# Patient Record
Sex: Female | Born: 1985 | Race: Black or African American | Hispanic: No | Marital: Married | Smoking: Never smoker
Health system: Southern US, Community
[De-identification: ages and names within clinical notes are randomized; demographics above are authoritative.]

## PROBLEM LIST (undated history)

## (undated) DIAGNOSIS — O1493 Unspecified pre-eclampsia, third trimester: Secondary | ICD-10-CM

## (undated) DIAGNOSIS — N83209 Unspecified ovarian cyst, unspecified side: Secondary | ICD-10-CM

## (undated) DIAGNOSIS — N809 Endometriosis, unspecified: Secondary | ICD-10-CM

## (undated) DIAGNOSIS — D259 Leiomyoma of uterus, unspecified: Secondary | ICD-10-CM

---
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
## 2011-03-15 ENCOUNTER — Emergency Department (HOSPITAL_COMMUNITY): Payer: Managed Care, Other (non HMO)

## 2011-03-15 ENCOUNTER — Emergency Department (HOSPITAL_COMMUNITY)
Admission: EM | Admit: 2011-03-15 | Discharge: 2011-03-15 | Disposition: A | Discharge status: Other Acute Inpatient Hospital | Payer: Managed Care, Other (non HMO) | Source: Home / Self Care | Attending: Emergency Medicine | Admitting: Emergency Medicine

## 2011-03-15 ENCOUNTER — Ambulatory Visit (HOSPITAL_COMMUNITY)
Admission: AD | Admit: 2011-03-15 | Discharge: 2011-03-16 | Disposition: A | Discharge status: Home / Self Care | Payer: Managed Care, Other (non HMO) | Source: Ambulatory Visit | Attending: General Surgery | Admitting: General Surgery

## 2011-03-15 ENCOUNTER — Other Ambulatory Visit (INDEPENDENT_AMBULATORY_CARE_PROVIDER_SITE_OTHER): Payer: Self-pay | Admitting: General Surgery

## 2011-03-15 DIAGNOSIS — Z01812 Encounter for preprocedural laboratory examination: Secondary | ICD-10-CM | POA: Insufficient documentation

## 2011-03-15 DIAGNOSIS — K3580 Unspecified acute appendicitis: Secondary | ICD-10-CM | POA: Insufficient documentation

## 2011-03-15 DIAGNOSIS — R109 Unspecified abdominal pain: Secondary | ICD-10-CM

## 2011-03-15 DIAGNOSIS — Z01810 Encounter for preprocedural cardiovascular examination: Secondary | ICD-10-CM | POA: Insufficient documentation

## 2011-03-15 HISTORY — PX: APPENDECTOMY: SHX54

## 2011-03-15 LAB — COMPREHENSIVE METABOLIC PANEL
Albumin: 4.3 g/dL (ref 3.5–5.2)
BUN: 8 mg/dL (ref 6–23)
Calcium: 9.5 mg/dL (ref 8.4–10.5)
Chloride: 104 mEq/L (ref 96–112)
Creatinine, Ser: 0.83 mg/dL (ref 0.50–1.10)
Total Bilirubin: 0.6 mg/dL (ref 0.3–1.2)

## 2011-03-15 LAB — CBC
HCT: 35 % — ABNORMAL LOW (ref 36.0–46.0)
MCH: 29.1 pg (ref 26.0–34.0)
MCHC: 32.3 g/dL (ref 30.0–36.0)
MCV: 90.2 fL (ref 78.0–100.0)
RDW: 12.7 % (ref 11.5–15.5)
WBC: 17.7 10*3/uL — ABNORMAL HIGH (ref 4.0–10.5)

## 2011-03-15 LAB — URINE MICROSCOPIC-ADD ON

## 2011-03-15 LAB — URINALYSIS, ROUTINE W REFLEX MICROSCOPIC
Bilirubin Urine: NEGATIVE
Glucose, UA: NEGATIVE mg/dL
Ketones, ur: 40 mg/dL — AB
Protein, ur: 30 mg/dL — AB
pH: 5.5 (ref 5.0–8.0)

## 2011-03-15 LAB — DIFFERENTIAL
Eosinophils Relative: 0 % (ref 0–5)
Lymphocytes Relative: 5 % — ABNORMAL LOW (ref 12–46)
Lymphs Abs: 0.9 10*3/uL (ref 0.7–4.0)
Monocytes Absolute: 1.2 10*3/uL — ABNORMAL HIGH (ref 0.1–1.0)
Monocytes Relative: 7 % (ref 3–12)

## 2011-03-15 LAB — LIPASE, BLOOD: Lipase: 12 U/L (ref 11–59)

## 2011-03-15 LAB — GC/CHLAMYDIA PROBE AMP, GENITAL: GC Probe Amp, Genital: NEGATIVE

## 2011-03-15 LAB — PREGNANCY, URINE: Preg Test, Ur: NEGATIVE

## 2011-03-15 MED ORDER — IOHEXOL 300 MG/ML  SOLN
100.0000 mL | Freq: Once | INTRAMUSCULAR | Status: AC | PRN
  Administered 2011-03-15: 100 mL via INTRAVENOUS

## 2011-03-15 NOTE — Op Note (Signed)
[name], [name]             ACCOUNT NO.:  [id number]  MEDICAL RECORD NO.:  [id number]  LOCATION:  SDS                          FACILITY:  [facility]  PHYSICIAN:  Cherylynn Ridges, M.D.    DATE OF BIRTH:  1985/12/11  DATE OF PROCEDURE:  03/15/2011 DATE OF DISCHARGE:                              OPERATIVE REPORT   PREOPERATIVE DIAGNOSIS:  Acute appendicitis.  POSTOPERATIVE DIAGNOSIS:  Acute appendicitis.  PROCEDURE:  Laparoscopic appendectomy.  SURGEON:  Cherylynn Ridges, MD  ANESTHESIA:  General endotracheal.  ESTIMATED BLOOD LOSS:  Less than 20 mL.  No complications.  CONDITION:  Stable.  INDICATION FOR OPERATION:  The patient is a 25 year old otherwise healthy female with recent abdominal pain localized to the right lower quadrant who now comes in for a laparoscopic appendectomy.  OPERATION:  The patient was taken to the operating room, placed on table in supine position.  After an adequate general endotracheal anesthetic was administered, she was prepped and draped in usual sterile manner exposing the entire abdomen.  After proper time-out was performed identifying the patient and procedure to be performed, a supraumbilical midline incision was made using #15 blade and taken down to the midline fascia.  We incised the fascia with a 15 blade, then grabbed the edges with Kocher clamps.  We bluntly dissected down into the peritoneal cavity using a Kelly clamp. Once this was done, a pursestring suture of 0 Vicryl was passed around the fascial opening which secured in the Hasson cannula.  Through the Hasson cannula, we insufflated carbon dioxide gas up to a maximal intra- abdominal pressure of 15 mmHg.  Once that was done, a right upper quadrant 5 mm cannula and a left lower quadrant 11-12 mm cannula were passed under direct vision into the peritoneal cavity.  The patient was placed in Trendelenburg, left side was tilted down.  The appendix was actually adhesed to the  anterior abdominal wall from the acute inflammation.  There was also some omental covering of which we dissected away.  We detached the appendix from the base of the cecum using dissect and then came across the base of the appendix using a Endo- GIA 3.5 mm closure stapler.  We then had just a dangling mesoappendix which we came across using a 2.5 mm Endo-GIA.  This completely detached the appendix from the underlying structures.  We retrieved it from the left lower quadrant using the EndoCatch bag.  Once this was done, we inspected the right lower quadrant and irrigated for bleeding and there was none noted.  We aspirated all fluid and gas from above the liver, flattening the patient, then removed all cannulas.  The supraumbilical fascia site was closed using the pursestring suture which was in place.  0.25% Marcaine with epi was injected at all sites. During the processes the surgeon was stuck with a needle and appropriate studies were sent.  After injection, we closed the left lower quadrant and the supraumbilical site using running subcuticular stitch of 4-0 Monocryl. Dermabond, Steri-Strips, and Tegaderm were used to complete all of the dressings.     Cherylynn Ridges, M.D.     JOW/MEDQ  D:  03/15/2011  T:  03/15/2011  Job:  [id number]  Electronically Signed by Jimmye Norman M.D. on 03/15/2011 04:53:22 PM

## 2011-03-18 NOTE — H&P (Signed)
[name], [name]             ACCOUNT NO.:  [id number]  MEDICAL RECORD NO.:  [id number]  LOCATION:  WLED                         FACILITY:  [facility]  PHYSICIAN:  [name], MDDATE OF BIRTH:  09-13-1985  DATE OF ADMISSION:  03/15/2011 DATE OF DISCHARGE:                             HISTORY & PHYSICAL   REFERRING PHYSICIAN:  Sunnie Nielsen, MD, ER.  PRIMARY CARE:  [facility] in [place].  CHIEF COMPLAINT:  Abdominal pain.  BRIEF HISTORY:  The patient is a healthy 25 year old African American female who developed abdominal pain yesterday morning.  She thought it was secondary to onset of her menstrual cycle.  She took some naproxen in the morning.  She went to work and did her usual stuff at school. She came home, was still having pain and took another 800 of ibuprofen. She felt worse, but went to church, but when she got worse and her pain was so bad, she had to go out.  She came home and took more naproxen. Then, she developed nausea and vomiting about 10 p.m., which lasted until 2 a.m.  She also developed some diarrhea yesterday evening.  She also came to the ER around 03:30 this morning.  Workup in the ER shows a white count of 17,700, hemoglobin is 11, hematocrit is 35, platelets of 230,000.  BUN is 8, creatinine is 0.83.  LFTs are normal.  Lipase was 12.  UA showed TNTC RBC.  CT scan shows an appendicolith in the right lower quadrant.  The appendix is dilated and fluid-filled, but there is no abscess.  She also has a 56-mm uterine fibroid.  We are asked to see by the ER for acute appendicitis.  PAST MEDICAL HISTORY:  None.  This is the first time she has ever been in the hospital.  PAST SURGICAL HISTORY:  None.  FAMILY HISTORY:  Both parents and 4 sisters are all living in good health.  SOCIAL HISTORY:  Tobacco, none.  Alcohol, none.  Drugs, none.  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 She is  single.  REVIEW OF SYSTEMS:  FEVER: None.  CV:  No headaches, syncope, dizziness, stroke, or seizure.  SKIN:  No changes.  WEIGHT:  No changes.  She was eating normally until last night.  CARDIAC:  No chest pain or palpitations.  PULMONARY:  No orthopnea, PND, or dyspnea on exertion. GI:  Negative for GERD.  She had some diarrhea last night, but that is now normal.  Positive for nausea and vomiting last night.  She has had some stool with some blood noted in the past, which she attributes to hemorrhoids.  GYN:  She started her cycle yesterday morning with her abdominal pain.  GU:  No trouble voiding.  LOWER EXTREMITIES:  No edema or claudication.  MUSCULOSKELETAL:  Normal.  PSYCH: No changes.  CURRENT MEDICATIONS:  Ibuprofen and Naprosyn p.r.n.  ALLERGIES:  None.  PHYSICAL EXAM:  GENERAL:  This is a well-nourished, well-developed 83- year-old female, in no acute distress.  She is not having any pain currently.  It does hurt when she moves around.  VITAL SIGNS: Temperature is 98.7, heart  rate is 98, blood pressure 121/67, respiratory rate is 18, sats are 98% on room air. HEAD:  Normocephalic. EARS, NOSE, THROAT, AND MOUTH:  Within normal limits.  Trachea is in the midline.  Thyroid is not palpable.  There is no JVD, no bruits. Respiratory effort is normal.  CHEST:  Clear to auscultation and percussion.  CARDIAC:  Normal S1 and S2.  No S3, S4, murmurs, or rubs. EXTREMITIES:  Pulses are +2 in the upper and lower extremities. ABDOMEN:  Bowel sounds are present.  She is not distended.  She is tender in the right lower quadrant, quickly points to that as her only source of discomfort.  Hernias, none.  Masses, none.  Abscesses, none. GU/RECTAL:  Deferred. MUSCULOSKELETAL:  Normal 25 year old. SKIN:  No changes. NEUROLOGIC:  No focal deficits.  Cranial nerves are intact. PSYCH:  Normal affect.  UA was inconclusive because she had TNTC RBC.  WBC is 17.7, hemoglobin 11, hematocrit 35,  platelets 230,000, lipase 12.  Sodium is 139, potassium is 3.7, chloride is 104, CO2 is 25, BUN is 8, creatinine is 0.83, glucose 100.  LFTs are normal.  CT as noted above.  IMPRESSION:  Acute appendicitis.  PLAN:  She has been started on Unasyn.  We will continue that.  We will continue to keep her n.p.o.  We will plan transfer straight to the OR at [facility] for surgery by Dr. Lindie Spruce.  The risks and benefits were discussed in detail and she is agreeable.     Eber Hong, P.A.   ______________________________ Emily Gosling, MD    WDJ/MEDQ  D:  03/15/2011  T:  03/15/2011  Job:  [id number]  cc:   [facility] in [place]  Electronically Signed by Sherrie George P.A. on 03/17/2011 03:07:09 PM Electronically Signed by Emelia Loron MD on 03/18/2011 08:06:53 PM

## 2011-03-20 ENCOUNTER — Encounter (INDEPENDENT_AMBULATORY_CARE_PROVIDER_SITE_OTHER): Payer: Self-pay

## 2011-03-22 ENCOUNTER — Encounter (INDEPENDENT_AMBULATORY_CARE_PROVIDER_SITE_OTHER): Payer: Self-pay

## 2011-04-10 ENCOUNTER — Ambulatory Visit (INDEPENDENT_AMBULATORY_CARE_PROVIDER_SITE_OTHER): Payer: Managed Care, Other (non HMO) | Admitting: General Surgery

## 2011-04-10 ENCOUNTER — Encounter (INDEPENDENT_AMBULATORY_CARE_PROVIDER_SITE_OTHER): Payer: Self-pay | Admitting: General Surgery

## 2011-04-10 VITALS — BP 118/84 | HR 60 | Temp 97.1°F | Resp 12 | Ht 63.0 in | Wt 163.0 lb

## 2011-04-10 DIAGNOSIS — Z09 Encounter for follow-up examination after completed treatment for conditions other than malignant neoplasm: Secondary | ICD-10-CM | POA: Insufficient documentation

## 2011-04-10 NOTE — Progress Notes (Signed)
HPI The patient is doing well status post laparoscopic appendectomy.  PE Her wounds have healed well with no evidence of infection.  Studiy review None  Assessment Doing well status post laparoscopic appendectomy.  Plan Return to me on a p.r.n. basis.

## 2011-05-02 ENCOUNTER — Telehealth (INDEPENDENT_AMBULATORY_CARE_PROVIDER_SITE_OTHER): Payer: Self-pay | Admitting: General Surgery

## 2011-05-02 NOTE — Telephone Encounter (Signed)
Patient status post appendectomy at the end of October, she has been fine but this morning had some sharp abdominal pain in her mid lower abdomen right under her belly button. She also had some nausea and vomited once. Her nausea is gone. She took a vicodin and it helped with pain but still having intermittent sharp pains. She does not have a primary MD. Please advise.

## 2012-08-26 ENCOUNTER — Other Ambulatory Visit: Payer: Self-pay | Admitting: Occupational Medicine

## 2012-08-26 ENCOUNTER — Ambulatory Visit: Payer: Self-pay

## 2012-08-26 DIAGNOSIS — R7612 Nonspecific reaction to cell mediated immunity measurement of gamma interferon antigen response without active tuberculosis: Secondary | ICD-10-CM

## 2012-12-01 ENCOUNTER — Other Ambulatory Visit: Payer: Self-pay | Admitting: Obstetrics and Gynecology

## 2012-12-02 ENCOUNTER — Encounter (HOSPITAL_COMMUNITY): Payer: Self-pay | Admitting: *Deleted

## 2012-12-04 ENCOUNTER — Encounter (HOSPITAL_COMMUNITY): Payer: Self-pay | Admitting: Pharmacy Technician

## 2012-12-08 ENCOUNTER — Other Ambulatory Visit: Payer: Self-pay | Admitting: Obstetrics and Gynecology

## 2012-12-08 DIAGNOSIS — D259 Leiomyoma of uterus, unspecified: Secondary | ICD-10-CM

## 2012-12-08 DIAGNOSIS — N858 Other specified noninflammatory disorders of uterus: Secondary | ICD-10-CM

## 2012-12-08 DIAGNOSIS — N852 Hypertrophy of uterus: Secondary | ICD-10-CM

## 2012-12-10 ENCOUNTER — Ambulatory Visit
Admission: RE | Admit: 2012-12-10 | Discharge: 2012-12-10 | Disposition: A | Discharge status: Home / Self Care | Payer: 59 | Source: Ambulatory Visit | Attending: Obstetrics and Gynecology | Admitting: Obstetrics and Gynecology

## 2012-12-10 DIAGNOSIS — D259 Leiomyoma of uterus, unspecified: Secondary | ICD-10-CM

## 2012-12-10 DIAGNOSIS — N852 Hypertrophy of uterus: Secondary | ICD-10-CM

## 2012-12-10 DIAGNOSIS — N858 Other specified noninflammatory disorders of uterus: Secondary | ICD-10-CM

## 2012-12-10 MED ORDER — GADOBENATE DIMEGLUMINE 529 MG/ML IV SOLN
15.0000 mL | Freq: Once | INTRAVENOUS | Status: AC | PRN
  Administered 2012-12-10: 15 mL via INTRAVENOUS

## 2012-12-11 ENCOUNTER — Ambulatory Visit (HOSPITAL_COMMUNITY)
Admission: RE | Admit: 2012-12-11 | Discharge: 2012-12-12 | Disposition: A | Discharge status: Home / Self Care | Payer: 59 | Source: Ambulatory Visit | Attending: Obstetrics and Gynecology | Admitting: Obstetrics and Gynecology

## 2012-12-11 ENCOUNTER — Encounter (HOSPITAL_COMMUNITY): Payer: Self-pay | Admitting: Anesthesiology

## 2012-12-11 ENCOUNTER — Ambulatory Visit (HOSPITAL_COMMUNITY): Payer: 59 | Admitting: Anesthesiology

## 2012-12-11 ENCOUNTER — Encounter (HOSPITAL_COMMUNITY)
Admission: RE | Disposition: A | Discharge status: Home / Self Care | Payer: Self-pay | Source: Ambulatory Visit | Attending: Obstetrics and Gynecology

## 2012-12-11 DIAGNOSIS — Z9889 Other specified postprocedural states: Secondary | ICD-10-CM

## 2012-12-11 DIAGNOSIS — N803 Endometriosis of pelvic peritoneum, unspecified: Secondary | ICD-10-CM | POA: Insufficient documentation

## 2012-12-11 DIAGNOSIS — N920 Excessive and frequent menstruation with regular cycle: Secondary | ICD-10-CM | POA: Insufficient documentation

## 2012-12-11 DIAGNOSIS — D250 Submucous leiomyoma of uterus: Secondary | ICD-10-CM | POA: Insufficient documentation

## 2012-12-11 DIAGNOSIS — N946 Dysmenorrhea, unspecified: Secondary | ICD-10-CM | POA: Insufficient documentation

## 2012-12-11 DIAGNOSIS — D251 Intramural leiomyoma of uterus: Secondary | ICD-10-CM | POA: Insufficient documentation

## 2012-12-11 HISTORY — DX: Leiomyoma of uterus, unspecified: D25.9

## 2012-12-11 HISTORY — PX: ROBOT ASSISTED MYOMECTOMY: SHX5142

## 2012-12-11 LAB — CBC
MCH: 29.3 pg (ref 26.0–34.0)
Platelets: 248 10*3/uL (ref 150–400)
RBC: 4.34 MIL/uL (ref 3.87–5.11)
RDW: 12.6 % (ref 11.5–15.5)
WBC: 8.7 10*3/uL (ref 4.0–10.5)

## 2012-12-11 LAB — TYPE AND SCREEN: Antibody Screen: NEGATIVE

## 2012-12-11 LAB — PREGNANCY, URINE: Preg Test, Ur: NEGATIVE

## 2012-12-11 LAB — ABO/RH: ABO/RH(D): O POS

## 2012-12-11 SURGERY — ROBOTIC ASSISTED MYOMECTOMY
Anesthesia: General | Site: Abdomen | Wound class: Clean Contaminated

## 2012-12-11 MED ORDER — HYDROMORPHONE HCL PF 1 MG/ML IJ SOLN
0.2000 mg | INTRAMUSCULAR | Status: DC | PRN
  Administered 2012-12-11: 0.6 mg via INTRAVENOUS
  Filled 2012-12-11: qty 1

## 2012-12-11 MED ORDER — DEXTROSE IN LACTATED RINGERS 5 % IV SOLN
INTRAVENOUS | Status: DC
  Administered 2012-12-11: 22:00:00 via INTRAVENOUS

## 2012-12-11 MED ORDER — CEFAZOLIN SODIUM 1-5 GM-% IV SOLN
1.0000 g | Freq: Three times a day (TID) | INTRAVENOUS | Status: AC
  Administered 2012-12-11 – 2012-12-12 (×2): 1 g via INTRAVENOUS
  Filled 2012-12-11 (×2): qty 50

## 2012-12-11 MED ORDER — NEOSTIGMINE METHYLSULFATE 1 MG/ML IJ SOLN
INTRAMUSCULAR | Status: DC | PRN
  Administered 2012-12-11: 4 mg via INTRAVENOUS

## 2012-12-11 MED ORDER — ACETAMINOPHEN 10 MG/ML IV SOLN
1000.0000 mg | Freq: Four times a day (QID) | INTRAVENOUS | Status: DC
  Filled 2012-12-11 (×4): qty 100

## 2012-12-11 MED ORDER — ACETAMINOPHEN 10 MG/ML IV SOLN: 1000.0000 mg | Freq: Four times a day (QID) | INTRAVENOUS | Status: DC

## 2012-12-11 MED ORDER — NEOSTIGMINE METHYLSULFATE 1 MG/ML IJ SOLN
INTRAMUSCULAR | Status: AC
  Filled 2012-12-11: qty 1

## 2012-12-11 MED ORDER — HYDROMORPHONE HCL PF 1 MG/ML IJ SOLN
INTRAMUSCULAR | Status: AC
  Administered 2012-12-11: 0.5 mg via INTRAVENOUS
  Filled 2012-12-11: qty 1

## 2012-12-11 MED ORDER — ACETAMINOPHEN 10 MG/ML IV SOLN
INTRAVENOUS | Status: DC | PRN
  Administered 2012-12-11: 1000 mg via INTRAVENOUS

## 2012-12-11 MED ORDER — KETOROLAC TROMETHAMINE 30 MG/ML IJ SOLN: 15.0000 mg | Freq: Once | INTRAMUSCULAR | Status: DC | PRN

## 2012-12-11 MED ORDER — GLYCOPYRROLATE 0.2 MG/ML IJ SOLN
INTRAMUSCULAR | Status: DC | PRN
  Administered 2012-12-11: 0.6 mg via INTRAVENOUS
  Administered 2012-12-11: 0.1 mg via INTRAVENOUS

## 2012-12-11 MED ORDER — ONDANSETRON HCL 4 MG/2ML IJ SOLN
INTRAMUSCULAR | Status: DC | PRN
  Administered 2012-12-11: 4 mg via INTRAVENOUS

## 2012-12-11 MED ORDER — VASOPRESSIN 20 UNIT/ML IJ SOLN
INTRAMUSCULAR | Status: AC
  Filled 2012-12-11: qty 2

## 2012-12-11 MED ORDER — MEPERIDINE HCL 25 MG/ML IJ SOLN: 6.2500 mg | INTRAMUSCULAR | Status: DC | PRN

## 2012-12-11 MED ORDER — LIDOCAINE HCL (CARDIAC) 20 MG/ML IV SOLN
INTRAVENOUS | Status: AC
  Filled 2012-12-11: qty 5

## 2012-12-11 MED ORDER — MIDAZOLAM HCL 2 MG/2ML IJ SOLN
INTRAMUSCULAR | Status: AC
Start: 2012-12-11 — End: 2012-12-11
  Filled 2012-12-11: qty 2

## 2012-12-11 MED ORDER — ONDANSETRON HCL 4 MG/2ML IJ SOLN
4.0000 mg | Freq: Four times a day (QID) | INTRAMUSCULAR | Status: DC | PRN
  Administered 2012-12-11: 4 mg via INTRAVENOUS
  Filled 2012-12-11: qty 2

## 2012-12-11 MED ORDER — ROCURONIUM BROMIDE 50 MG/5ML IV SOLN
INTRAVENOUS | Status: AC
  Filled 2012-12-11: qty 2

## 2012-12-11 MED ORDER — CEFAZOLIN SODIUM-DEXTROSE 2-3 GM-% IV SOLR
INTRAVENOUS | Status: AC
  Filled 2012-12-11: qty 50

## 2012-12-11 MED ORDER — ZOLPIDEM TARTRATE 5 MG PO TABS: 5.0000 mg | ORAL_TABLET | Freq: Every evening | ORAL | Status: DC | PRN

## 2012-12-11 MED ORDER — PROPOFOL 10 MG/ML IV EMUL
INTRAVENOUS | Status: AC
  Filled 2012-12-11: qty 20

## 2012-12-11 MED ORDER — PHENYLEPHRINE HCL 10 MG/ML IJ SOLN
INTRAMUSCULAR | Status: DC | PRN
  Administered 2012-12-11: 80 ug via INTRAVENOUS
  Administered 2012-12-11: 40 ug via INTRAVENOUS
  Administered 2012-12-11 (×3): 80 ug via INTRAVENOUS
  Administered 2012-12-11: 40 ug via INTRAVENOUS

## 2012-12-11 MED ORDER — ARTIFICIAL TEARS OP OINT
TOPICAL_OINTMENT | OPHTHALMIC | Status: DC | PRN
  Administered 2012-12-11: 1 via OPHTHALMIC

## 2012-12-11 MED ORDER — VASOPRESSIN 20 UNIT/ML IJ SOLN
INTRAMUSCULAR | Status: AC
Start: 2012-12-11 — End: 2012-12-11
  Filled 2012-12-11: qty 2

## 2012-12-11 MED ORDER — PHENYLEPHRINE 40 MCG/ML (10ML) SYRINGE FOR IV PUSH (FOR BLOOD PRESSURE SUPPORT)
PREFILLED_SYRINGE | INTRAVENOUS | Status: AC
  Filled 2012-12-11: qty 5

## 2012-12-11 MED ORDER — DEXAMETHASONE SODIUM PHOSPHATE 10 MG/ML IJ SOLN
INTRAMUSCULAR | Status: AC
  Filled 2012-12-11: qty 1

## 2012-12-11 MED ORDER — BUPIVACAINE HCL (PF) 0.25 % IJ SOLN
INTRAMUSCULAR | Status: DC | PRN
  Administered 2012-12-11: 17 mL

## 2012-12-11 MED ORDER — ONDANSETRON HCL 4 MG/2ML IJ SOLN
INTRAMUSCULAR | Status: AC
  Filled 2012-12-11: qty 2

## 2012-12-11 MED ORDER — FENTANYL CITRATE 0.05 MG/ML IJ SOLN
INTRAMUSCULAR | Status: AC
  Filled 2012-12-11: qty 5

## 2012-12-11 MED ORDER — PROMETHAZINE HCL 25 MG/ML IJ SOLN: 6.2500 mg | INTRAMUSCULAR | Status: DC | PRN

## 2012-12-11 MED ORDER — MENTHOL 3 MG MT LOZG: 1.0000 | LOZENGE | OROMUCOSAL | Status: DC | PRN

## 2012-12-11 MED ORDER — LACTATED RINGERS IV SOLN
INTRAVENOUS | Status: DC
  Administered 2012-12-11 (×4): via INTRAVENOUS

## 2012-12-11 MED ORDER — ROCURONIUM BROMIDE 100 MG/10ML IV SOLN
INTRAVENOUS | Status: DC | PRN
  Administered 2012-12-11: 5 mg via INTRAVENOUS
  Administered 2012-12-11 (×2): 10 mg via INTRAVENOUS
  Administered 2012-12-11: 45 mg via INTRAVENOUS
  Administered 2012-12-11 (×2): 10 mg via INTRAVENOUS

## 2012-12-11 MED ORDER — LACTATED RINGERS IR SOLN
Status: DC | PRN
  Administered 2012-12-11: 3000 mL

## 2012-12-11 MED ORDER — KETOROLAC TROMETHAMINE 30 MG/ML IJ SOLN: 30.0000 mg | Freq: Four times a day (QID) | INTRAMUSCULAR | Status: DC

## 2012-12-11 MED ORDER — GLYCOPYRROLATE 0.2 MG/ML IJ SOLN
INTRAMUSCULAR | Status: AC
  Filled 2012-12-11: qty 3

## 2012-12-11 MED ORDER — PANTOPRAZOLE SODIUM 40 MG PO TBEC
40.0000 mg | DELAYED_RELEASE_TABLET | Freq: Every day | ORAL | Status: DC
  Administered 2012-12-12: 40 mg via ORAL
  Filled 2012-12-11: qty 1

## 2012-12-11 MED ORDER — LIDOCAINE HCL (CARDIAC) 20 MG/ML IV SOLN
INTRAVENOUS | Status: DC | PRN
  Administered 2012-12-11: 80 mg via INTRAVENOUS

## 2012-12-11 MED ORDER — FENTANYL CITRATE 0.05 MG/ML IJ SOLN
INTRAMUSCULAR | Status: DC | PRN
  Administered 2012-12-11: 100 ug via INTRAVENOUS
  Administered 2012-12-11 (×3): 50 ug via INTRAVENOUS

## 2012-12-11 MED ORDER — CEFAZOLIN SODIUM-DEXTROSE 2-3 GM-% IV SOLR
2.0000 g | INTRAVENOUS | Status: AC
  Administered 2012-12-11: 2 g via INTRAVENOUS

## 2012-12-11 MED ORDER — BUPIVACAINE HCL (PF) 0.25 % IJ SOLN
INTRAMUSCULAR | Status: AC
Start: 2012-12-11 — End: 2012-12-11
  Filled 2012-12-11: qty 30

## 2012-12-11 MED ORDER — MIDAZOLAM HCL 5 MG/5ML IJ SOLN
INTRAMUSCULAR | Status: DC | PRN
  Administered 2012-12-11: 2 mg via INTRAVENOUS

## 2012-12-11 MED ORDER — ONDANSETRON HCL 4 MG PO TABS: 4.0000 mg | ORAL_TABLET | Freq: Four times a day (QID) | ORAL | Status: DC | PRN

## 2012-12-11 MED ORDER — VASOPRESSIN 20 UNIT/ML IJ SOLN
INTRAMUSCULAR | Status: DC | PRN
  Administered 2012-12-11: 40 [IU]

## 2012-12-11 MED ORDER — KETOROLAC TROMETHAMINE 30 MG/ML IJ SOLN
30.0000 mg | Freq: Four times a day (QID) | INTRAMUSCULAR | Status: DC
  Administered 2012-12-11 – 2012-12-12 (×2): 30 mg via INTRAVENOUS
  Filled 2012-12-11 (×2): qty 1

## 2012-12-11 MED ORDER — DEXAMETHASONE SODIUM PHOSPHATE 10 MG/ML IJ SOLN
INTRAMUSCULAR | Status: DC | PRN
  Administered 2012-12-11: 10 mg via INTRAVENOUS

## 2012-12-11 MED ORDER — OXYCODONE-ACETAMINOPHEN 5-325 MG PO TABS
1.0000 | ORAL_TABLET | ORAL | Status: DC | PRN
  Administered 2012-12-12 (×2): 1 via ORAL
  Administered 2012-12-12: 2 via ORAL
  Filled 2012-12-11: qty 1
  Filled 2012-12-11: qty 2
  Filled 2012-12-11: qty 1

## 2012-12-11 MED ORDER — HYDROMORPHONE HCL PF 1 MG/ML IJ SOLN
0.2500 mg | INTRAMUSCULAR | Status: DC | PRN
  Administered 2012-12-11 (×2): 0.5 mg via INTRAVENOUS

## 2012-12-11 MED ORDER — PROPOFOL 10 MG/ML IV BOLUS
INTRAVENOUS | Status: DC | PRN
  Administered 2012-12-11: 200 mg via INTRAVENOUS

## 2012-12-11 MED ORDER — IBUPROFEN 800 MG PO TABS
800.0000 mg | ORAL_TABLET | Freq: Three times a day (TID) | ORAL | Status: DC | PRN
  Administered 2012-12-12: 800 mg via ORAL
  Filled 2012-12-11: qty 1

## 2012-12-11 SURGICAL SUPPLY — 60 items
BAG URINE DRAINAGE (UROLOGICAL SUPPLIES) ×2 IMPLANT
BARRIER ADHS 3X4 INTERCEED (GAUZE/BANDAGES/DRESSINGS) ×4 IMPLANT
BLADE LAP MORCELLATOR 15X9.5 (ELECTROSURGICAL) ×2 IMPLANT
CANNULA SEAL DVNC (CANNULA) ×3 IMPLANT
CANNULA SEALS DA VINCI (CANNULA) ×3
CATH FOLEY 3WAY  5CC 16FR (CATHETERS) ×1
CATH FOLEY 3WAY 5CC 16FR (CATHETERS) ×1 IMPLANT
CHLORAPREP W/TINT 26ML (MISCELLANEOUS) ×2 IMPLANT
CONT PATH 16OZ SNAP LID 3702 (MISCELLANEOUS) ×2 IMPLANT
COVER MAYO STAND STRL (DRAPES) ×2 IMPLANT
COVER TABLE BACK 60X90 (DRAPES) ×4 IMPLANT
COVER TIP SHEARS 8 DVNC (MISCELLANEOUS) ×1 IMPLANT
COVER TIP SHEARS 8MM DA VINCI (MISCELLANEOUS) ×1
DECANTER SPIKE VIAL GLASS SM (MISCELLANEOUS) ×2 IMPLANT
DERMABOND ADVANCED (GAUZE/BANDAGES/DRESSINGS) ×1
DERMABOND ADVANCED .7 DNX12 (GAUZE/BANDAGES/DRESSINGS) ×1 IMPLANT
DRAPE HUG U DISPOSABLE (DRAPE) ×2 IMPLANT
DRAPE LG THREE QUARTER DISP (DRAPES) ×4 IMPLANT
DRAPE WARM FLUID 44X44 (DRAPE) ×2 IMPLANT
ELECT REM PT RETURN 9FT ADLT (ELECTROSURGICAL) ×2
ELECTRODE REM PT RTRN 9FT ADLT (ELECTROSURGICAL) ×1 IMPLANT
EVACUATOR SMOKE 8.L (FILTER) ×2 IMPLANT
GAUZE VASELINE 3X9 (GAUZE/BANDAGES/DRESSINGS) IMPLANT
GLOVE BIO SURGEON STRL SZ 6.5 (GLOVE) ×2 IMPLANT
GLOVE BIOGEL PI IND STRL 7.0 (GLOVE) ×2 IMPLANT
GLOVE BIOGEL PI INDICATOR 7.0 (GLOVE) ×2
GOWN STRL REIN XL XLG (GOWN DISPOSABLE) ×12 IMPLANT
KIT ACCESSORY DA VINCI DISP (KITS) ×1
KIT ACCESSORY DVNC DISP (KITS) ×1 IMPLANT
LEGGING LITHOTOMY PAIR STRL (DRAPES) ×2 IMPLANT
MANIPULATOR UTERINE 4.5 ZUMI (MISCELLANEOUS) ×2 IMPLANT
NEEDLE INSUFFLATION 120MM (ENDOMECHANICALS) ×2 IMPLANT
NS IRRIG 1000ML POUR BTL (IV SOLUTION) ×6 IMPLANT
OCCLUDER COLPOPNEUMO (BALLOONS) IMPLANT
PACK LAVH (CUSTOM PROCEDURE TRAY) ×2 IMPLANT
SET CYSTO W/LG BORE CLAMP LF (SET/KITS/TRAYS/PACK) IMPLANT
SET IRRIG TUBING LAPAROSCOPIC (IRRIGATION / IRRIGATOR) ×2 IMPLANT
SOLUTION ELECTROLUBE (MISCELLANEOUS) ×2 IMPLANT
SUT VIC AB 0 CT1 27 (SUTURE) ×5
SUT VIC AB 0 CT1 27XBRD ANBCTR (SUTURE) ×5 IMPLANT
SUT VICRYL 0 UR6 27IN ABS (SUTURE) ×2 IMPLANT
SUT VICRYL RAPIDE 4/0 PS 2 (SUTURE) ×4 IMPLANT
SUT VLOC 180 0 9IN  GS21 (SUTURE) ×1
SUT VLOC 180 0 9IN GS21 (SUTURE) ×1 IMPLANT
SUT VLOC 180 2-0 6IN GS21 (SUTURE) ×4 IMPLANT
SYR 50ML LL SCALE MARK (SYRINGE) ×2 IMPLANT
SYSTEM CONVERTIBLE TROCAR (TROCAR) IMPLANT
TIP UTERINE 5.1X6CM LAV DISP (MISCELLANEOUS) ×2 IMPLANT
TIP UTERINE 6.7X10CM GRN DISP (MISCELLANEOUS) IMPLANT
TIP UTERINE 6.7X6CM WHT DISP (MISCELLANEOUS) IMPLANT
TIP UTERINE 6.7X8CM BLUE DISP (MISCELLANEOUS) IMPLANT
TOWEL OR 17X24 6PK STRL BLUE (TOWEL DISPOSABLE) ×6 IMPLANT
TRAY FOLEY BAG SILVER LF 14FR (CATHETERS) IMPLANT
TROCAR 12M 150ML BLUNT (TROCAR) IMPLANT
TROCAR DISP BLADELESS 8 DVNC (TROCAR) ×1 IMPLANT
TROCAR DISP BLADELESS 8MM (TROCAR) ×1
TROCAR XCEL 12X100 BLDLESS (ENDOMECHANICALS) IMPLANT
TROCAR Z-THREAD 12X150 (TROCAR) ×2 IMPLANT
TUBING FILTER THERMOFLATOR (ELECTROSURGICAL) ×2 IMPLANT
WARMER LAPAROSCOPE (MISCELLANEOUS) ×2 IMPLANT

## 2012-12-11 NOTE — Transfer of Care (Signed)
Immediate Anesthesia Transfer of Care Note  Patient: [name]  Procedure(s) Performed: Procedure(s): ROBOTIC ASSISTED MYOMECTOMY (N/A)  Patient Location: PACU  Anesthesia Type:General  Level of Consciousness: awake, alert  and oriented  Airway & Oxygen Therapy: Patient Spontanous Breathing and Patient connected to nasal cannula oxygen  Post-op Assessment: Report given to PACU RN and Post -op Vital signs reviewed and stable  Post vital signs: Reviewed and stable  Complications: No apparent anesthesia complications

## 2012-12-11 NOTE — Anesthesia Preprocedure Evaluation (Signed)
Anesthesia Evaluation    Reviewed: Allergy & Precautions, H&P , Patient's Chart, lab work & pertinent test results, reviewed documented beta blocker date and time   Airway Mallampati: I TM Distance: >3 FB Neck ROM: full    Dental no notable dental hx. (+) Teeth Intact   Pulmonary neg pulmonary ROS,    Pulmonary exam normal       Cardiovascular negative cardio ROS      Neuro/Psych negative neurological ROS  negative psych ROS   GI/Hepatic negative GI ROS, Neg liver ROS,   Endo/Other  negative endocrine ROS  Renal/GU negative Renal ROS  negative genitourinary   Musculoskeletal negative musculoskeletal ROS (+)   Abdominal   Peds negative pediatric ROS (+)  Hematology negative hematology ROS (+)   Anesthesia Other Findings   Reproductive/Obstetrics negative OB ROS                           Anesthesia Physical Anesthesia Plan  ASA: I  Anesthesia Plan: General   Post-op Pain Management:    Induction: Intravenous  Airway Management Planned: Oral ETT  Additional Equipment:   Intra-op Plan:   Post-operative Plan: Extubation in OR  Informed Consent: I have reviewed the patients History and Physical, chart, labs and discussed the procedure including the risks, benefits and alternatives for the proposed anesthesia with the patient or authorized representative who has indicated his/her understanding and acceptance.   Dental Advisory Given  Plan Discussed with: CRNA and Surgeon  Anesthesia Plan Comments:         Anesthesia Quick Evaluation

## 2012-12-11 NOTE — Anesthesia Procedure Notes (Signed)
Procedure Name: Intubation Date/Time: 12/11/2012 12:53 PM Performed by: Graciela Husbands Pre-anesthesia Checklist: Patient identified, Patient being monitored, Timeout performed, Emergency Drugs available and Suction available Patient Re-evaluated:Patient Re-evaluated prior to inductionOxygen Delivery Method: Circle system utilized Preoxygenation: Pre-oxygenation with 100% oxygen Intubation Type: IV induction Ventilation: Mask ventilation without difficulty Laryngoscope Size: Mac and 3 Grade View: Grade I Tube size: 7.0 mm Number of attempts: 1 Airway Equipment and Method: Stylet Placement Confirmation: ETT inserted through vocal cords under direct vision,  breath sounds checked- equal and bilateral and positive ETCO2 Secured at: 21 cm Tube secured with: Tape Dental Injury: Teeth and Oropharynx as per pre-operative assessment

## 2012-12-11 NOTE — Anesthesia Postprocedure Evaluation (Signed)
  Anesthesia Post-op Note  Patient: [name]  Procedure(s) Performed: Procedure(s): ROBOTIC ASSISTED MYOMECTOMY (N/A)  Patient Location: PACU  Anesthesia Type:General  Level of Consciousness: awake, alert  and oriented  Airway and Oxygen Therapy: Patient Spontanous Breathing  Post-op Pain: mild  Post-op Assessment: Post-op Vital signs reviewed, Patient's Cardiovascular Status Stable, Respiratory Function Stable, Patent Airway, No signs of Nausea or vomiting and Pain level controlled  Post-op Vital Signs: Reviewed and stable  Complications: No apparent anesthesia complications

## 2012-12-11 NOTE — Brief Op Note (Signed)
12/11/2012  6:15 PM  PATIENT:  [name]  27 y.o. female  PRE-OPERATIVE DIAGNOSIS:  Fibroid Uterus, Menorrhagia , dysmenorrhea  POST-OPERATIVE DIAGNOSIS:  Intramural/submucosal fibroids ,menorrhagia, dysmenorrhea, Stage 1 pelvic endometriosis  PROCEDURE:  DaVinci robotic myomectomy  SURGEON:  Surgeon(s) and Role:    * Isaiah Torok Cathie Beams, MD - Primary     PHYSICIAN ASSISTANT:   ASSISTANTS: Noland Fordyce, MD   ANESTHESIA:   general  FINDINGS: LARGE 8-9 CM LUS ANTERIOR FIBROID, 6 cm IM/Submucosal fibroid, nl tubes and ovaries, nl liver edge surg absent appendix. Endometriotic implant noted superior to uterosacral ligaments  EBL:  Total I/O In: 2000 [I.V.:2000] Out: 350 [Urine:150; Blood:200]  BLOOD ADMINISTERED:none  DRAINS: none   LOCAL MEDICATIONS USED:  MARCAINE     SPECIMEN:  Source of Specimen:  myoma( morcellated) 387 grams  DISPOSITION OF SPECIMEN:  PATHOLOGY  COUNTS:  YES  TOURNIQUET:  * No tourniquets in log *  DICTATION: .Other Dictation: Dictation Number [id number]  PLAN OF CARE: Admit for overnight observation  PATIENT DISPOSITION:  PACU - hemodynamically stable.   Delay start of Pharmacological VTE agent (>24hrs) due to surgical blood loss or risk of bleeding: no

## 2012-12-12 ENCOUNTER — Other Ambulatory Visit: Payer: Self-pay

## 2012-12-12 ENCOUNTER — Encounter (HOSPITAL_COMMUNITY): Payer: Self-pay | Admitting: Obstetrics and Gynecology

## 2012-12-12 DIAGNOSIS — Z9889 Other specified postprocedural states: Secondary | ICD-10-CM

## 2012-12-12 LAB — CBC
HCT: 32.7 % — ABNORMAL LOW (ref 36.0–46.0)
RBC: 3.7 MIL/uL — ABNORMAL LOW (ref 3.87–5.11)
RDW: 12.6 % (ref 11.5–15.5)
WBC: 19.2 10*3/uL — ABNORMAL HIGH (ref 4.0–10.5)

## 2012-12-12 MED ORDER — IBUPROFEN 800 MG PO TABS: 800.0000 mg | ORAL_TABLET | Freq: Three times a day (TID) | ORAL | Status: DC | PRN

## 2012-12-12 MED ORDER — ONDANSETRON HCL 4 MG/2ML IJ SOLN: 4.0000 mg | Freq: Four times a day (QID) | INTRAMUSCULAR | Status: DC | PRN

## 2012-12-12 MED ORDER — OXYCODONE-ACETAMINOPHEN 5-325 MG PO TABS: 1.0000 | ORAL_TABLET | ORAL | Status: DC | PRN

## 2012-12-12 NOTE — Anesthesia Postprocedure Evaluation (Signed)
Anesthesia Post Note  Patient: [name]  Procedure(s) Performed: Procedure(s) (LRB): ROBOTIC ASSISTED MYOMECTOMY (N/A)  Anesthesia type: General  Patient location: Mother/Baby  Post pain: Pain level controlled  Post assessment: Post-op Vital signs reviewed  Last Vitals:  Filed Vitals:   12/12/12 0515  BP: 95/55  Pulse: 68  Temp: 36.9 C  Resp: 18    Post vital signs: Reviewed  Level of consciousness: awake and alert   Complications: No apparent anesthesia complications

## 2012-12-12 NOTE — Progress Notes (Signed)
Discharge instructions provided to patient at bedside.  Activity, follow up appointments, medications, when to call the doctor and community resources discussed.  No questions at this time.  Patient left unit in stable condition with all personal belongings and prescriptions.  Osvaldo Angst, RN----------

## 2012-12-12 NOTE — Discharge Summary (Signed)
Physician Discharge Summary  Patient ID: [name] MRN: [id number] DOB/AGE: 1986-05-18 26 y.o.  Admit date: 12/11/2012 Discharge date: 12/12/2012  Admission Diagnoses: menorrhagia, uterine fibroids, dysmenorrhea  Discharge Diagnoses: menorrhagia, IM/submucosal fibroids, stage 1 pelvic endometriosis, dysmenorrhea   Active Problems:   S/P myomectomy   Discharged Condition: stable  Hospital Course: pt was admitted to [facility] and underwent DaVinci robotic myomectomy. She had an UNCOMPLICATED postop course  Consults: None  Significant Diagnostic Studies: labs:  CBC    Component Value Date/Time   WBC 19.2* 12/12/2012 0520   RBC 3.70* 12/12/2012 0520   HGB 10.9* 12/12/2012 0520   HCT 32.7* 12/12/2012 0520   PLT 212 12/12/2012 0520   MCV 88.4 12/12/2012 0520   MCH 29.5 12/12/2012 0520   MCHC 33.3 12/12/2012 0520   RDW 12.6 12/12/2012 0520   LYMPHSABS 0.9 03/15/2011 0445   MONOABS 1.2* 03/15/2011 0445   EOSABS 0.0 03/15/2011 0445   BASOSABS 0.0 03/15/2011 0445      Treatments: surgery: Davinci robotic myomectomy  Discharge Exam: Blood pressure 95/55, pulse 68, temperature 98.4 F (36.9 C), temperature source Oral, resp. rate 18, height 5\' 2"  (1.575 m), weight 72.576 kg (160 lb), last menstrual period 11/28/2012, SpO2 98.00%. General appearance: alert, cooperative and no distress Resp: clear to auscultation bilaterally Cardio: regular rate and rhythm, S1, S2 normal, no murmur, click, rub or gallop GI: soft, non-tender; bowel sounds normal; no masses,  no organomegaly Pelvic: deferred Extremities: no edema, redness or tenderness in the calves or thighs Incisions well approximated, clean/dry/intact  Disposition: 01-Home or Self Care  Discharge Orders   Future Orders Complete By Expires     Diet general  As directed     Discharge instructions  As directed     Comments:      CALL  IF TEMP>100.4, NOTHING PER VAGINA X 2 WK, CALL IF SOAKING A MAXI  PAD EVERY HOUR OR MORE  FREQUENTLY    Discharge patient  As directed     May shower / Bathe  As directed     May walk up steps  As directed         Medication List         ibuprofen 800 MG tablet  Commonly known as:  ADVIL,MOTRIN  Take 1 tablet (800 mg total) by mouth every 8 (eight) hours as needed (mild pain).     nystatin ointment  Commonly known as:  MYCOSTATIN  Apply 1 application topically 2 (two) times daily. itchimg     ondansetron 4 MG/2ML Soln injection  Commonly known as:  ZOFRAN  Inject 2 mLs (4 mg total) into the vein every 6 (six) hours as needed for nausea.     Vitamin D (Ergocalciferol) 50000 UNITS Caps  Commonly known as:  DRISDOL  Take 50,000 Units by mouth every 7 (seven) days.           Follow-up Information   Follow up with Nanna Ertle A, MD In 2 weeks.   Contact information:   [address] [place] [address] [phone]       Signed: Florencio Hollibaugh A 12/12/2012, 8:05 AM

## 2012-12-12 NOTE — Progress Notes (Signed)
Subjective: Patient reports tolerating PO and no problems voiding.    Objective: I have reviewed patient's vital signs.  vital signs, intake and output and medications. Filed Vitals:   12/12/12 0515  BP: 95/55  Pulse: 68  Temp: 98.4 F (36.9 C)  Resp: 18   I/O last 3 completed shifts: In: 2880 [P.O.:630; I.V.:2250] Out: 2800 [Urine:2600; Blood:200] Total I/O In: -  Out: 250 [Urine:250]  Lab Results  Component Value Date   WBC 19.2* 12/12/2012   HGB 10.9* 12/12/2012   HCT 32.7* 12/12/2012   MCV 88.4 12/12/2012   PLT 212 12/12/2012   Lab Results  Component Value Date   CREATININE 0.83 03/15/2011    EXAM General: alert, cooperative and no distress Resp: clear to auscultation bilaterally Cardio: regular rate and rhythm, S1, S2 normal, no murmur, click, rub or gallop GI: soft, non-tender; bowel sounds normal; no masses,  no organomegaly and incision: clean, dry and WELL APPROXIMATED Extremities: no edema, redness or tenderness in the calves or thighs Vaginal Bleeding: none  Assessment: s/p Procedure(s):DAVINCI ROBOTIC ASSISTED MYOMECTOMY: stable, progressing well and tolerating diet  Plan: Advance diet Encourage ambulation Discontinue IV fluids Discharge home  LOS: 1 day  F/U 2 WEEKS  Prathik Aman A, MD 12/12/2012 8:02 AM    12/12/2012, 8:02 AM

## 2012-12-12 NOTE — Op Note (Signed)
NAMESIMAYA, [name]             ACCOUNT NO.:  [id number]  MEDICAL RECORD NO.:  [id number]  LOCATION:  9312                          FACILITY:  [facility]  PHYSICIAN:  Maxie Better, M.D.DATE OF BIRTH:  05/14/1986  DATE OF PROCEDURE:  12/11/2012 DATE OF DISCHARGE:                              OPERATIVE REPORT   PREOPERATIVE DIAGNOSIS:  Menorrhagia, uterine fibroids, dysmenorrhea.  PROCEDURES:  da-Vinci robotic assisted myomectomy.  POSTOPERATIVE DIAGNOSIS:  Intramural/submucosal fibroids, menorrhagia, Dysmenorrhea, Stage 1 pelvic endometriosis  ANESTHESIA:  General.  SURGEON:  Maxie Better, MD  ASSISTANT:  Alphonsus Sias. Ernestina Penna, MD  DESCRIPTION OF PROCEDURE:  Under adequate general anesthesia, the patient was placed in the dorsal lithotomy position.  Examination under anesthesia had revealed a 13-14 week size uterus.  Adnexa was not palpable.  The patient was sterilely prepped and draped in the fashion. A three-way Foley catheter was sterilely placed.  Retractors were placed in the vagina.  The anterior lip of the cervix was grasped with a ring clamp.  Uterus sounded to 7 cm.  A #6 uterine manipulator was introduced into the uterine cavity x2, but the balloon on insufflation would rupture.  At that point, Zumi uterine manipulator was inserted.  The retractors were removed.  Attention was then turned to the abdomen.  A 0.25% Marcaine was injected infraumbilically.  Infraumbilical vertical incision was made and Veress needle was introduced, tested, and 2 L of CO2 was insufflated.  Veress needle was then removed.  A 12 mm disposable trocar was introduced into the abdomen without incident.  The robotic camera was then placed through that port.  The patient was then placed in deep Trendelenburg position.  On entering the abdomen, panoramic inspection was noted.  Normal liver edge was noted. Gallbladder was seen.  Uterus which was retroverted, had a large fibroid about 9 cm  which was visualized.  The rest of the anatomy was limited because of the retroflexed uterus.  The left ovary and tube were noted. The right tube and ovary was not initially seen.  The ureter on the right was easily seen peristalsing.  Two 8 mm robotic port sites were then placed on the left and 1 robotic port site of 8 mm in the right and a right lower quadrant 12 mm assistant port was then placed.  Once these were done, the central docking was performed.  A dilute solution of Pitressin was injected through the abdomen with a spinal needle and injected overlying serosa of the fibroid.  The arm #1, had the permanent cautery hook placed; arm #2, with a PK; arm #3, with a ProGrasp.  I then went to the surgical Console. At the surgical console, additional Pitressin was then directly injected overlying this large fibroid.  The manipulation of the uterus was not able to be performed due to the large fibroid. Nonetheless, it was noted that the fundus was retroverted, both ovaries were normal.  Both tubes were normal.  The posterior cul-de-sac was not easily seen.  The procedure was started with a transverse incision overlying serosa of the fibroid and dissection with both the PK and the permanent hook, the large fibroid was subsequently enucleated from its base.  At that  point, it was then noted that another fibroid was superior and below the removed fibroid.  The large fibroid which was removed was the lower uterine segment fibroid intramural location.  Additional dilute Pitressin was then injected.  The fibroid surface was then opened with cautery, and again that fibroid was enucleated with traction and counter traction, using the permanent hook in the cautery.  Throughout the case, intermittently the robotic tenaculum was also placed in the third arm.  Once both fibroids were then removed, the uterus was able to be more manipulated particularly after the first fibroid. There was a focal area  of endometriosis noted in between the uterosacral ligaments.  No other lesions were noted.  The permanent hook was then used to cauterize the small area of endometriosis that was seen.  The permanent hook and the PK was replaced by the long-tipped forceps and the large suture needle driver and using 2-0 V-Loc suture, the part of the defect was then approximated followed by 0 V-Loc suture until the surface was reached. There was redundant tissue overlying that, but it was not large amount enough to require removal and it was left alone. 2-0 V-Loc suture was then used to close this serosal surface. Bleeding was noted on the right side of that defect and 0 Vicryl figure-of-eight suture x2 was placed with good hemostasis noted.  The pelvis was irrigated and suctioned.  The procedure at that point felt to be completed.  The small bleeding along the area of the endometriosis site was cauterized.  The robotic instruments were removed.  The robot was undocked. I went back to the patient's bedside. The assistant port was replaced by the Storz morcellator and the specimens were brought down into the field and morcellated x2.  The morcellated pieces were removed.  The abdomen was irrigated and suctioned.  Very minimal small pieces were noted and they were all removed.  At that point, the procedure was completed as well.  Interceed was placed posteriorly and anteriorly overlying these area of removal of the fibroid.  Once all was cleared, the robotic ports were removed under direct visualization.  The abdomen was circumferentially inspected and irrigated fluid was removed from the gutters bilaterally.  The uterine manipulator had come out during the case and therefore was not reinserted.  The incisions were closed with 0 Vicryl figure-of-eight suture in the fascial stitch for both the umbilical and right lower quadrant sites and all sites were then closed with 4-0 Vicryl subcuticular stitches.   Specimen was the uterine fibroids morcellated, weighing 387 g.  ESTIMATED BLOOD LOSS:  200 mL.  INTRAOPERATIVE FLUID:  2 L.  URINE OUTPUT:  150 mL clear urine.  Sponge instrument counts x2 was correct.  COMPLICATIONS:  None.  The patient tolerated the procedure well, was transferred to recovery in stable condition.     Maxie Better, M.D.     [place]/MEDQ  D:  12/11/2012  T:  12/12/2012  Job:  [id number]

## 2013-03-26 ENCOUNTER — Other Ambulatory Visit: Payer: Self-pay

## 2015-12-29 DIAGNOSIS — Z113 Encounter for screening for infections with a predominantly sexual mode of transmission: Secondary | ICD-10-CM | POA: Diagnosis not present

## 2015-12-29 DIAGNOSIS — Z01419 Encounter for gynecological examination (general) (routine) without abnormal findings: Secondary | ICD-10-CM | POA: Diagnosis not present

## 2015-12-29 DIAGNOSIS — B373 Candidiasis of vulva and vagina: Secondary | ICD-10-CM | POA: Diagnosis not present

## 2016-03-28 ENCOUNTER — Emergency Department
Admission: EM | Admit: 2016-03-28 | Discharge: 2016-03-28 | Disposition: A | Discharge status: Home / Self Care | Payer: 59 | Source: Home / Self Care | Attending: Family Medicine | Admitting: Family Medicine

## 2016-03-28 ENCOUNTER — Encounter: Payer: Self-pay | Admitting: *Deleted

## 2016-03-28 DIAGNOSIS — R1030 Lower abdominal pain, unspecified: Secondary | ICD-10-CM | POA: Diagnosis not present

## 2016-03-28 DIAGNOSIS — K921 Melena: Secondary | ICD-10-CM

## 2016-03-28 DIAGNOSIS — R109 Unspecified abdominal pain: Secondary | ICD-10-CM | POA: Diagnosis not present

## 2016-03-28 LAB — POCT URINALYSIS DIP (MANUAL ENTRY)
Bilirubin, UA: NEGATIVE
Blood, UA: NEGATIVE
Glucose, UA: NEGATIVE
Ketones, POC UA: NEGATIVE
Leukocytes, UA: NEGATIVE
Nitrite, UA: NEGATIVE
Protein Ur, POC: NEGATIVE
Spec Grav, UA: 1.01 (ref 1.005–1.03)
Urobilinogen, UA: 0.2 (ref 0–1)
pH, UA: 6 (ref 5–8)

## 2016-03-28 LAB — POCT CBC W AUTO DIFF (K'VILLE URGENT CARE)

## 2016-03-28 LAB — POCT URINE PREGNANCY: Preg Test, Ur: NEGATIVE

## 2016-03-28 MED ORDER — KETOROLAC TROMETHAMINE 60 MG/2ML IM SOLN
60.0000 mg | Freq: Once | INTRAMUSCULAR | Status: AC
  Administered 2016-03-28: 60 mg via INTRAMUSCULAR

## 2016-03-28 NOTE — ED Triage Notes (Signed)
Pt c/o middle lower abd pain x 1 day. Today she reports bright red blood in stool and fatigue.

## 2016-03-28 NOTE — ED Provider Notes (Signed)
CSN: [id number]     Arrival date & time 03/28/16  1933 History   First MD Initiated Contact with Patient 03/28/16 1946     Chief Complaint  Patient presents with  . Abdominal Pain   (Consider location/radiation/quality/duration/timing/severity/associated sxs/prior Treatment) HPI [name] is a 30 y.o. female presenting to UC with mother c/o lower abdominal pain that started yesterday, then went away but has been constant today with associated blood in toilet after having a bowel movement as well as generalized weakness/fatigue.  Pain is 5/10, worst over her bladder.  She has not tried anything for pain today but notes she tries to only take ibuprofen as needed for menstrual cramps. LMP 03/08/16. Normal per pt.  Denies fever, chills, n/v/d. Hx of appendectomy several years ago. Hx of fibroid but notes she had a vaginal U/S only done due to heavy bleeding, not due to pain.  Denies dysuria, hematuria, or vaginal discharge or bleeding.   Past Medical History:  Diagnosis Date  . Uterine fibroid    Past Surgical History:  Procedure Laterality Date  . APPENDECTOMY  03/15/11  . ROBOT ASSISTED MYOMECTOMY N/A 12/11/2012   Procedure: ROBOTIC ASSISTED MYOMECTOMY;  Surgeon: Marvene Staff, MD;  Location: [facility];  Service: Gynecology;  Laterality: N/A;   Family History  Problem Relation Age of Onset  . Cancer Maternal Aunt     pt unaware of what kind  . Cancer Maternal Grandmother     pt unawaure of what kinf   Social History  Substance Use Topics  . Smoking status: Never Smoker  . Smokeless tobacco: Never Used  . Alcohol use No   OB History    No data available     Review of Systems  Constitutional: Positive for fatigue. Negative for appetite change, chills and fever.  Respiratory: Negative for cough, chest tightness and shortness of breath.   Cardiovascular: Negative for chest pain and palpitations.  Gastrointestinal: Positive for abdominal pain and blood in stool.  Negative for constipation, diarrhea, nausea, rectal pain and vomiting.  Genitourinary: Negative for decreased urine volume, dysuria, flank pain, frequency, hematuria, pelvic pain, urgency, vaginal bleeding, vaginal discharge and vaginal pain.  Musculoskeletal: Negative for back pain and myalgias.  Neurological: Positive for weakness ( generalized). Negative for syncope and numbness.    Allergies  Patient has no known allergies.  Home Medications   Prior to Admission medications   Medication Sig Start Date End Date Taking? Authorizing Provider  ibuprofen (ADVIL,MOTRIN) 800 MG tablet Take 1 tablet (800 mg total) by mouth every 8 (eight) hours as needed (mild pain). 12/12/12   Servando Salina, MD  Vitamin D, Ergocalciferol, (DRISDOL) 50000 UNITS CAPS Take 50,000 Units by mouth every 7 (seven) days.    Historical Provider, MD   Meds Ordered and Administered this Visit   Medications  ketorolac (TORADOL) injection 60 mg (60 mg Intramuscular Given 03/28/16 2015)    BP 135/87 (BP Location: Left Arm)   Pulse 97   Temp 98.3 F (36.8 C) (Oral)   Resp 16   Ht 5\' 3"  (1.6 m)   Wt 186 lb (84.4 kg)   LMP 03/08/2016   SpO2 100%   BMI 32.95 kg/m  No data found.   Physical Exam  Constitutional: She is oriented to person, place, and time. She appears well-developed and well-nourished. No distress.  Pt sitting comfortably on exam bed, NAD  HENT:  Head: Normocephalic and atraumatic.  Eyes: EOM are normal.  Neck: Normal range of motion.  Cardiovascular: Normal rate and regular rhythm.   Pulmonary/Chest: Effort normal and breath sounds normal. No respiratory distress. She has no wheezes. She has no rales.  Abdominal: Soft. She exhibits no distension and no mass. There is tenderness. There is no rebound and no guarding.    Soft, non-distended. Tenderness to lower abdomen and left mid abdomen. No rebound or guarding. No CVA tenderness.  Musculoskeletal: Normal range of motion.  Neurological:  She is alert and oriented to person, place, and time.  Skin: Skin is warm and dry. She is not diaphoretic.  Psychiatric: She has a normal mood and affect. Her behavior is normal.  Nursing note and vitals reviewed.   Urgent Care Course   Clinical Course     Procedures (including critical care time)  Labs Review Labs Reviewed  COMPLETE METABOLIC PANEL WITH GFR   Narrative:    Performed at:  [facility]                [id number]                [place], [place] [address]  POCT CBC W AUTO DIFF ([facility])  POCT URINALYSIS DIP (MANUAL ENTRY)  POCT URINE PREGNANCY    Imaging Review No results found.   MDM   1. Lower abdominal pain   2. Blood in stool    Pt c/o moderate to severe sharp lower to mid-left abdominal pain, blood in stool and generalized weakness since yesterday. CBC: WBC- 13.6 Abd- soft, non-distended. Tenderness to lower and mid-left abdomen w/o rebound or guarding. Pt has had an appendectomy. Possible hx of uterine fibroids. Denies vaginal symptoms. Pt hesitant to go to emergency department due to co-pay cost.  CT abdomen/pelvis ordered for tomorrow morning at [facility].  Advised pt to call 911 or go to emergency department this evening if symptoms worsening. Mother and pt agreeable with plan.   Noland Fordyce, PA-C 03/29/16 [phone]

## 2016-03-29 ENCOUNTER — Telehealth: Payer: Self-pay | Admitting: Emergency Medicine

## 2016-03-29 ENCOUNTER — Ambulatory Visit (INDEPENDENT_AMBULATORY_CARE_PROVIDER_SITE_OTHER): Payer: 59

## 2016-03-29 DIAGNOSIS — R1030 Lower abdominal pain, unspecified: Secondary | ICD-10-CM

## 2016-03-29 DIAGNOSIS — Z9049 Acquired absence of other specified parts of digestive tract: Secondary | ICD-10-CM

## 2016-03-29 DIAGNOSIS — K921 Melena: Secondary | ICD-10-CM | POA: Diagnosis not present

## 2016-03-29 LAB — COMPLETE METABOLIC PANEL WITH GFR
ALT: 17 U/L (ref 6–29)
AST: 25 U/L (ref 10–30)
Albumin: 4.7 g/dL (ref 3.6–5.1)
Alkaline Phosphatase: 53 U/L (ref 33–115)
BUN: 9 mg/dL (ref 7–25)
CO2: 26 mmol/L (ref 20–31)
Calcium: 9.9 mg/dL (ref 8.6–10.2)
Chloride: 101 mmol/L (ref 98–110)
Creat: 0.74 mg/dL (ref 0.50–1.10)
GFR, Est African American: >89 mL/min (ref >=60)
GFR, Est Non African American: >89 mL/min (ref >=60)
Glucose, Bld: 96 mg/dL (ref 65–99)
Potassium: 4.1 mmol/L (ref 3.5–5.3)
Sodium: 136 mmol/L (ref 135–146)
Total Bilirubin: 0.3 mg/dL (ref 0.2–1.2)
Total Protein: 7.5 g/dL (ref 6.1–8.1)

## 2016-03-29 MED ORDER — TRAMADOL HCL 50 MG PO TABS: 50.0000 mg | ORAL_TABLET | Freq: Four times a day (QID) | ORAL | 0 refills | Status: DC | PRN

## 2016-03-29 MED ORDER — IOPAMIDOL (ISOVUE-300) INJECTION 61%
100.0000 mL | Freq: Once | INTRAVENOUS | Status: AC | PRN
  Administered 2016-03-29: 100 mL via INTRAVENOUS

## 2016-03-29 NOTE — Telephone Encounter (Signed)
Pt came back to [facility] for CT abdomen/pelvis. Results given to pt.   CT abd/pelvis c/w Left ovarian cyst as well as free fluid in pelvis. This is likely the cause of pt's pain/discomfort. Encouraged to follow up with PCP and GYN for continued monitoring and management of symptoms. Rx: Tramadol. Prescription hand written as it would not print.

## 2016-04-02 ENCOUNTER — Other Ambulatory Visit (HOSPITAL_BASED_OUTPATIENT_CLINIC_OR_DEPARTMENT_OTHER): Payer: 59

## 2016-04-02 ENCOUNTER — Encounter: Payer: Self-pay | Admitting: Family Medicine

## 2016-04-02 ENCOUNTER — Ambulatory Visit (HOSPITAL_BASED_OUTPATIENT_CLINIC_OR_DEPARTMENT_OTHER)
Admission: RE | Admit: 2016-04-02 | Discharge: 2016-04-02 | Disposition: A | Discharge status: Home / Self Care | Payer: 59 | Source: Ambulatory Visit | Attending: Family Medicine | Admitting: Family Medicine

## 2016-04-02 ENCOUNTER — Ambulatory Visit (INDEPENDENT_AMBULATORY_CARE_PROVIDER_SITE_OTHER): Payer: 59 | Admitting: Family Medicine

## 2016-04-02 VITALS — BP 114/78 | HR 90 | Ht 63.0 in | Wt 183.0 lb

## 2016-04-02 DIAGNOSIS — N809 Endometriosis, unspecified: Secondary | ICD-10-CM | POA: Diagnosis not present

## 2016-04-02 DIAGNOSIS — N83202 Unspecified ovarian cyst, left side: Secondary | ICD-10-CM

## 2016-04-02 DIAGNOSIS — N83209 Unspecified ovarian cyst, unspecified side: Secondary | ICD-10-CM | POA: Insufficient documentation

## 2016-04-02 DIAGNOSIS — D219 Benign neoplasm of connective and other soft tissue, unspecified: Secondary | ICD-10-CM | POA: Insufficient documentation

## 2016-04-02 DIAGNOSIS — N854 Malposition of uterus: Secondary | ICD-10-CM | POA: Diagnosis not present

## 2016-04-02 DIAGNOSIS — R1032 Left lower quadrant pain: Secondary | ICD-10-CM | POA: Insufficient documentation

## 2016-04-02 DIAGNOSIS — D251 Intramural leiomyoma of uterus: Secondary | ICD-10-CM | POA: Insufficient documentation

## 2016-04-02 HISTORY — DX: Benign neoplasm of connective and other soft tissue, unspecified: D21.9

## 2016-04-02 NOTE — Assessment & Plan Note (Signed)
Seen on CT--will get u/s to further classify. Possible rupture, may be cause of pain.

## 2016-04-02 NOTE — Patient Instructions (Signed)
Ovarian Cyst An ovarian cyst is a fluid-filled sac that forms on an ovary. The ovaries are small organs that produce eggs in women. Various types of cysts can form on the ovaries. Most are not cancerous. Many do not cause problems, and they often go away on their own. Some may cause symptoms and require treatment. Common types of ovarian cysts include:  Functional cysts--These cysts may occur every month during the menstrual cycle. This is normal. The cysts usually go away with the next menstrual cycle if the woman does not get pregnant. Usually, there are no symptoms with a functional cyst.  Endometrioma cysts--These cysts form from the tissue that lines the uterus. They are also called "chocolate cysts" because they become filled with blood that turns brown. This type of cyst can cause pain in the lower abdomen during intercourse and with your menstrual period.  Cystadenoma cysts--This type develops from the cells on the outside of the ovary. These cysts can get very big and cause lower abdomen pain and pain with intercourse. This type of cyst can twist on itself, cut off its blood supply, and cause severe pain. It can also easily rupture and cause a lot of pain.  Dermoid cysts--This type of cyst is sometimes found in both ovaries. These cysts may contain different kinds of body tissue, such as skin, teeth, hair, or cartilage. They usually do not cause symptoms unless they get very big.  Theca lutein cysts--These cysts occur when too much of a certain hormone (human chorionic gonadotropin) is produced and overstimulates the ovaries to produce an egg. This is most common after procedures used to assist with the conception of a baby (in vitro fertilization). CAUSES   Fertility drugs can cause a condition in which multiple large cysts are formed on the ovaries. This is called ovarian hyperstimulation syndrome.  A condition called polycystic ovary syndrome can cause hormonal imbalances that can lead to  nonfunctional ovarian cysts. SIGNS AND SYMPTOMS  Many ovarian cysts do not cause symptoms. If symptoms are present, they may include:  Pelvic pain or pressure.  Pain in the lower abdomen.  Pain during sexual intercourse.  Increasing girth (swelling) of the abdomen.  Abnormal menstrual periods.  Increasing pain with menstrual periods.  Stopping having menstrual periods without being pregnant. DIAGNOSIS  These cysts are commonly found during a routine or annual pelvic exam. Tests may be ordered to find out more about the cyst. These tests may include:  Ultrasound.  X-ray of the pelvis.  CT scan.  MRI.  Blood tests. TREATMENT  Many ovarian cysts go away on their own without treatment. Your health care provider may want to check your cyst regularly for 2-3 months to see if it changes. For women in menopause, it is particularly important to monitor a cyst closely because of the higher rate of ovarian cancer in menopausal women. When treatment is needed, it may include any of the following:  A procedure to drain the cyst (aspiration). This may be done using a long needle and ultrasound. It can also be done through a laparoscopic procedure. This involves using a thin, lighted tube with a tiny camera on the end (laparoscope) inserted through a small incision.  Surgery to remove the whole cyst. This may be done using laparoscopic surgery or an open surgery involving a larger incision in the lower abdomen.  Hormone treatment or birth control pills. These methods are sometimes used to help dissolve a cyst. HOME CARE INSTRUCTIONS   Only take over-the-counter   or prescription medicines as directed by your health care provider.  Follow up with your health care provider as directed.  Get regular pelvic exams and Pap tests. SEEK MEDICAL CARE IF:   Your periods are late, irregular, or painful, or they stop.  Your pelvic pain or abdominal pain does not go away.  Your abdomen becomes  larger or swollen.  You have pressure on your bladder or trouble emptying your bladder completely.  You have pain during sexual intercourse.  You have feelings of fullness, pressure, or discomfort in your stomach.  You lose weight for no apparent reason.  You feel generally ill.  You become constipated.  You lose your appetite.  You develop acne.  You have an increase in body and facial hair.  You are gaining weight, without changing your exercise and eating habits.  You think you are pregnant. SEEK IMMEDIATE MEDICAL CARE IF:   You have increasing abdominal pain.  You feel sick to your stomach (nauseous), and you throw up (vomit).  You develop a fever that comes on suddenly.  You have abdominal pain during a bowel movement.  Your menstrual periods become heavier than usual. MAKE SURE YOU:  Understand these instructions.  Will watch your condition.  Will get help right away if you are not doing well or get worse.   This information is not intended to replace advice given to you by your health care provider. Make sure you discuss any questions you have with your health care provider.   Document Released: 05/07/2005 Document Revised: 05/12/2013 Document Reviewed: 01/12/2013 Elsevier Interactive Patient Education 2016 [employer]  

## 2016-04-02 NOTE — Assessment & Plan Note (Signed)
Likely related to cyst--check U/S

## 2016-04-02 NOTE — Progress Notes (Signed)
   Subjective:    Patient ID: [name] is a 30 y.o. female presenting with No chief complaint on file.  on 04/02/2016  HPI: Reports 6 day h/o pain. Began 5-10 mins of pain. Worsened the next day and she developed some rectal bleeding. Pain became constant. Seen in UC and received an CT which showed left sided pain.  Has h/o fibroids and h/o myomectomy and endometriosis that was burned off by Dr. Garwin Brothers. Still having some pain but it is now better and notes pain is off and on but less.  Review of Systems  Constitutional: Negative for chills and fever.  Respiratory: Negative for shortness of breath.   Cardiovascular: Negative for chest pain.  Gastrointestinal: Negative for abdominal pain, nausea and vomiting.  Genitourinary: Negative for dysuria.  Skin: Negative for rash.      Objective:    BP 114/78 (BP Location: Left Arm, Patient Position: Sitting)   Pulse 90   Ht 5\' 3"  (1.6 m)   Wt 183 lb (83 kg)   LMP 03/08/2016   BMI 32.42 kg/m  Physical Exam  Constitutional: She is oriented to person, place, and time. She appears well-developed and well-nourished. No distress.  HENT:  Head: Normocephalic and atraumatic.  Eyes: No scleral icterus.  Neck: Neck supple.  Cardiovascular: Normal rate.   Pulmonary/Chest: Effort normal.  Abdominal: Soft.  Neurological: She is alert and oriented to person, place, and time.  Skin: Skin is warm and dry.  Psychiatric: She has a normal mood and affect.        Assessment & Plan:   Problem List Items Addressed This Visit      Unprioritized   LLQ pain    Likely related to cyst--check U/S      Ovarian cyst - Primary    Seen on CT--will get u/s to further classify. Possible rupture, may be cause of pain.      Relevant Orders   US Pelvis Complete   US Transvaginal Non-OB      Total face-to-face time with patient: 20 minutes. Over 50% of encounter was spent on counseling and coordination of care. Return in about 3 months  (around 07/03/2016) for a follow-up.  Donnamae Jude 04/02/2016 10:47 AM

## 2016-07-06 DIAGNOSIS — Z3202 Encounter for pregnancy test, result negative: Secondary | ICD-10-CM | POA: Diagnosis not present

## 2016-07-14 ENCOUNTER — Emergency Department
Admission: EM | Admit: 2016-07-14 | Discharge: 2016-07-14 | Disposition: A | Discharge status: Home / Self Care | Payer: 59 | Source: Home / Self Care | Attending: Family Medicine | Admitting: Family Medicine

## 2016-07-14 ENCOUNTER — Encounter: Payer: Self-pay | Admitting: Emergency Medicine

## 2016-07-14 DIAGNOSIS — D649 Anemia, unspecified: Secondary | ICD-10-CM

## 2016-07-14 DIAGNOSIS — R5383 Other fatigue: Secondary | ICD-10-CM

## 2016-07-14 DIAGNOSIS — N921 Excessive and frequent menstruation with irregular cycle: Secondary | ICD-10-CM

## 2016-07-14 DIAGNOSIS — R531 Weakness: Secondary | ICD-10-CM

## 2016-07-14 LAB — POCT CBC W AUTO DIFF (K'VILLE URGENT CARE)

## 2016-07-14 MED ORDER — IBUPROFEN 800 MG PO TABS: 800.0000 mg | ORAL_TABLET | Freq: Three times a day (TID) | ORAL | 0 refills | Status: DC

## 2016-07-14 NOTE — ED Provider Notes (Signed)
CSN: [id number]     Arrival date & time 07/14/16  0915 History   First MD Initiated Contact with Patient 07/14/16 [phone]     Chief Complaint  Patient presents with  . Fatigue   (Consider location/radiation/quality/duration/timing/severity/associated sxs/prior Treatment) HPI [name] is a 31 y.o. female presenting to UC with c/o lower abdominal cramping that is 7/10, waxes and wanes with associated heavy menstrual bleeding.  Pt notes current cycle started on 07/11/16 after being 14 days late. Pt took several home pregnancy tests as well as f/u with her OB/GYN 1 week ago and had a negative pregnancy test as well.  She has not f/u with GYN since cycle starting.  Today she reports generalized weakness and fatigue and became concerned. No hx of of anemia.  She reports being told she has 3 uterine fibroids and has had ovarian cysts before.   Past Medical History:  Diagnosis Date  . Uterine fibroid    Past Surgical History:  Procedure Laterality Date  . APPENDECTOMY  03/15/11  . ROBOT ASSISTED MYOMECTOMY N/A 12/11/2012   Procedure: ROBOTIC ASSISTED MYOMECTOMY;  Surgeon: Marvene Staff, MD;  Location: [facility];  Service: Gynecology;  Laterality: N/A;   Family History  Problem Relation Age of Onset  . Cancer Maternal Aunt     pt unaware of what kind  . Cancer Maternal Grandmother     pt unawaure of what kinf   Social History  Substance Use Topics  . Smoking status: Never Smoker  . Smokeless tobacco: Never Used  . Alcohol use No   OB History    Gravida Para Term Preterm AB Living   0 0 0 0 0 0   SAB TAB Ectopic Multiple Live Births   0 0 0 0 0     Review of Systems  Constitutional: Positive for fatigue. Negative for chills and fever.  Gastrointestinal: Positive for abdominal pain ( cramping). Negative for diarrhea, nausea and vomiting.  Genitourinary: Positive for menstrual problem, pelvic pain and vaginal bleeding. Negative for dysuria, flank pain and urgency.   Musculoskeletal: Negative for back pain and myalgias.  Neurological: Positive for weakness. Negative for numbness.    Allergies  Patient has no known allergies.  Home Medications   Prior to Admission medications   Medication Sig Start Date End Date Taking? Authorizing Provider  ibuprofen (ADVIL,MOTRIN) 800 MG tablet Take 1 tablet (800 mg total) by mouth every 8 (eight) hours as needed (mild pain). Patient not taking: Reported on 04/02/2016 12/12/12   Servando Salina, MD  ibuprofen (ADVIL,MOTRIN) 800 MG tablet Take 1 tablet (800 mg total) by mouth 3 (three) times daily. 07/14/16   Noland Fordyce, PA-C  traMADol (ULTRAM) 50 MG tablet Take 1 tablet (50 mg total) by mouth every 6 (six) hours as needed. 03/29/16   Noland Fordyce, PA-C  Vitamin D, Ergocalciferol, (DRISDOL) 50000 UNITS CAPS Take 50,000 Units by mouth every 7 (seven) days.    Historical Provider, MD   Meds Ordered and Administered this Visit  Medications - No data to display  BP 145/98 (BP Location: Left Arm)   Pulse 89   Temp 97.8 F (36.6 C) (Oral)   Resp 16   Ht 5\' 3"  (1.6 m)   Wt 190 lb (86.2 kg)   LMP 07/11/2016   SpO2 100%   BMI 33.66 kg/m  No data found.   Physical Exam  Constitutional: She is oriented to person, place, and time. She appears well-developed and well-nourished. No distress.  Sitting  on exam bed, alert, NAD.  HENT:  Head: Normocephalic and atraumatic.  Mouth/Throat: Oropharynx is clear and moist.  Eyes: EOM are normal.  Neck: Normal range of motion. Neck supple.  Cardiovascular: Normal rate and regular rhythm.   Pulmonary/Chest: Effort normal and breath sounds normal. No respiratory distress. She has no wheezes. She has no rales.  Abdominal: Soft. She exhibits no distension and no mass. There is no tenderness. There is no rebound and no guarding.  Musculoskeletal: Normal range of motion.  Neurological: She is alert and oriented to person, place, and time.  Skin: Skin is warm and dry. She is  not diaphoretic.  Psychiatric: She has a normal mood and affect. Her behavior is normal.  Nursing note and vitals reviewed.   Urgent Care Course     Procedures (including critical care time)  Labs Review Labs Reviewed  POCT CBC W AUTO DIFF ([place]) - Abnormal; Notable for the following:     Imaging Review No results found.   MDM   1. Other fatigue   2. Weakness   3. Menorrhagia with irregular cycle   4. Mild anemia    CBC: c/w mild anemia- Hgb 11.0 (normal 11.8-15.5), Hct- 34.0 (normal 34.8-46.0)  Vitals: WNL, pt appears hemodynamically stable with normal HR- 89 and slight elevated BP of 145/98  Consulted with Dr. Assunta Found, recommends 800mg  ibuprofen every 6-8 hours for cramping.  Encouraged to call OB/GYN on Monday for f/u. Encouraged to stay well hydrated. May try OTC iron pills, recommend taking with small meal. May try orange juice to help with generalized weakness.  Discussed symptoms that warrant emergent care in the ED. [facility] if needed.     Noland Fordyce, PA-C 07/14/16 1103

## 2016-07-14 NOTE — ED Triage Notes (Signed)
Patient presents to [facility] with C/O lower abdominal 7/10 cramping type pain. Started menstrual cycle on 07/11/2016, after being late times 14 days patient states she took multiple home pregnancy test which was negative, she states she also followed up with her OB-GYN and had a negative pregnancy test. C/o increased blood flow, generalized weakness and fatigue.

## 2016-07-14 NOTE — ED Notes (Signed)
Sees Dr Jeannetta Nap

## 2016-07-14 NOTE — ED Notes (Signed)
Obtained blood sample from right [facility] with first attempt patient tolerated well.

## 2016-07-20 ENCOUNTER — Ambulatory Visit (INDEPENDENT_AMBULATORY_CARE_PROVIDER_SITE_OTHER): Payer: 59 | Admitting: Family Medicine

## 2016-07-20 VITALS — BP 139/80 | HR 88 | Ht 63.0 in | Wt 193.0 lb

## 2016-07-20 DIAGNOSIS — D251 Intramural leiomyoma of uterus: Secondary | ICD-10-CM

## 2016-07-20 DIAGNOSIS — N921 Excessive and frequent menstruation with irregular cycle: Secondary | ICD-10-CM | POA: Diagnosis not present

## 2016-07-20 NOTE — Progress Notes (Signed)
   Subjective:    Patient ID: [name], female    DOB: Oct 15, 1985, 31 y.o.   MRN: [id number]  HPI 31 year old G43 presents as a new patient due to a heavy menstrual bleeding cycle. Her normal cycles are every 27-28 days with approximately 5-6 days of moderate bleeding. This past month, the patient's cycle was approximately 2 weeks late and was extremely heavy. She changed her pad and tampon every hour and often leaked through. She went walking care received ibuprofen and instructed to follow-up with her primary care physician. Reportedly, CBC was done, which was 11.0. She did take several pregnancy tests at home, which were all negative.   Previously, the patient has been seen at [facility]. She reports normal Pap smears, with her last Pap smear 12/2015.  In reviewing her records, the patient does have an ultrasound from 03/2016 which shows a slightly enlarged uterus (7.5 x 4.2 x 5.3 cm) with 3 small intramural fibroids ranging from 1 cm to 2.3 cm. The endometrium is 10 mm. On this ultrasound, that she did have a complex ovarian cyst that was 4 cm in diameter.  She does have history of intramural/submucosal fibroid resection robotically with stage I pelvic endometriosis.  I have reviewed the patients past medical, family, and social history.  I have reviewed the patient's medication list and allergies.  Review of Systems     Objective:   Physical Exam  Constitutional: She is oriented to person, place, and time. She appears well-developed and well-nourished.  HENT:  Head: Normocephalic and atraumatic.  Right Ear: External ear normal.  Left Ear: External ear normal.  Eyes: Pupils are equal, round, and reactive to light.  Neck: Normal range of motion. Neck supple. No tracheal deviation present. No thyromegaly present.  Cardiovascular: Normal rate, regular rhythm and normal heart sounds.   Pulmonary/Chest: Effort normal and breath sounds normal. No respiratory distress. She has no  wheezes. She has no rales. She exhibits no tenderness.  Abdominal: Soft. She exhibits no distension and no mass. There is tenderness (mild RLQ). There is no rebound and no guarding.  Neurological: She is alert and oriented to person, place, and time.  Skin: Skin is warm and dry.  Psychiatric: She has a normal mood and affect. Her behavior is normal. Judgment and thought content normal.      Assessment & Plan:  1. Menorrhagia with irregular cycle Just one episode. Discussed with patient that not able to determine causality from just one episode. This may simply be a one time episode, although certainly menorrhagia can occur with fibroids. And will track her cycles with next few months and return in approximately 3 months to evaluate need for intervention.  2. Intramural leiomyoma of uterus Discussed possible treatments including doing nothing, medication with OCPs her IUD, surgical resection, artery embolization with interventional radiology, etc.

## 2016-07-20 NOTE — Patient Instructions (Signed)

## 2016-07-20 NOTE — Progress Notes (Signed)
Patient states last period was 13 days late and it started Feb 22-27 2018. Patient states she was soaking through heavy pads. Patient reports some bleeding again the last two days. Kathrene Alu RNBSN

## 2017-02-20 IMAGING — US US PELVIS COMPLETE
1 series · 13 of 25 positions shown · non-contrast
Comparison: 03/29/2016 CT abdomen/ pelvis. 05/18/2014 pelvic
sonogram.

CLINICAL DATA: 30-year-old female with a history of uterine fibroid
and myomectomy presents for follow-up of left ovarian cyst seen on
recent CT study. LMP 03/09/2016.

EXAM:
TRANSABDOMINAL AND TRANSVAGINAL ULTRASOUND OF PELVIS
TECHNIQUE: Both transabdominal and transvaginal ultrasound examinations of the
pelvis were performed. Transabdominal technique was performed for
global imaging of the pelvis including uterus, ovaries, adnexal
regions, and pelvic cul-de-sac. It was necessary to proceed with
endovaginal exam following the transabdominal exam to visualize the
endometrium and adnexa.

[Series 1: us pelvis complete · 0.27mm/px · 13 of 49 slices shown]
[im 1/49]
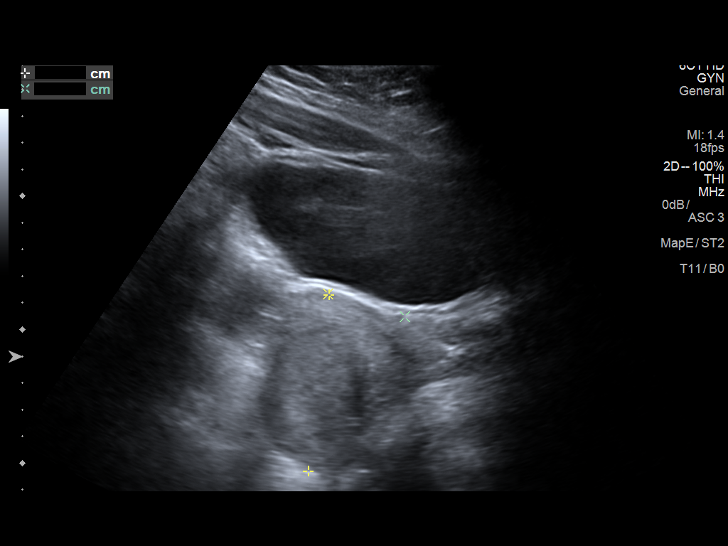
[im 5/49]
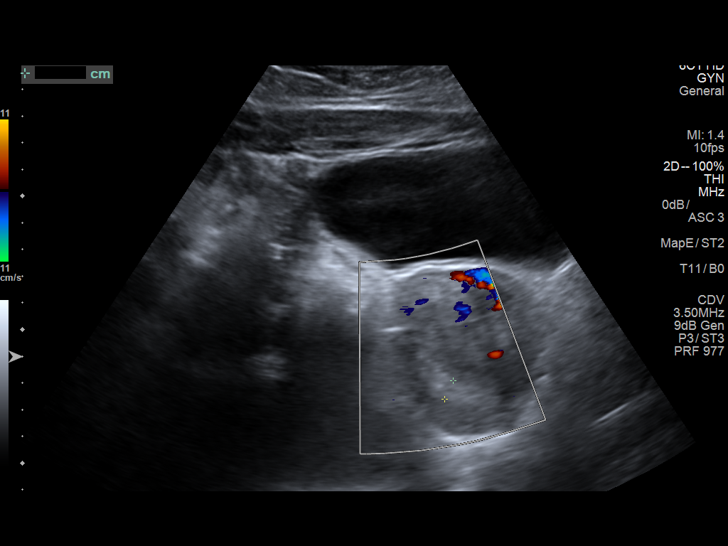
[im 9/49]
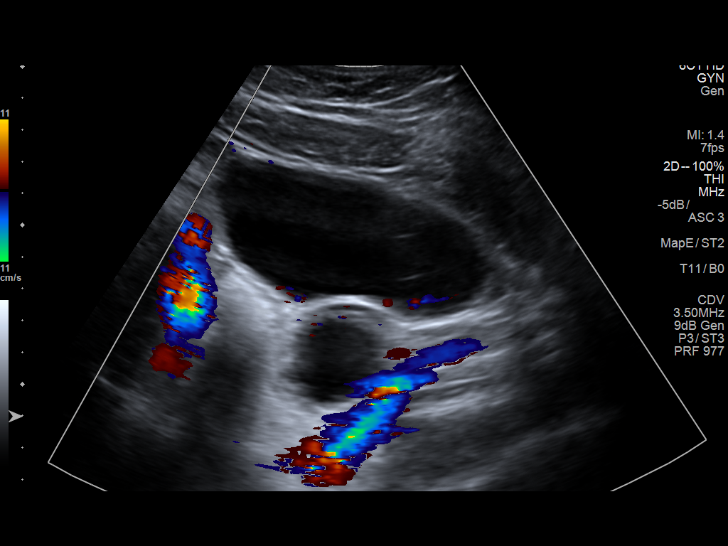
[im 13/49]
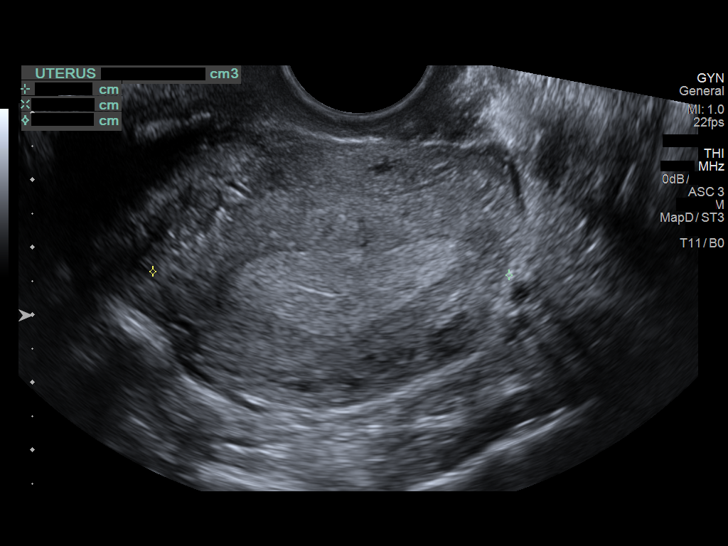
[im 17/49]
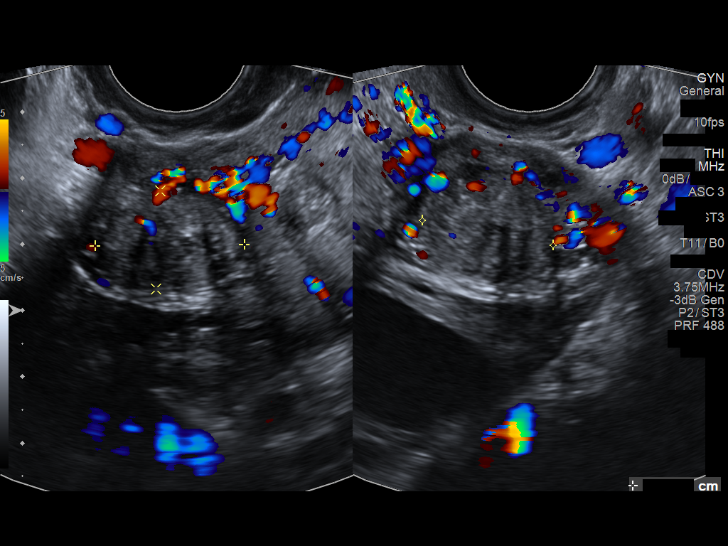
[im 21/49]
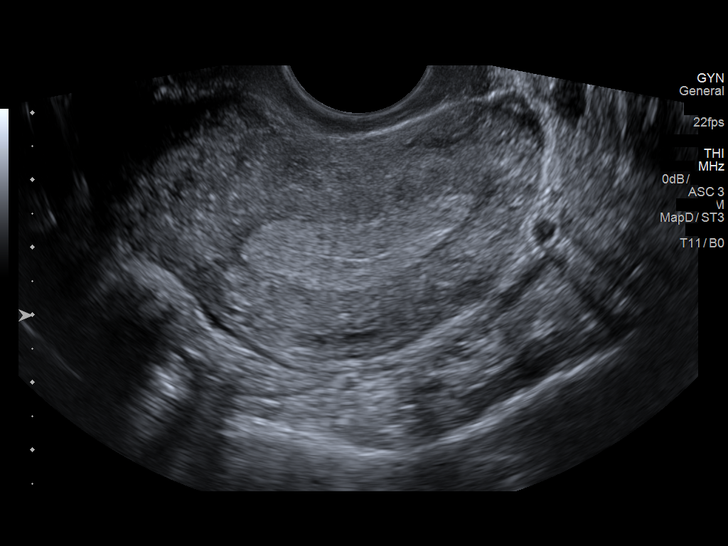
[im 25/49]
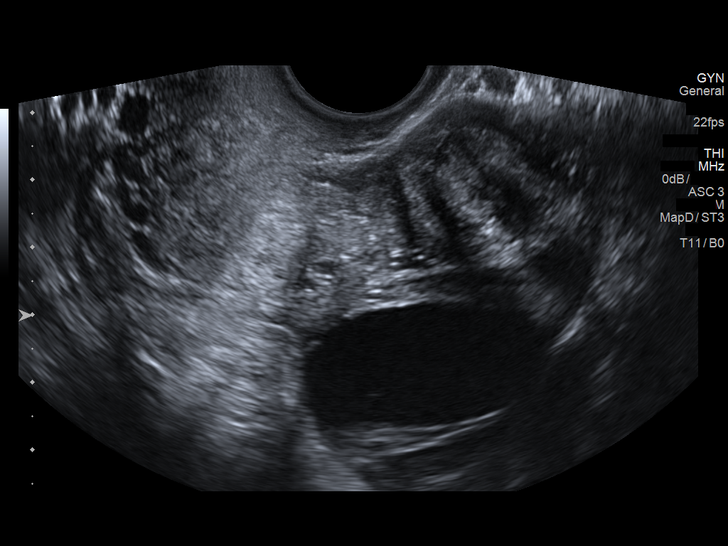
[im 29/49]
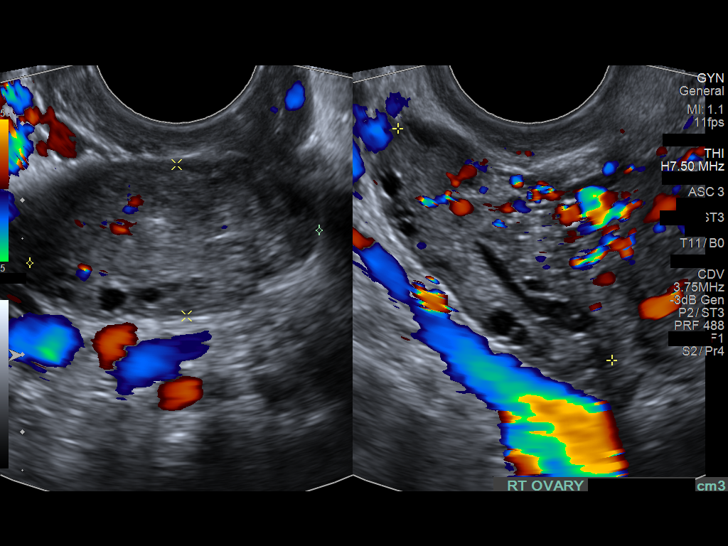
[im 33/49]
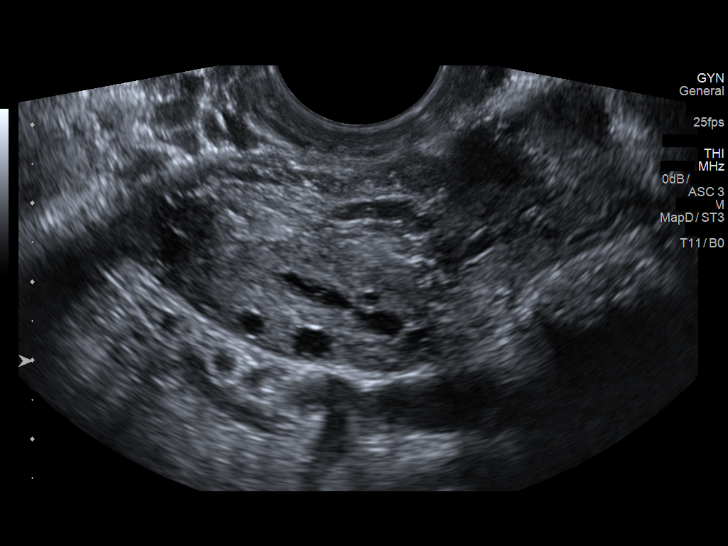
[im 37/49]
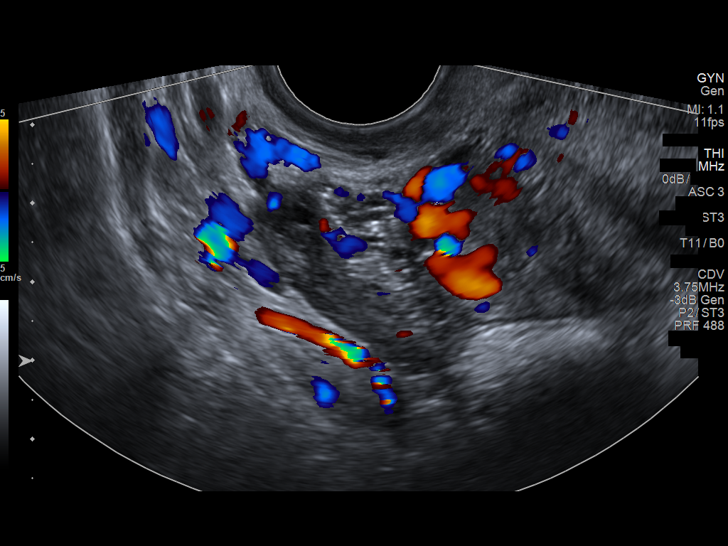
[im 41/49]
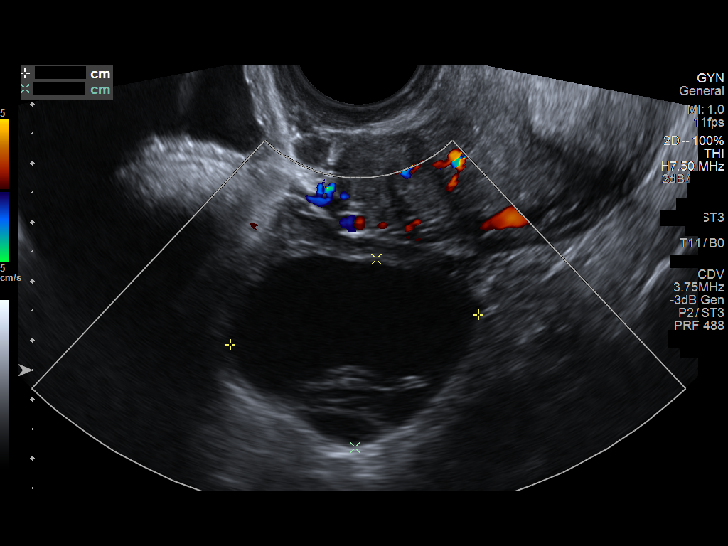
[im 45/49]
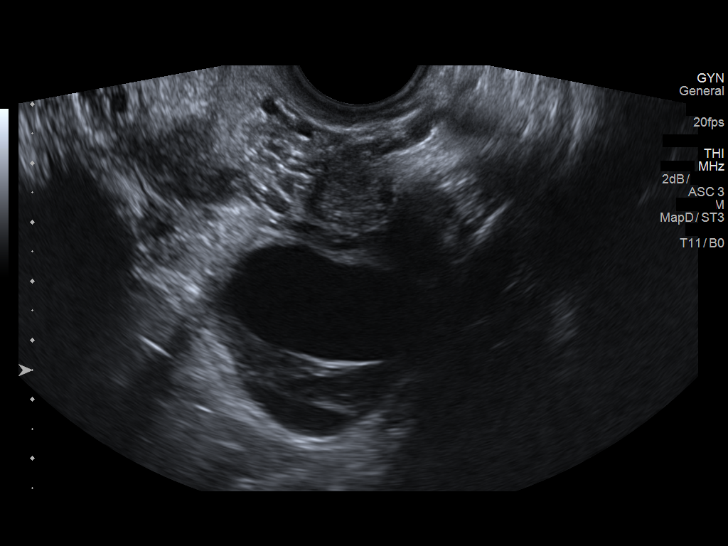
[im 49/49]
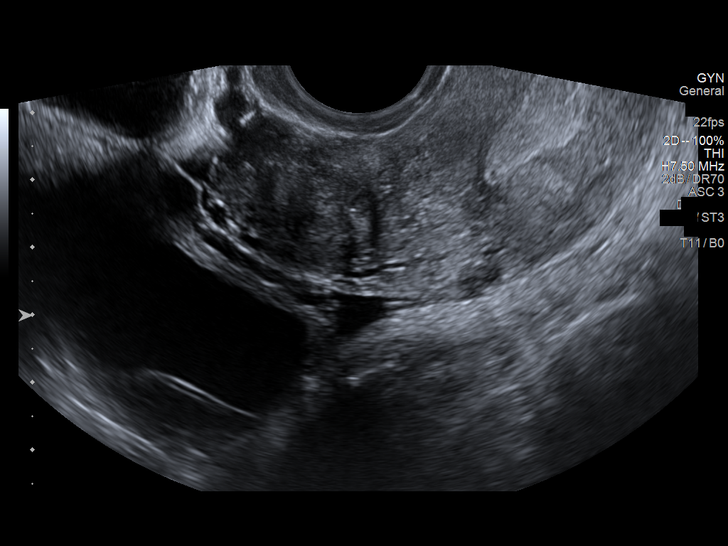

[13 of 25 positions shown; findings below may reference images not displayed]

FINDINGS: Uterus

Measurements: 7.5 x 4.2 x 5.3 cm. Retroverted retroflexed uterus is
minimally enlarged by uterine fibroids as follows:

- right uterine body intramural 1.5 x 1.3 x 1.7 cm fibroid

- left lower uterine body intramural 2.3 x 1.5 x 2.0 cm fibroid

- left uterine body intramural 1.0 x 0.9 x 1.0 cm fibroid

Endometrium

Thickness: 10 mm. No endometrial cavity fluid or focal endometrial
mass demonstrated.

Right ovary

Measurements: 4.1 x 2.0 x 3.8 cm. Normal appearance/no adnexal mass.

Left ovary

Measurements: 5.3 x 3.6 x 4.4 cm. There is a complex 4.2 x 3.2 x
cm left ovarian cyst with retractile internal clot and no internal
vascularity, consistent with a hemorrhagic cyst. No suspicious left
ovarian or left adnexal mass.

Other findings

No abnormal free fluid.
IMPRESSION: 1. Left ovarian 4.2 cm hemorrhagic cyst. No suspicious ovarian or
adnexal masses. No further follow-up is required. This
recommendation follows the consensus statement: Management of
Asymptomatic Ovarian and Other Adnexal Cysts Imaged at US: Society
of Radiologists in Ultrasound Consensus Conference Statement.
2. Mildly enlarged myomatous retroverted uterus.

## 2017-03-17 ENCOUNTER — Ambulatory Visit (HOSPITAL_COMMUNITY)
Admission: EM | Admit: 2017-03-17 | Discharge: 2017-03-17 | Disposition: A | Discharge status: Home / Self Care | Payer: BLUE CROSS/BLUE SHIELD | Attending: Internal Medicine | Admitting: Internal Medicine

## 2017-03-17 ENCOUNTER — Encounter (HOSPITAL_COMMUNITY): Payer: Self-pay | Admitting: Emergency Medicine

## 2017-03-17 DIAGNOSIS — N83202 Unspecified ovarian cyst, left side: Secondary | ICD-10-CM | POA: Insufficient documentation

## 2017-03-17 DIAGNOSIS — R1032 Left lower quadrant pain: Secondary | ICD-10-CM | POA: Diagnosis not present

## 2017-03-17 DIAGNOSIS — Z79899 Other long term (current) drug therapy: Secondary | ICD-10-CM | POA: Diagnosis not present

## 2017-03-17 DIAGNOSIS — Z791 Long term (current) use of non-steroidal anti-inflammatories (NSAID): Secondary | ICD-10-CM | POA: Diagnosis not present

## 2017-03-17 DIAGNOSIS — N3090 Cystitis, unspecified without hematuria: Secondary | ICD-10-CM | POA: Diagnosis not present

## 2017-03-17 DIAGNOSIS — D259 Leiomyoma of uterus, unspecified: Secondary | ICD-10-CM | POA: Diagnosis not present

## 2017-03-17 DIAGNOSIS — N809 Endometriosis, unspecified: Secondary | ICD-10-CM | POA: Diagnosis not present

## 2017-03-17 DIAGNOSIS — Z79891 Long term (current) use of opiate analgesic: Secondary | ICD-10-CM | POA: Diagnosis not present

## 2017-03-17 DIAGNOSIS — Z9889 Other specified postprocedural states: Secondary | ICD-10-CM | POA: Diagnosis not present

## 2017-03-17 LAB — POCT URINALYSIS DIP (DEVICE)
BILIRUBIN URINE: NEGATIVE
GLUCOSE, UA: NEGATIVE mg/dL
HGB URINE DIPSTICK: NEGATIVE
Ketones, ur: NEGATIVE mg/dL
NITRITE: NEGATIVE
Protein, ur: NEGATIVE mg/dL
Specific Gravity, Urine: 1.025 (ref 1.005–1.030)
UROBILINOGEN UA: 0.2 mg/dL (ref 0.0–1.0)
pH: 6.5 (ref 5.0–8.0)

## 2017-03-17 MED ORDER — CEPHALEXIN 500 MG PO CAPS: 500.0000 mg | ORAL_CAPSULE | Freq: Two times a day (BID) | ORAL | 0 refills | Status: AC

## 2017-03-17 NOTE — ED Triage Notes (Signed)
Pt here for left sided flank and abd pain with some dysuria x 3 days

## 2017-03-17 NOTE — Discharge Instructions (Signed)
Your urine was positive for an urinary tract infection. Start Keflex as directed. Keep hydrated, your urine should be clear to pale yellow in color. Monitor for any worsening of symptoms, fever, worsening abdominal pain, nausea/vomiting, flank pain, follow up for reevaluation.

## 2017-03-17 NOTE — ED Provider Notes (Signed)
[name]    CSN: [id number] Arrival date & time: 03/17/17  1916     History   Chief Complaint Chief Complaint  Patient presents with  . Flank Pain  . Urinary Tract Infection    HPI [name] is a 31 y.o. female.   31 year old female with history of ovarian cyst, uterine fibroid, endometriosis comes in for 3 day history of left sided flank pain, left lower abdominal pain, dysuria. She is also experiencing urinary frequency/urgency. Patient states LMP 03/09/2017, and ended 3 days ago, and usually has abdominal pain due to endometriosis, but has continued after cycle ended. States left lower abdominal pain that radiates to the back. Has taken some ibuprofen with some improvement. Sexually active with 1 partner without condom use. Denies vaginal discharge, itching/pain, spotting. Denies fever, chills, night sweats.       Past Medical History:  Diagnosis Date  . Uterine fibroid     Patient Active Problem List   Diagnosis Date Noted  . LLQ pain 04/02/2016  . Ovarian cyst 04/02/2016  . Fibroid 04/02/2016  . Endometriosis 04/02/2016    Past Surgical History:  Procedure Laterality Date  . APPENDECTOMY  03/15/11  . ROBOT ASSISTED MYOMECTOMY N/A 12/11/2012   Procedure: ROBOTIC ASSISTED MYOMECTOMY;  Surgeon: Marvene Staff, MD;  Location: [facility];  Service: Gynecology;  Laterality: N/A;    OB History    Gravida Para Term Preterm AB Living   0 0 0 0 0 0   SAB TAB Ectopic Multiple Live Births   0 0 0 0 0       Home Medications    Prior to Admission medications   Medication Sig Start Date End Date Taking? Authorizing Provider  cephALEXin (KEFLEX) 500 MG capsule Take 1 capsule (500 mg total) by mouth 2 (two) times daily. 03/17/17 03/24/17  Ok Edwards, PA-C  ibuprofen (ADVIL,MOTRIN) 800 MG tablet Take 1 tablet (800 mg total) by mouth every 8 (eight) hours as needed (mild pain). Patient not taking: Reported on 04/02/2016 12/12/12   Servando Salina, MD  ibuprofen (ADVIL,MOTRIN) 800 MG tablet Take 1 tablet (800 mg total) by mouth 3 (three) times daily. 07/14/16   Noe Gens, PA-C  traMADol (ULTRAM) 50 MG tablet Take 1 tablet (50 mg total) by mouth every 6 (six) hours as needed. 03/29/16   Noe Gens, PA-C  Vitamin D, Ergocalciferol, (DRISDOL) 50000 UNITS CAPS Take 50,000 Units by mouth every 7 (seven) days.    [provider]    Family History Family History  Problem Relation Age of Onset  . Cancer Maternal Aunt        pt unaware of what kind  . Cancer Maternal Grandmother        pt unawaure of what kinf    Social History Social History  Substance Use Topics  . Smoking status: Never Smoker  . Smokeless tobacco: Never Used  . Alcohol use No     Allergies   Patient has no known allergies.   Review of Systems Review of Systems  Reason unable to perform ROS: See HPI as above.     Physical Exam Triage Vital Signs ED Triage Vitals [03/17/17 1931]  Enc Vitals Group     BP (!) 144/91     Pulse Rate 92     Resp 18     Temp 98.4 F (36.9 C)     Temp Source Oral     SpO2 99 %  Weight      Height      Head Circumference      Peak Flow      Pain Score 8     Pain Loc      Pain Edu?      Excl. in [place]?    No data found.   Updated Vital Signs BP (!) 144/91 (BP Location: Right Arm)   Pulse 92   Temp 98.4 F (36.9 C) (Oral)   Resp 18   SpO2 99%   Physical Exam  Constitutional: She is oriented to person, place, and time. She appears well-developed and well-nourished. No distress.  Eyes: Pupils are equal, round, and reactive to light. Conjunctivae are normal.  Cardiovascular: Normal rate, regular rhythm and normal heart sounds.  Exam reveals no gallop and no friction rub.   No murmur heard. Pulmonary/Chest: Effort normal and breath sounds normal. She has no wheezes. She has no rales.  Abdominal: Soft. Bowel sounds are normal. She exhibits no distension. There is tenderness  (generlized, max at LLQ). There is no rebound, no guarding and no CVA tenderness.  Neurological: She is alert and oriented to person, place, and time.  Skin: Skin is warm and dry.     UC Treatments / Results  Labs (all labs ordered are listed, but only abnormal results are displayed) Labs Reviewed  POCT URINALYSIS DIP (DEVICE) - Abnormal; Notable for the following:       Result Value   Leukocytes, UA SMALL (*)    All other components within normal limits  URINE CULTURE    EKG  EKG Interpretation None       Radiology No results found.  Procedures Procedures (including critical care time)  Medications Ordered in UC Medications - No data to display   Initial Impression / Assessment and Plan / UC Course  I have reviewed the triage vital signs and the nursing notes.  Pertinent labs & imaging results that were available during my care of the patient were reviewed by me and considered in my medical decision making (see chart for details).    Urine dipstick positive for UTI. Start antibiotics as directed. Push fluids. Patient with known left ovarian cyst followed by GYN without alarming abdominal exam today. Will have patient follow up with GYN if symptoms do not improve. Return precautions given.   Final Clinical Impressions(s) / UC Diagnoses   Final diagnoses:  Cystitis    New Prescriptions Discharge Medication List as of 03/17/2017  7:49 PM    START taking these medications   Details  cephALEXin (KEFLEX) 500 MG capsule Take 1 capsule (500 mg total) by mouth 2 (two) times daily., Starting Sun 03/17/2017, Until Sun 03/24/2017, Normal          Yu, Amy V, PA-C 03/17/17 2015

## 2017-03-18 LAB — URINE CULTURE: Culture: >=100000 — AB

## 2017-04-14 ENCOUNTER — Other Ambulatory Visit: Payer: Self-pay

## 2017-04-14 ENCOUNTER — Encounter: Payer: Self-pay | Admitting: Emergency Medicine

## 2017-04-14 ENCOUNTER — Emergency Department
Admission: EM | Admit: 2017-04-14 | Discharge: 2017-04-14 | Disposition: A | Discharge status: Home / Self Care | Payer: BLUE CROSS/BLUE SHIELD | Source: Home / Self Care

## 2017-04-14 DIAGNOSIS — R1032 Left lower quadrant pain: Secondary | ICD-10-CM | POA: Diagnosis not present

## 2017-04-14 LAB — POCT URINALYSIS DIP (MANUAL ENTRY)
BILIRUBIN UA: NEGATIVE
GLUCOSE UA: NEGATIVE mg/dL
NITRITE UA: NEGATIVE
Protein Ur, POC: NEGATIVE mg/dL
RBC UA: NEGATIVE
Spec Grav, UA: >=1.03 — AB (ref 1.010–1.025)
Urobilinogen, UA: 0.2 E.U./dL
pH, UA: 6 (ref 5.0–8.0)

## 2017-04-14 LAB — POCT URINE PREGNANCY: Preg Test, Ur: NEGATIVE

## 2017-04-14 MED ORDER — TRAMADOL HCL 50 MG PO TABS: 50.0000 mg | ORAL_TABLET | Freq: Four times a day (QID) | ORAL | 0 refills | Status: DC | PRN

## 2017-04-14 MED ORDER — ONDANSETRON 4 MG PO TBDP: ORAL_TABLET | ORAL | 0 refills | Status: DC

## 2017-04-14 NOTE — Discharge Instructions (Signed)
May continue Ibuprofen 200mg , 4 tabs every 8 hours with food, as needed. If symptoms become significantly worse during the night or over the weekend, proceed to the local emergency room.

## 2017-04-14 NOTE — ED Provider Notes (Signed)
[name]    CSN: [id number] Arrival date & time: 04/14/17  1637     History   Chief Complaint Chief Complaint  Patient presents with  . Abdominal Pain    HPI [name] is a 31 y.o. female.   Patient complains of recurrent constant left lower quadrant non-radiating pain for 1 week.  The pain tends to become worse mid-day, and is worse with movement.  Patient's last menstrual period was 04/03/2017 (exact date).  No vaginal discharge or urinary symptoms.  She has occasional nausea when the pain increases but no vomiting.  She has had no change in bowel movements. She has a several year history of similar left side pain that recurs intermittently.  She works in a medical office and checked her CBC 2 days ago:  WBC 9.0 and Hgb 13.1.  Review of previous pelvic transvaginal ultrasound done 04/02/16 showed a complex 4.2cm by 3.2cm by 3.7cm left ovarian cyst with retractile blood clot consistent with hemorragic cyst.  She was having similar left lower quadrant pain at that time.  She reports that she has a follow-up appointment with her OBGYN in 3 days.   The history is provided by the patient.  Abdominal Pain  Pain location:  LLQ Pain quality: aching, dull and fullness   Pain radiates to:  Does not radiate Pain severity:  Moderate Onset quality:  Gradual Duration:  1 week Timing:  Constant Progression:  Unchanged Chronicity:  Recurrent Context: awakening from sleep and previous surgery   Context: not alcohol use, not diet changes, not eating, not laxative use, not medication withdrawal, not recent illness, not recent travel, not sick contacts, not suspicious food intake and not trauma   Relieved by:  Nothing Worsened by:  Movement and position changes Ineffective treatments:  NSAIDs Associated symptoms: nausea   Associated symptoms: no anorexia, no belching, no chest pain, no chills, no constipation, no cough, no diarrhea, no dysuria, no fatigue, no fever, no  flatus, no hematemesis, no hematochezia, no hematuria, no melena, no shortness of breath, no vaginal bleeding, no vaginal discharge and no vomiting     Past Medical History:  Diagnosis Date  . Uterine fibroid     Patient Active Problem List   Diagnosis Date Noted  . LLQ pain 04/02/2016  . Ovarian cyst 04/02/2016  . Fibroid 04/02/2016  . Endometriosis 04/02/2016    Past Surgical History:  Procedure Laterality Date  . APPENDECTOMY  03/15/11  . ROBOT ASSISTED MYOMECTOMY N/A 12/11/2012   Procedure: ROBOTIC ASSISTED MYOMECTOMY;  Surgeon: Marvene Staff, MD;  Location: [facility];  Service: Gynecology;  Laterality: N/A;    OB History    Gravida Para Term Preterm AB Living   0 0 0 0 0 0   SAB TAB Ectopic Multiple Live Births   0 0 0 0 0       Home Medications    Prior to Admission medications   Medication Sig Start Date End Date Taking? Authorizing Provider  ibuprofen (ADVIL,MOTRIN) 800 MG tablet Take 1 tablet (800 mg total) by mouth every 8 (eight) hours as needed (mild pain). Patient not taking: Reported on 04/02/2016 12/12/12   Servando Salina, MD  ibuprofen (ADVIL,MOTRIN) 800 MG tablet Take 1 tablet (800 mg total) by mouth 3 (three) times daily. 07/14/16   Noe Gens, PA-C  ondansetron (ZOFRAN ODT) 4 MG disintegrating tablet Take one tab by mouth Q6hr prn nausea.  Dissolve under tongue. 04/14/17   Kandra Nicolas, MD  traMADol (ULTRAM) 50 MG tablet Take 1 tablet (50 mg total) by mouth every 6 (six) hours as needed for moderate pain. 04/14/17   Kandra Nicolas, MD  Vitamin D, Ergocalciferol, (DRISDOL) 50000 UNITS CAPS Take 50,000 Units by mouth every 7 (seven) days.    [provider]    Family History Family History  Problem Relation Age of Onset  . Cancer Maternal Aunt        pt unaware of what kind  . Cancer Maternal Grandmother        pt unawaure of what kinf    Social History Social History   Tobacco Use  . Smoking status: Never Smoker  .  Smokeless tobacco: Never Used  Substance Use Topics  . Alcohol use: No  . Drug use: No     Allergies   Patient has no known allergies.   Review of Systems Review of Systems  Constitutional: Negative for chills, fatigue and fever.  Respiratory: Negative for cough and shortness of breath.   Cardiovascular: Negative for chest pain.  Gastrointestinal: Positive for abdominal pain and nausea. Negative for anorexia, constipation, diarrhea, flatus, hematemesis, hematochezia, melena and vomiting.  Genitourinary: Negative for dysuria, hematuria, vaginal bleeding and vaginal discharge.     Physical Exam Triage Vital Signs ED Triage Vitals  Enc Vitals Group     BP 04/14/17 1708 (!) 133/92     Pulse Rate 04/14/17 1708 100     Resp 04/14/17 1708 16     Temp 04/14/17 1708 97.9 F (36.6 C)     Temp Source 04/14/17 1708 Oral     SpO2 04/14/17 1708 100 %     Weight 04/14/17 1709 187 lb (84.8 kg)     Height 04/14/17 1709 5\' 3"  (1.6 m)     Head Circumference --      Peak Flow --      Pain Score 04/14/17 1709 5     Pain Loc --      Pain Edu? --      Excl. in [place]? --    No data found.  Updated Vital Signs BP (!) 133/92 (BP Location: Left Arm)   Pulse 100   Temp 97.9 F (36.6 C) (Oral)   Resp 16   Ht 5\' 3"  (1.6 m)   Wt 187 lb (84.8 kg)   SpO2 100%   BMI 33.13 kg/m   Visual Acuity Right Eye Distance:   Left Eye Distance:   Bilateral Distance:    Right Eye Near:   Left Eye Near:    Bilateral Near:     Physical Exam Nursing notes and Vital Signs reviewed. Appearance:  Patient appears stated age, and in no acute distress.    Eyes:  Pupils are equal, round, and reactive to light and accomodation.  Extraocular movement is intact.  Conjunctivae are not inflamed   Pharynx:  Normal; moist mucous membranes  Neck:  Supple.  No adenopathy Lungs:  Clear to auscultation.  Breath sounds are equal.  Moving air well. Heart:  Regular rate and rhythm without murmurs, rubs, or gallops.    Abdomen:  Vague tenderness left lower quadrant without masses or hepatosplenomegaly.  Bowel sounds are present.  No CVA or flank tenderness.  No rebound tenderness. Extremities:  No edema.  Skin:  No rash present.    Pelvic exam deferred   UC Treatments / Results  Labs (all labs ordered are listed, but only abnormal results are displayed) Labs Reviewed  POCT URINALYSIS DIP (MANUAL  ENTRY) - Abnormal; Notable for the following components:      Result Value   Ketones, POC UA small (15) (*)    Spec Grav, UA >=1.030 (*)    Leukocytes, UA Small (1+) (*)    All other components within normal limits  POCT URINE PREGNANCY negative    EKG  EKG Interpretation None       Radiology No results found.  Procedures Procedures (including critical care time)  Medications Ordered in UC Medications - No data to display   Initial Impression / Assessment and Plan / UC Course  I have reviewed the triage vital signs and the nursing notes.  Pertinent labs & imaging results that were available during my care of the patient were reviewed by me and considered in my medical decision making (see chart for details).    Suspect recurrent left ovarian cyst and/or ?[facility] Rx for Zofran ODT 4mg  and Tramadol 50mg  May continue Ibuprofen 200mg , 4 tabs every 8 hours with food, as needed. If symptoms become significantly worse during the night or over the weekend, proceed to the local emergency room.  Followup with OBGYN as scheduled in three days.    Final Clinical Impressions(s) / UC Diagnoses   Final diagnoses:  Abdominal pain, left lower quadrant    ED Discharge Orders        Ordered    traMADol (ULTRAM) 50 MG tablet  Every 6 hours PRN     04/14/17 1748    ondansetron (ZOFRAN ODT) 4 MG disintegrating tablet     04/14/17 1748           Kandra Nicolas, MD 04/15/17 1251

## 2017-04-14 NOTE — ED Triage Notes (Signed)
LLQ Pain for about a month but more persistant over the last 2-3 days

## 2017-04-15 ENCOUNTER — Ambulatory Visit (INDEPENDENT_AMBULATORY_CARE_PROVIDER_SITE_OTHER): Payer: BLUE CROSS/BLUE SHIELD | Admitting: Obstetrics & Gynecology

## 2017-04-15 ENCOUNTER — Ambulatory Visit (HOSPITAL_BASED_OUTPATIENT_CLINIC_OR_DEPARTMENT_OTHER)
Admission: RE | Admit: 2017-04-15 | Discharge: 2017-04-15 | Disposition: A | Discharge status: Home / Self Care | Payer: BLUE CROSS/BLUE SHIELD | Source: Ambulatory Visit | Attending: Obstetrics & Gynecology | Admitting: Obstetrics & Gynecology

## 2017-04-15 ENCOUNTER — Encounter: Payer: Self-pay | Admitting: Obstetrics & Gynecology

## 2017-04-15 VITALS — BP 144/85 | HR 90 | Ht 63.0 in | Wt 183.0 lb

## 2017-04-15 DIAGNOSIS — R1032 Left lower quadrant pain: Secondary | ICD-10-CM

## 2017-04-15 DIAGNOSIS — N854 Malposition of uterus: Secondary | ICD-10-CM | POA: Insufficient documentation

## 2017-04-15 DIAGNOSIS — Z113 Encounter for screening for infections with a predominantly sexual mode of transmission: Secondary | ICD-10-CM

## 2017-04-15 DIAGNOSIS — R102 Pelvic and perineal pain: Secondary | ICD-10-CM

## 2017-04-15 DIAGNOSIS — D259 Leiomyoma of uterus, unspecified: Secondary | ICD-10-CM | POA: Diagnosis not present

## 2017-04-15 MED ORDER — NORETHINDRONE ACET-ETHINYL EST 1-20 MG-MCG PO TABS: 1.0000 | ORAL_TABLET | Freq: Every day | ORAL | 3 refills | Status: DC

## 2017-04-15 MED ORDER — DICLOFENAC POTASSIUM 50 MG PO TABS: 50.0000 mg | ORAL_TABLET | Freq: Three times a day (TID) | ORAL | 2 refills | Status: DC

## 2017-04-15 NOTE — Patient Instructions (Addendum)
Oral Contraception Information  Oral contraceptive pills (OCPs) are medicines taken to prevent pregnancy. OCPs work by preventing the ovaries from releasing eggs. The hormones in OCPs also cause the cervical mucus to thicken, preventing the sperm from entering the uterus. The hormones also cause the uterine lining to become thin, not allowing a fertilized egg to attach to the inside of the uterus. OCPs are highly effective when taken exactly as prescribed. However, OCPs do not prevent sexually transmitted diseases (STDs). Safe sex practices, such as using condoms along with the pill, can help prevent STDs.  Before taking the pill, you may have a physical exam and Pap test. Your health care provider may order blood tests. The health care provider will make sure you are a good candidate for oral contraception. Discuss with your health care provider the possible side effects of the OCP you may be prescribed. When starting an OCP, it can take 2 to 3 months for the body to adjust to the changes in hormone levels in your body.  Types of oral contraception   The combination pill--This pill contains estrogen and progestin (synthetic progesterone) hormones. The combination pill comes in 21-day, 28-day, or 91-day packs. Some types of combination pills are meant to be taken continuously (365-day pills). With 21-day packs, you do not take pills for 7 days after the last pill. With 28-day packs, the pill is taken every day. The last 7 pills are without hormones. Certain types of pills have more than 21 hormone-containing pills. With 91-day packs, the first 84 pills contain both hormones, and the last 7 pills contain no hormones or contain estrogen only.   The minipill--This pill contains the progesterone hormone only. The pill is taken every day continuously. It is very important to take the pill at the same time each day. The minipill comes in packs of 28 pills. All 28 pills contain the hormone.  Advantages of oral  contraceptive pills   Decreases premenstrual symptoms.   Treats menstrual period cramps.   Regulates the menstrual cycle.   Decreases a heavy menstrual flow.   May treatacne, depending on the type of pill.   Treats abnormal uterine bleeding.   Treats polycystic ovarian syndrome.   Treats endometriosis.   Can be used as emergency contraception.  Things that can make oral contraceptive pills less effective  OCPs can be less effective if:   You forget to take the pill at the same time every day.   You have a stomach or intestinal disease that lessens the absorption of the pill.   You take OCPs with other medicines that make OCPs less effective, such as antibiotics, certain HIV medicines, and some seizure medicines.   You take expired OCPs.   You forget to restart the pill on day 7, when using the packs of 21 pills.    Risks associated with oral contraceptive pills  Oral contraceptive pills can sometimes cause side effects, such as:   Headache.   Nausea.   Breast tenderness.   Irregular bleeding or spotting.    Combination pills are also associated with a small increased risk of:   Blood clots.   Heart attack.   Stroke.    This information is not intended to replace advice given to you by your health care provider. Make sure you discuss any questions you have with your health care provider.  Document Released: 07/28/2002 Document Revised: 10/13/2015 Document Reviewed: 10/26/2012  Elsevier Interactive Patient Education  2018 Elsevier Inc.  Levonorgestrel intrauterine device (  IUD)  What is this medicine?  LEVONORGESTREL IUD (LEE voe nor jes trel) is a contraceptive (birth control) device. The device is placed inside the uterus by a healthcare professional. It is used to prevent pregnancy. This device can also be used to treat heavy bleeding that occurs during your period.  This medicine may be used for other purposes; ask your health care provider or pharmacist if you have questions.  COMMON BRAND  NAME(S): Kyleena, LILETTA, Mirena, Skyla  What should I tell my health care provider before I take this medicine?  They need to know if you have any of these conditions:  -abnormal Pap smear  -cancer of the breast, uterus, or cervix  -diabetes  -endometritis  -genital or pelvic infection now or in the past  -have more than one sexual partner or your partner has more than one partner  -heart disease  -history of an ectopic or tubal pregnancy  -immune system problems  -IUD in place  -liver disease or tumor  -problems with blood clots or take blood-thinners  -seizures  -use intravenous drugs  -uterus of unusual shape  -vaginal bleeding that has not been explained  -an unusual or allergic reaction to levonorgestrel, other hormones, silicone, or polyethylene, medicines, foods, dyes, or preservatives  -pregnant or trying to get pregnant  -breast-feeding  How should I use this medicine?  This device is placed inside the uterus by a health care professional.  Talk to your pediatrician regarding the use of this medicine in children. Special care may be needed.  Overdosage: If you think you have taken too much of this medicine contact a poison control center or emergency room at once.  NOTE: This medicine is only for you. Do not share this medicine with others.  What if I miss a dose?  This does not apply. Depending on the brand of device you have inserted, the device will need to be replaced every 3 to 5 years if you wish to continue using this type of birth control.  What may interact with this medicine?  Do not take this medicine with any of the following medications:  -amprenavir  -bosentan  -fosamprenavir  This medicine may also interact with the following medications:  -aprepitant  -armodafinil  -barbiturate medicines for inducing sleep or treating seizures  -bexarotene  -boceprevir  -griseofulvin  -medicines to treat seizures like carbamazepine, ethotoin, felbamate, oxcarbazepine, phenytoin,  topiramate  -modafinil  -pioglitazone  -rifabutin  -rifampin  -rifapentine  -some medicines to treat HIV infection like atazanavir, efavirenz, indinavir, lopinavir, nelfinavir, tipranavir, ritonavir  -St. John's wort  -warfarin  This list may not describe all possible interactions. Give your health care provider a list of all the medicines, herbs, non-prescription drugs, or dietary supplements you use. Also tell them if you smoke, drink alcohol, or use illegal drugs. Some items may interact with your medicine.  What should I watch for while using this medicine?  Visit your doctor or health care professional for regular check ups. See your doctor if you or your partner has sexual contact with others, becomes HIV positive, or gets a sexual transmitted disease.  This product does not protect you against HIV infection (AIDS) or other sexually transmitted diseases.  You can check the placement of the IUD yourself by reaching up to the top of your vagina with clean fingers to feel the threads. Do not pull on the threads. It is a good habit to check placement after each menstrual period. Call your doctor right away   if you feel more of the IUD than just the threads or if you cannot feel the threads at all.  The IUD may come out by itself. You may become pregnant if the device comes out. If you notice that the IUD has come out use a backup birth control method like condoms and call your health care provider.  Using tampons will not change the position of the IUD and are okay to use during your period.  This IUD can be safely scanned with magnetic resonance imaging (MRI) only under specific conditions. Before you have an MRI, tell your healthcare provider that you have an IUD in place, and which type of IUD you have in place.  What side effects may I notice from receiving this medicine?  Side effects that you should report to your doctor or health care professional as soon as possible:  -allergic reactions like skin rash,  itching or hives, swelling of the face, lips, or tongue  -fever, flu-like symptoms  -genital sores  -high blood pressure  -no menstrual period for 6 weeks during use  -pain, swelling, warmth in the leg  -pelvic pain or tenderness  -severe or sudden headache  -signs of pregnancy  -stomach cramping  -sudden shortness of breath  -trouble with balance, talking, or walking  -unusual vaginal bleeding, discharge  -yellowing of the eyes or skin  Side effects that usually do not require medical attention (report to your doctor or health care professional if they continue or are bothersome):  -acne  -breast pain  -change in sex drive or performance  -changes in weight  -cramping, dizziness, or faintness while the device is being inserted  -headache  -irregular menstrual bleeding within first 3 to 6 months of use  -nausea  This list may not describe all possible side effects. Call your doctor for medical advice about side effects. You may report side effects to FDA at 1-800-FDA-1088.  Where should I keep my medicine?  This does not apply.  NOTE: This sheet is a summary. It may not cover all possible information. If you have questions about this medicine, talk to your doctor, pharmacist, or health care provider.   2018 Elsevier/Gold Standard (2016-02-17 14:14:56)

## 2017-04-15 NOTE — Progress Notes (Signed)
History:  31 y.o. G0P0000 here today for LLQ pain.  LMP 04/03/2017.  She was seen in Urgent care over the weekend. She reports a h/o dysmenorrhea but, this is worse than prev.  She is on NSAIDS at home which has not helped but, last night she got Tramadol which did help with her sx markedly. Pt is s/p myomectomy 4 years prev.  Pt reports that she has a h/o an ovarian cyst and is worried that the pain could be from a persistent cyst.  She is in a monogamous relationship and she is not worried about STI exposure.   The following portions of the patient's history were reviewed and updated as appropriate: allergies, current medications, past family history, past medical history, past social history, past surgical history and problem list.  Review of Systems:  Pertinent items are noted in HPI.   Objective:  Physical Exam Blood pressure (!) 144/85, pulse 90, height 5\' 3"  (1.6 m), weight 183 lb (83 kg).  CONSTITUTIONAL: Well-developed, well-nourished female in no acute distress.  HENT:  Normocephalic, atraumatic EYES: Conjunctivae and EOM are normal. No scleral icterus.  NECK: Normal range of motion SKIN: Skin is warm and dry. No rash noted. Not diaphoretic.No pallor. [place]: Alert and oriented to person, place, and time. Normal coordination.  Abd: Soft, mildly tender on the left side; nondistended Pelvic: Normal appearing external genitalia; normal appearing vaginal mucosa and cervix.  Normal discharge.  Small uterus, there are small nodules noted posteriorly this could be due to nodularity on the uteral sacral ligaments vs small fibroids. There are no other palpable masses, no uterine or adnexal tenderness  Labs and Imaging 04/02/2016 CLINICAL DATA:  31 year old female with a history of uterine fibroid and myomectomy presents for follow-up of left ovarian cyst seen on recent CT study. LMP 03/09/2016.  EXAM: TRANSABDOMINAL AND TRANSVAGINAL ULTRASOUND OF PELVIS  TECHNIQUE: Both  transabdominal and transvaginal ultrasound examinations of the pelvis were performed. Transabdominal technique was performed for global imaging of the pelvis including uterus, ovaries, adnexal regions, and pelvic cul-de-sac. It was necessary to proceed with endovaginal exam following the transabdominal exam to visualize the endometrium and adnexa.  COMPARISON:  03/29/2016 CT abdomen/ pelvis. 05/18/2014 pelvic sonogram.  FINDINGS: Uterus  Measurements: 7.5 x 4.2 x 5.3 cm. Retroverted retroflexed uterus is minimally enlarged by uterine fibroids as follows:  - right uterine body intramural 1.5 x 1.3 x 1.7 cm fibroid  - left lower uterine body intramural 2.3 x 1.5 x 2.0 cm fibroid  - left uterine body intramural 1.0 x 0.9 x 1.0 cm fibroid  Endometrium  Thickness: 10 mm. No endometrial cavity fluid or focal endometrial mass demonstrated.  Right ovary  Measurements: 4.1 x 2.0 x 3.8 cm. Normal appearance/no adnexal mass.  Left ovary  Measurements: 5.3 x 3.6 x 4.4 cm. There is a complex 4.2 x 3.2 x 3.7 cm left ovarian cyst with retractile internal clot and no internal vascularity, consistent with a hemorrhagic cyst. No suspicious left ovarian or left adnexal mass.  Other findings  No abnormal free fluid.  IMPRESSION: 1. Left ovarian 4.2 cm hemorrhagic cyst. No suspicious ovarian or adnexal masses. No further follow-up is required. This recommendation follows the consensus statement: Management of Asymptomatic Ovarian and Other Adnexal Cysts Imaged at [place]: Society of Radiologists in [place]. Radiology 2010; [phone]. 2. Mildly enlarged myomatous retroverted uterus.   Assessment & Plan:  Pelvic pain- LLQ  Need to eval for OV cysts vs increase in the size of  her fibroids.   The DDx also includes endometriosis.  Rec [place] to eval structural pathology  Will begin OCPs. Pt has no contraindications to OCPs use  Will d/c Motrin  and Rx Cataflam 1 po tid prn pain  Tramadol 50mg  po q 6 hours. Pt received #20 last pm  F/u in 6 weeks or sooner prn  Total face-to-face time with patient was 20 min.  Greater than 50% was spent in counseling and coordination of care with the patient.   Marland Kitchen

## 2017-04-15 NOTE — Progress Notes (Signed)
Patient complaining of left sided abdominal pain. paitent states it increases throughout the day. Patient states generally it is the worst around 1-3pm. [name] RNBSN

## 2017-04-17 ENCOUNTER — Ambulatory Visit: Payer: BLUE CROSS/BLUE SHIELD | Admitting: Obstetrics & Gynecology

## 2017-04-17 LAB — CERVICOVAGINAL ANCILLARY ONLY
Chlamydia: NEGATIVE
Neisseria Gonorrhea: NEGATIVE

## 2017-05-27 ENCOUNTER — Ambulatory Visit (INDEPENDENT_AMBULATORY_CARE_PROVIDER_SITE_OTHER): Payer: BLUE CROSS/BLUE SHIELD | Admitting: Obstetrics & Gynecology

## 2017-05-27 ENCOUNTER — Encounter: Payer: Self-pay | Admitting: Obstetrics & Gynecology

## 2017-05-27 VITALS — BP 126/83 | HR 91 | Wt 189.0 lb

## 2017-05-27 DIAGNOSIS — N83202 Unspecified ovarian cyst, left side: Secondary | ICD-10-CM

## 2017-05-27 MED ORDER — NORETHINDRONE ACET-ETHINYL EST 1-20 MG-MCG PO TABS: 1.0000 | ORAL_TABLET | Freq: Every day | ORAL | 3 refills | Status: DC

## 2017-05-27 NOTE — Patient Instructions (Signed)
Irritable Bowel Syndrome, Adult Irritable bowel syndrome (IBS) is not one specific disease. It is a group of symptoms that affects the organs responsible for digestion (gastrointestinal or GI tract). To regulate how your GI tract works, your body sends signals back and forth between your intestines and your brain. If you have IBS, there may be a problem with these signals. As a result, your GI tract does not function normally. Your intestines may become more sensitive and overreact to certain things. This is especially true when you eat certain foods or when you are under stress. There are four types of IBS. These may be determined based on the consistency of your stool:  IBS with diarrhea.  IBS with constipation.  Mixed IBS.  Unsubtyped IBS.  It is important to know which type of IBS you have. Some treatments are more likely to be helpful for certain types of IBS. What are the causes? The exact cause of IBS is not known. What increases the risk? You may have a higher risk of IBS if:  You are a woman.  You are younger than 30 years old.  You have a family history of IBS.  You have mental health problems.  You have had bacterial infection of your GI tract.  What are the signs or symptoms? Symptoms of IBS vary from person to person. The main symptom is abdominal pain or discomfort. Additional symptoms usually include one or more of the following:  Diarrhea, constipation, or both.  Abdominal swelling or bloating.  Feeling full or sick after eating a small or regular-size meal.  Frequent gas.  Mucus in the stool.  A feeling of having more stool left after a bowel movement.  Symptoms tend to come and go. They may be associated with stress, psychiatric conditions, or nothing at all. How is this diagnosed? There is no specific test to diagnose IBS. Your health care provider will make a diagnosis based on a physical exam, medical history, and your symptoms. You may have other  tests to rule out other conditions that may be causing your symptoms. These may include:  Blood tests.  X-rays.  CT scan.  Endoscopy and colonoscopy. This is a test in which your GI tract is viewed with a long, thin, flexible tube.  How is this treated? There is no cure for IBS, but treatment can help relieve symptoms. IBS treatment often includes:  Changes to your diet, such as: ? Eating more fiber. ? Avoiding foods that cause symptoms. ? Drinking more water. ? Eating regular, medium-sized portioned meals.  Medicines. These may include: ? Fiber supplements if you have constipation. ? Medicine to control diarrhea (antidiarrheal medicines). ? Medicine to help control muscle spasms in your GI tract (antispasmodic medicines). ? Medicines to help with any mental health issues, such as antidepressants or tranquilizers.  Therapy. ? Talk therapy may help with anxiety, depression, or other mental health issues that can make IBS symptoms worse.  Stress reduction. ? Managing your stress can help keep symptoms under control.  Follow these instructions at home:  Take medicines only as directed by your health care provider.  Eat a healthy diet. ? Avoid foods and drinks with added sugar. ? Include more whole grains, fruits, and vegetables gradually into your diet. This may be especially helpful if you have IBS with constipation. ? Avoid any foods and drinks that make your symptoms worse. These may include dairy products and caffeinated or carbonated drinks. ? Do not eat large meals. ? Drink enough   fluid to keep your urine clear or pale yellow.  Exercise regularly. Ask your health care provider for recommendations of good activities for you.  Keep all follow-up visits as directed by your health care provider. This is important. Contact a health care provider if:  You have constant pain.  You have trouble or pain with swallowing.  You have worsening diarrhea. Get help right away  if:  You have severe and worsening abdominal pain.  You have diarrhea and: ? You have a rash, stiff neck, or severe headache. ? You are irritable, sleepy, or difficult to awaken. ? You are weak, dizzy, or extremely thirsty.  You have bright red blood in your stool or you have black tarry stools.  You have unusual abdominal swelling that is painful.  You vomit continuously.  You vomit blood (hematemesis).  You have both abdominal pain and a fever. This information is not intended to replace advice given to you by your health care provider. Make sure you discuss any questions you have with your health care provider. Document Released: 05/07/2005 Document Revised: 10/07/2015 Document Reviewed: 01/22/2014 Elsevier Interactive Patient Education  2018 [employer]vier Inc.  

## 2017-05-27 NOTE — Progress Notes (Signed)
History:  32 y.o. G0P0000 here today for f/u of LLQ pain. Pt reportst aht tthe pain is sl better but, it is still present. Pt reports that she had more pain when she is stressed. Pt reports BM every 2-3 days.    The following portions of the patient's history were reviewed and updated as appropriate: allergies, current medications, past family history, past medical history, past social history, past surgical history and problem list.  Review of Systems:  Pertinent items are noted in HPI.   Objective:  Physical Exam Blood pressure 126/83, pulse 91, weight 189 lb (85.7 kg), last menstrual period 05/06/2017. CONSTITUTIONAL: Well-developed, well-nourished female in no acute distress.  HENT:  Normocephalic, atraumatic EYES: Conjunctivae and EOM are normal. No scleral icterus.  NECK: Normal range of motion SKIN: Skin is warm and dry. No rash noted. Not diaphoretic.No pallor. [place]: Alert and oriented to person, place, and time. Normal coordination.  Abd: Soft, nontender and nondistended Pelvic: deferred   Labs and Imaging 04/15/2017 CLINICAL DATA:  Left lower quadrant pain for several months. Fibroids. Endometriosis.  EXAM: TRANSABDOMINAL AND TRANSVAGINAL ULTRASOUND OF PELVIS  TECHNIQUE: Both transabdominal and transvaginal ultrasound examinations of the pelvis were performed. Transabdominal technique was performed for global imaging of the pelvis including uterus, ovaries, adnexal regions, and pelvic cul-de-sac. It was necessary to proceed with endovaginal exam following the transabdominal exam to visualize the retroverted uterus.  COMPARISON:  04/02/2016  FINDINGS: Uterus  Measurements: 8.8 x 3.7 x 6.0 cm. Retroverted. Multiple small fibroids are seen within the corpus and fundus. These range in size from less than 1 cm to 3.4 cm in maximum diameter.  Endometrium  Thickness: 9 mm.  No focal abnormality visualized.  Right ovary  Measurements: 4.1 x 1.5 x 3.3  cm. Normal appearance/no adnexal mass.  Left ovary  Measurements: 4.3 x 2.1 x 4.0 cm. Normal appearance of ovary. 1.6 cm simple left paraovarian cyst with benign characteristics.  Other findings  No abnormal free fluid.  IMPRESSION: Retroverted uterus with multiple small fibroids measuring up to 3.4 cm.  1.6 cm benign-appearing left paraovarian cyst.   Assessment & Plan:   Left lower quad pain  Cont OCPs  Cont NSAIDs with pain (mod)  Ultram for severe pain only  F/u in 3 months or sooner prn  Miralax daily  Reviewed the results of her [place]  Total face-to-face time with patient was 20 min.  Greater than 50% was spent in counseling and coordination of care with the patient.   Anasha Perfecto L. Harraway-Smith, M.D., Cherlynn June

## 2017-09-25 ENCOUNTER — Encounter: Payer: Self-pay | Admitting: Obstetrics & Gynecology

## 2017-10-22 ENCOUNTER — Inpatient Hospital Stay (HOSPITAL_COMMUNITY): Payer: BLUE CROSS/BLUE SHIELD

## 2017-10-22 ENCOUNTER — Other Ambulatory Visit: Payer: Self-pay

## 2017-10-22 ENCOUNTER — Inpatient Hospital Stay (HOSPITAL_COMMUNITY)
Admission: AD | Admit: 2017-10-22 | Discharge: 2017-10-22 | Disposition: A | Discharge status: Home / Self Care | Payer: BLUE CROSS/BLUE SHIELD | Source: Ambulatory Visit | Attending: Obstetrics & Gynecology | Admitting: Obstetrics & Gynecology

## 2017-10-22 ENCOUNTER — Encounter (HOSPITAL_COMMUNITY): Payer: Self-pay | Admitting: *Deleted

## 2017-10-22 ENCOUNTER — Ambulatory Visit (INDEPENDENT_AMBULATORY_CARE_PROVIDER_SITE_OTHER): Payer: BLUE CROSS/BLUE SHIELD | Admitting: Advanced Practice Midwife

## 2017-10-22 ENCOUNTER — Encounter: Payer: Self-pay | Admitting: Advanced Practice Midwife

## 2017-10-22 VITALS — BP 115/81 | HR 81 | Wt 186.1 lb

## 2017-10-22 DIAGNOSIS — Z3A08 8 weeks gestation of pregnancy: Secondary | ICD-10-CM | POA: Insufficient documentation

## 2017-10-22 DIAGNOSIS — O021 Missed abortion: Secondary | ICD-10-CM | POA: Insufficient documentation

## 2017-10-22 DIAGNOSIS — D259 Leiomyoma of uterus, unspecified: Secondary | ICD-10-CM | POA: Insufficient documentation

## 2017-10-22 DIAGNOSIS — Z3490 Encounter for supervision of normal pregnancy, unspecified, unspecified trimester: Secondary | ICD-10-CM

## 2017-10-22 DIAGNOSIS — O26891 Other specified pregnancy related conditions, first trimester: Secondary | ICD-10-CM | POA: Insufficient documentation

## 2017-10-22 DIAGNOSIS — R109 Unspecified abdominal pain: Secondary | ICD-10-CM | POA: Insufficient documentation

## 2017-10-22 DIAGNOSIS — Z3201 Encounter for pregnancy test, result positive: Secondary | ICD-10-CM

## 2017-10-22 DIAGNOSIS — R102 Pelvic and perineal pain: Secondary | ICD-10-CM

## 2017-10-22 DIAGNOSIS — O3482 Maternal care for other abnormalities of pelvic organs, second trimester: Secondary | ICD-10-CM | POA: Insufficient documentation

## 2017-10-22 DIAGNOSIS — O3412 Maternal care for benign tumor of corpus uteri, second trimester: Secondary | ICD-10-CM | POA: Insufficient documentation

## 2017-10-22 DIAGNOSIS — N83202 Unspecified ovarian cyst, left side: Secondary | ICD-10-CM | POA: Diagnosis not present

## 2017-10-22 DIAGNOSIS — N8312 Corpus luteum cyst of left ovary: Secondary | ICD-10-CM | POA: Diagnosis not present

## 2017-10-22 DIAGNOSIS — O3680X0 Pregnancy with inconclusive fetal viability, not applicable or unspecified: Secondary | ICD-10-CM | POA: Insufficient documentation

## 2017-10-22 HISTORY — DX: Endometriosis, unspecified: N80.9

## 2017-10-22 HISTORY — DX: Unspecified ovarian cyst, unspecified side: N83.209

## 2017-10-22 LAB — WET PREP, GENITAL
CLUE CELLS WET PREP: NONE SEEN
SPERM: NONE SEEN
Trich, Wet Prep: NONE SEEN
Yeast Wet Prep HPF POC: NONE SEEN

## 2017-10-22 LAB — URINALYSIS, ROUTINE W REFLEX MICROSCOPIC
Bacteria, UA: NONE SEEN
Bilirubin Urine: NEGATIVE
GLUCOSE, UA: NEGATIVE mg/dL
HGB URINE DIPSTICK: NEGATIVE
Ketones, ur: NEGATIVE mg/dL
Nitrite: NEGATIVE
PH: 8 (ref 5.0–8.0)
PROTEIN: NEGATIVE mg/dL
SPECIFIC GRAVITY, URINE: 1.002 — AB (ref 1.005–1.030)

## 2017-10-22 LAB — HCG, QUANTITATIVE, PREGNANCY: hCG, Beta Chain, Quant, S: 31878 m[IU]/mL — ABNORMAL HIGH (ref <5)

## 2017-10-22 LAB — CBC
HEMATOCRIT: 36.2 % (ref 36.0–46.0)
Hemoglobin: 12.2 g/dL (ref 12.0–15.0)
MCH: 29.5 pg (ref 26.0–34.0)
MCHC: 33.7 g/dL (ref 30.0–36.0)
MCV: 87.7 fL (ref 78.0–100.0)
Platelets: 422 10*3/uL — ABNORMAL HIGH (ref 150–400)
RBC: 4.13 MIL/uL (ref 3.87–5.11)
RDW: 13.4 % (ref 11.5–15.5)
WBC: 12.6 10*3/uL — AB (ref 4.0–10.5)

## 2017-10-22 LAB — POCT URINE PREGNANCY: Preg Test, Ur: POSITIVE — AB

## 2017-10-22 MED ORDER — MISOPROSTOL 200 MCG PO TABS: ORAL_TABLET | ORAL | 1 refills | Status: DC

## 2017-10-22 MED ORDER — DICLOFENAC POTASSIUM 50 MG PO TABS: 50.0000 mg | ORAL_TABLET | Freq: Three times a day (TID) | ORAL | 1 refills | Status: DC | PRN

## 2017-10-22 NOTE — MAU Note (Signed)
Intermittent cramping in lower abd.  Had initial prenatal visit today.  Sent in to r/o ectopic. (no IUP visualized on [place]).  Denies any bleeding.

## 2017-10-22 NOTE — MAU Provider Note (Signed)
Chief Complaint: Abdominal Pain   First Provider Initiated Contact with Patient 10/22/17 1054     SUBJECTIVE HPI: [name] is a 32 y.o. G1P0000 at [redacted]w[redacted]d who presents to Maternity Admissions reporting: Low abdominal cramps times a few weeks.  Was seen at [facility] for new OB visit.  Informal ultrasound: IUP not visualized, questionable fluid collection in the adnexa. Patient's last menstrual period was 08/26/2017 (exact date).  Conceived on birth control pills.  Regular cycles while on birth control pills.  Irregular cycles prior to that.  Positive UPT, but no other testing this pregnancy.  Vaginal Bleeding: Denies Passage of tissue or clots: Denies Dizziness: Denies  O POS  Pain Location: Suprapubic Quality: Cramping Severity: 3/10 on pain scale Duration: Few weeks Course: Unchanged Context: Early pregnancy Timing: Intermittent Modifying factors: None.  Has not tried anything for the pain Associated signs and symptoms: Positive for nausea.  Negative for fever, chills, vaginal bleeding, vaginal discharge, vomiting, diarrhea, constipation or urinary complaints.  Past Medical History:  Diagnosis Date  . Endometriosis   . Ovarian cyst   . Uterine fibroid    OB History  Gravida Para Term Preterm AB Living  1 0 0 0 0 0  SAB TAB Ectopic Multiple Live Births  0 0 0 0 0    # Outcome Date GA Lbr Len/2nd Weight Sex Delivery Anes PTL Lv  1 Current            Past Surgical History:  Procedure Laterality Date  . APPENDECTOMY  03/15/11  . ROBOT ASSISTED MYOMECTOMY N/A 12/11/2012   Procedure: ROBOTIC ASSISTED MYOMECTOMY;  Surgeon: Marvene Staff, MD;  Location: [facility];  Service: Gynecology;  Laterality: N/A;   Social History   Socioeconomic History  . Marital status: Single    Spouse name: Not on file  . Number of children: Not on file  . Years of education: Not on file  . Highest education level: Not on file  Occupational History  . Not on file  Social  Needs  . Financial resource strain: Not on file  . Food insecurity:    Worry: Not on file    Inability: Not on file  . Transportation needs:    Medical: Not on file    Non-medical: Not on file  Tobacco Use  . Smoking status: Never Smoker  . Smokeless tobacco: Never Used  Substance and Sexual Activity  . Alcohol use: No  . Drug use: No  . Sexual activity: Yes    Birth control/protection: Pill  Lifestyle  . Physical activity:    Days per week: Not on file    Minutes per session: Not on file  . Stress: Not on file  Relationships  . Social connections:    Talks on phone: Not on file    Gets together: Not on file    Attends religious service: Not on file    Active member of club or organization: Not on file    Attends meetings of clubs or organizations: Not on file    Relationship status: Not on file  . Intimate partner violence:    Fear of current or ex partner: Not on file    Emotionally abused: Not on file    Physically abused: Not on file    Forced sexual activity: Not on file  Other Topics Concern  . Not on file  Social History Narrative  . Not on file   No current facility-administered medications on file prior to  encounter.    Current Outpatient Medications on File Prior to Encounter  Medication Sig Dispense Refill  . Prenatal Vit-Fe Fumarate-FA (PRENATAL MULTIVITAMIN) TABS tablet Take 1 tablet by mouth daily at 12 noon.    . [DISCONTINUED] Vitamin D, Ergocalciferol, (DRISDOL) 50000 units CAPS capsule Take 50,000 Units by mouth every 7 (seven) days.      No Known Allergies  I have reviewed the past Medical Hx, Surgical Hx, Social Hx, Allergies and Medications.   Review of Systems  Constitutional: Negative for appetite change, chills and fever.  Gastrointestinal: Positive for abdominal pain and nausea. Negative for abdominal distention, blood in stool, constipation, diarrhea and vomiting.  Genitourinary: Negative for dysuria, flank pain, frequency, hematuria,  urgency, vaginal bleeding and vaginal discharge.  Musculoskeletal: Negative for back pain.    OBJECTIVE Patient Vitals for the past 24 hrs:  BP Temp Temp src Pulse Resp SpO2 Height Weight  10/22/17 1046 124/86 99 F (37.2 C) Oral 83 18 100 % 5\' 3"  (1.6 m) 186 lb (84.4 kg)   Constitutional: Well-developed, well-nourished female in no acute distress.  Cardiovascular: normal rate Respiratory: normal rate and effort.  GI: Abd soft, non-tender. Pos BS x 4 MS: Extremities nontender, no edema, normal ROM Neurologic: Alert and oriented x 4.  GU:  PELVIC EXAM: NEFG, physiologic discharge, no blood noted, cervix. Blind GC/chlamydia cultures and wet prep collected.  LAB RESULTS Results for orders placed or performed during the hospital encounter of 10/22/17 (from the past 24 hour(s))  Urinalysis, Routine w reflex microscopic     Status: Abnormal   Collection Time: 10/22/17 10:47 AM  Result Value Ref Range   Color, Urine STRAW (A) YELLOW   APPearance CLEAR CLEAR   Specific Gravity, Urine 1.002 (L) 1.005 - 1.030   pH 8.0 5.0 - 8.0   Glucose, UA NEGATIVE NEGATIVE mg/dL   Hgb urine dipstick NEGATIVE NEGATIVE   Bilirubin Urine NEGATIVE NEGATIVE   Ketones, ur NEGATIVE NEGATIVE mg/dL   Protein, ur NEGATIVE NEGATIVE mg/dL   Nitrite NEGATIVE NEGATIVE   Leukocytes, UA TRACE (A) NEGATIVE   RBC / HPF 0-5 0 - 5 RBC/hpf   WBC, UA 0-5 0 - 5 WBC/hpf   Bacteria, UA NONE SEEN NONE SEEN   Squamous Epithelial / LPF 0-5 0 - 5  hCG, quantitative, pregnancy     Status: Abnormal   Collection Time: 10/22/17 10:52 AM  Result Value Ref Range   hCG, Beta Chain, Quant, S 31,878 (H) <5 mIU/mL  CBC     Status: Abnormal   Collection Time: 10/22/17 10:52 AM  Result Value Ref Range   WBC 12.6 (H) 4.0 - 10.5 K/uL   RBC 4.13 3.87 - 5.11 MIL/uL   Hemoglobin 12.2 12.0 - 15.0 g/dL   HCT 36.2 36.0 - 46.0 %   MCV 87.7 78.0 - 100.0 fL   MCH 29.5 26.0 - 34.0 pg   MCHC 33.7 30.0 - 36.0 g/dL   RDW 13.4 11.5 - 15.5 %    Platelets 422 (H) 150 - 400 K/uL  Wet prep, genital     Status: Abnormal   Collection Time: 10/22/17 11:03 AM  Result Value Ref Range   Yeast Wet Prep HPF POC NONE SEEN NONE SEEN   Trich, Wet Prep NONE SEEN NONE SEEN   Clue Cells Wet Prep HPF POC NONE SEEN NONE SEEN   WBC, Wet Prep HPF POC MANY (A) NONE SEEN   Sperm NONE SEEN     IMAGING US Ob Comp Less  14 Wks  Result Date: 10/22/2017 CLINICAL DATA:  First trimester pregnancy, abdominal pain, prior uterine myomectomies EXAM: OBSTETRIC <14 WK [place] AND TRANSVAGINAL OB US TECHNIQUE: Both transabdominal and transvaginal ultrasound examinations were performed for complete evaluation of the gestation as well as the maternal uterus, adnexal regions, and pelvic cul-de-sac. Transvaginal technique was performed to assess early pregnancy. COMPARISON:  None for this gestation FINDINGS: Intrauterine gestational sac: Present, mildly irregular margins Yolk sac:  Present Embryo:  Present Cardiac Activity: Absent Heart Rate: N/A  bpm CRL:  16.4 mm   8 w   0 d                  [place] EDC: Subchorionic hemorrhage:  None visualized. Maternal uterus/adnexae: Multiple uterine nodules identified consistent with leiomyomata. Measured leiomyomata include 4.8 x 3.6 x 3.0 cm LEFT laterally at lower uterine segment, 2.2 x 1.5 x 2.3 cm at LEFT mid uterus, and 2.3 x 1.6 x 2.1 cm at LEFT fundus. No free pelvic fluid or adnexal masses. LEFT ovary measures 3.1 x 5.8 x 2.8 cm and contains a dominant follicle 3.3 cm diameter. RIGHT ovary normal size and morphology, 2.2 x 3.4 x 1.7 cm. IMPRESSION: Single intrauterine gestation is identified though the gestational sac is irregular in contour and small versus size of observed fetal pole. No fetal cardiac activity is identified. Findings meet definitive criteria for failed pregnancy. This follows SRU consensus guidelines: Diagnostic Criteria for Nonviable Pregnancy Early in the First Trimester. Alison Stalling J Med [phone]. Multiple uterine  leiomyomata. Electronically Signed   By: Lavonia Dana M.D.   On: 10/22/2017 13:31   US Ob Transvaginal  Result Date: 10/22/2017 CLINICAL DATA:  First trimester pregnancy, abdominal pain, prior uterine myomectomies EXAM: OBSTETRIC <14 WK [place] AND TRANSVAGINAL OB US TECHNIQUE: Both transabdominal and transvaginal ultrasound examinations were performed for complete evaluation of the gestation as well as the maternal uterus, adnexal regions, and pelvic cul-de-sac. Transvaginal technique was performed to assess early pregnancy. COMPARISON:  None for this gestation FINDINGS: Intrauterine gestational sac: Present, mildly irregular margins Yolk sac:  Present Embryo:  Present Cardiac Activity: Absent Heart Rate: N/A  bpm CRL:  16.4 mm   8 w   0 d                  [place] EDC: Subchorionic hemorrhage:  None visualized. Maternal uterus/adnexae: Multiple uterine nodules identified consistent with leiomyomata. Measured leiomyomata include 4.8 x 3.6 x 3.0 cm LEFT laterally at lower uterine segment, 2.2 x 1.5 x 2.3 cm at LEFT mid uterus, and 2.3 x 1.6 x 2.1 cm at LEFT fundus. No free pelvic fluid or adnexal masses. LEFT ovary measures 3.1 x 5.8 x 2.8 cm and contains a dominant follicle 3.3 cm diameter. RIGHT ovary normal size and morphology, 2.2 x 3.4 x 1.7 cm. IMPRESSION: Single intrauterine gestation is identified though the gestational sac is irregular in contour and small versus size of observed fetal pole. No fetal cardiac activity is identified. Findings meet definitive criteria for failed pregnancy. This follows SRU consensus guidelines: Diagnostic Criteria for Nonviable Pregnancy Early in the First Trimester. Alison Stalling J Med [phone]. Multiple uterine leiomyomata. Electronically Signed   By: Lavonia Dana M.D.   On: 10/22/2017 13:31    MAU COURSE CBC, Quant, ABO/Rh, ultrasound, wet prep and GC/chlamydia culture, UA  MDM -Missed AB.  Discussed options for management of incomplete AB including expectant management,  Cytotec or D&C. Prefers expectant management at this time, but would  like prescription for Cytotec in case she decides to use it. Verbalizes understanding that intervention may become necessary if SAB in not completed spontaneously or if heavy bleeding or infection occur.  Support given.  Offered chaplain visit, declines.  ASSESSMENT 1. Missed abortion   2. Abdominal pain during pregnancy in first trimester   3. Uterine leiomyoma, unspecified location   4. Corpus luteum cyst of left ovary     PLAN Discharge home in stable condition. SAB precautions GC/chlamydia cultures pending Follow-up [facility] [place] [facility] up in 1 week(s).   Specialty:  Obstetrics and Gynecology Why:  For follow-up appointment after miscarriage Contact information: [place] [id number] [facility].   Why:  As needed in emergencies Contact information: [address] [id number] [place] [phone]          Allergies as of 10/22/2017   No Known Allergies     Medication List    TAKE these medications   diclofenac 50 MG tablet Commonly known as:  CATAFLAM Take 1 tablet (50 mg total) by mouth 3 (three) times daily as needed.   misoprostol 200 MCG tablet Commonly known as:  CYTOTEC Insert four tablets vaginally as instructed OR place four tablets in between your gums and cheeks (two tablets on each side). If you have not passed tissue in 24 hours take a second dose. If you have not passes tissue in 24 hours call the office to discuss D&C.   prenatal multivitamin Tabs tablet Take 1 tablet by mouth daily at 12 noon.        Tamala Julian, [place], [place] 10/22/2017  2:26 PM  4

## 2017-10-22 NOTE — Progress Notes (Signed)
Patient presents for New OB. Patient had positive pregnancy test at home.  Bedside ultrasound performed to and multiple fibroids seen and 2.5 cmx 2cm fluid collection visualized on patient's right side. No gestational sac visualized.  Patient does report some cramping but no bleeding. Patient was on OCPs when she conceived.  Kathrene Alu RN

## 2017-10-22 NOTE — Patient Instructions (Signed)
First Trimester of Pregnancy The first trimester of pregnancy is from week 1 until the end of week 13 (months 1 through 3). During this time, your baby will begin to develop inside you. At 6-8 weeks, the eyes and face are formed, and the heartbeat can be seen on ultrasound. At the end of 12 weeks, all the baby's organs are formed. Prenatal care is all the medical care you receive before the birth of your baby. Make sure you get good prenatal care and follow all of your doctor's instructions. Follow these instructions at home: Medicines  Take over-the-counter and prescription medicines only as told by your doctor. Some medicines are safe and some medicines are not safe during pregnancy.  Take a prenatal vitamin that contains at least 600 micrograms (mcg) of folic acid.  If you have trouble pooping (constipation), take medicine that will make your stool soft (stool softener) if your doctor approves. Eating and drinking  Eat regular, healthy meals.  Your doctor will tell you the amount of weight gain that is right for you.  Avoid raw meat and uncooked cheese.  If you feel sick to your stomach (nauseous) or throw up (vomit): ? Eat 4 or 5 small meals a day instead of 3 large meals. ? Try eating a few soda crackers. ? Drink liquids between meals instead of during meals.  To prevent constipation: ? Eat foods that are high in fiber, like fresh fruits and vegetables, whole grains, and beans. ? Drink enough fluids to keep your pee (urine) clear or pale yellow. Activity  Exercise only as told by your doctor. Stop exercising if you have cramps or pain in your lower belly (abdomen) or low back.  Do not exercise if it is too hot, too humid, or if you are in a place of great height (high altitude).  Try to avoid standing for long periods of time. Move your legs often if you must stand in one place for a long time.  Avoid heavy lifting.  Wear low-heeled shoes. Sit and stand up straight.  You  can have sex unless your doctor tells you not to. Relieving pain and discomfort  Wear a good support bra if your breasts are sore.  Take warm water baths (sitz baths) to soothe pain or discomfort caused by hemorrhoids. Use hemorrhoid cream if your doctor says it is okay.  Rest with your legs raised if you have leg cramps or low back pain.  If you have puffy, bulging veins (varicose veins) in your legs: ? Wear support hose or compression stockings as told by your doctor. ? Raise (elevate) your feet for 15 minutes, 3-4 times a day. ? Limit salt in your food. Prenatal care  Schedule your prenatal visits by the twelfth week of pregnancy.  Write down your questions. Take them to your prenatal visits.  Keep all your prenatal visits as told by your doctor. This is important. Safety  Wear your seat belt at all times when driving.  Make a list of emergency phone numbers. The list should include numbers for family, friends, the hospital, and police and fire departments. General instructions  Ask your doctor for a referral to a local prenatal class. Begin classes no later than at the start of month 6 of your pregnancy.  Ask for help if you need counseling or if you need help with nutrition. Your doctor can give you advice or tell you where to go for help.  Do not use hot tubs, steam rooms, or   saunas.  Do not douche or use tampons or scented sanitary pads.  Do not cross your legs for long periods of time.  Avoid all herbs and alcohol. Avoid drugs that are not approved by your doctor.  Do not use any tobacco products, including cigarettes, chewing tobacco, and electronic cigarettes. If you need help quitting, ask your doctor. You may get counseling or other support to help you quit.  Avoid cat litter boxes and soil used by cats. These carry germs that can cause birth defects in the baby and can cause a loss of your baby (miscarriage) or stillbirth.  Visit your dentist. At home, brush  your teeth with a soft toothbrush. Be gentle when you floss. Contact a doctor if:  You are dizzy.  You have mild cramps or pressure in your lower belly.  You have a nagging pain in your belly area.  You continue to feel sick to your stomach, you throw up, or you have watery poop (diarrhea).  You have a bad smelling fluid coming from your vagina.  You have pain when you pee (urinate).  You have increased puffiness (swelling) in your face, hands, legs, or ankles. Get help right away if:  You have a fever.  You are leaking fluid from your vagina.  You have spotting or bleeding from your vagina.  You have very bad belly cramping or pain.  You gain or lose weight rapidly.  You throw up blood. It may look like coffee grounds.  You are around people who have German measles, fifth disease, or chickenpox.  You have a very bad headache.  You have shortness of breath.  You have any kind of trauma, such as from a fall or a car accident. Summary  The first trimester of pregnancy is from week 1 until the end of week 13 (months 1 through 3).  To take care of yourself and your unborn baby, you will need to eat healthy meals, take medicines only if your doctor tells you to do so, and do activities that are safe for you and your baby.  Keep all follow-up visits as told by your doctor. This is important as your doctor will have to ensure that your baby is healthy and growing well. This information is not intended to replace advice given to you by your health care provider. Make sure you discuss any questions you have with your health care provider. Document Released: 10/24/2007 Document Revised: 05/15/2016 Document Reviewed: 05/15/2016 Elsevier Interactive Patient Education  2017 [employer]vier Inc.  

## 2017-10-22 NOTE — Progress Notes (Signed)
GYNECOLOGY CLINIC ANNUAL PREVENTATIVE CARE ENCOUNTER NOTE  Subjective:   [name] is a 32 y.o. G1P0000 female here for a New OB visit.    Current complaints: cramping which comes and goes.  No bleeding.  Has known fibroids, small, multiple per prior US.     Got pregnant on low dose OCPs.   Was put on them due to heavy and painful periods with fibroids.  States is "fine with whatever happens".  Due to get married in October.    Gynecologic History Patient's last menstrual period was 08/26/2017 (exact date). Contraception: OCP (estrogen/progesterone)   Obstetric History OB History  Gravida Para Term Preterm AB Living  1 0 0 0 0 0  SAB TAB Ectopic Multiple Live Births  0 0 0 0 0    # Outcome Date GA Lbr Len/2nd Weight Sex Delivery Anes PTL Lv  1 Current             Past Medical History:  Diagnosis Date  . Uterine fibroid     Past Surgical History:  Procedure Laterality Date  . APPENDECTOMY  03/15/11  . ROBOT ASSISTED MYOMECTOMY N/A 12/11/2012   Procedure: ROBOTIC ASSISTED MYOMECTOMY;  Surgeon: Marvene Staff, MD;  Location: [facility];  Service: Gynecology;  Laterality: N/A;    Current Outpatient Medications on File Prior to Visit  Medication Sig Dispense Refill  . [DISCONTINUED] Vitamin D, Ergocalciferol, (DRISDOL) 50000 units CAPS capsule Take 50,000 Units by mouth every 7 (seven) days.      No current facility-administered medications on file prior to visit.     No Known Allergies  Social History   Socioeconomic History  . Marital status: Single    Spouse name: Not on file  . Number of children: Not on file  . Years of education: Not on file  . Highest education level: Not on file  Occupational History  . Not on file  Social Needs  . Financial resource strain: Not on file  . Food insecurity:    Worry: Not on file    Inability: Not on file  . Transportation needs:    Medical: Not on file    Non-medical: Not on file  Tobacco Use  . Smoking  status: Never Smoker  . Smokeless tobacco: Never Used  Substance and Sexual Activity  . Alcohol use: No  . Drug use: No  . Sexual activity: Yes    Birth control/protection: Pill  Lifestyle  . Physical activity:    Days per week: Not on file    Minutes per session: Not on file  . Stress: Not on file  Relationships  . Social connections:    Talks on phone: Not on file    Gets together: Not on file    Attends religious service: Not on file    Active member of club or organization: Not on file    Attends meetings of clubs or organizations: Not on file    Relationship status: Not on file  . Intimate partner violence:    Fear of current or ex partner: Not on file    Emotionally abused: Not on file    Physically abused: Not on file    Forced sexual activity: Not on file  Other Topics Concern  . Not on file  Social History Narrative  . Not on file    Family History  Problem Relation Age of Onset  . Cancer Maternal Aunt        pt unaware of what kind  .  Cancer Maternal Grandmother        pt unawaure of what kinf    The following portions of the patient's history were reviewed and updated as appropriate: allergies, current medications, past family history, past medical history, past social history, past surgical history and problem list.  Review of Systems A comprehensive review of systems was negative except for: Gastrointestinal: positive for abdominal pain  Denies fever or chills Denies abnormal bleeding except amenorrhea Denies malaise or dizziness.   Objective:  BP 115/81   Pulse 81   Wt 186 lb 1.6 oz (84.4 kg)   LMP 08/26/2017 (Exact Date)   BMI 32.97 kg/m  CONSTITUTIONAL: Well-developed, well-nourished female in no acute distress.  NECK: Normal range of motion, supple,  SKIN: Skin is warm and dry. No rash noted. Not diaphoretic. No erythema. No pallor. [place]: Alert and oriented to person, place, and time. PSYCHIATRIC: Normal mood and affect. Normal behavior.  Normal judgment and thought content. CARDIOVASCULAR: Normal heart rate noted, regular rhythm RESPIRATORY: No dyspnea ABDOMEN: Soft, normal bowel sounds, no distention noted.  No tenderness, rebound or guarding.  Pelvic deferred MUSCULOSKELETAL: Normal range of motion.   [place] by RN showed multiple fibrids and Fluid collection in right adnexa No easily identifiable gestational sac  Assessment:  Pregnancy at [redacted]w[redacted]d by LMP Conceived on oral contraceptives Pregnancy of unknown location Intermittent pelvic pain   Plan:  Reviewed findings of [place] Reviewed plan of care for determining pregnancy status and location Will refer to [facility] for workup  V Smith CNM notified Follow up with new New OB Workup once pregnancy status is confirmed.

## 2017-10-22 NOTE — Discharge Instructions (Signed)
Incomplete Miscarriage A miscarriage is the sudden loss of an unborn baby (fetus) before the 20th week of pregnancy. In an incomplete miscarriage, parts of the fetus or placenta (afterbirth) remain in the body. Having a miscarriage can be an emotional experience. Talk with your health care provider about any questions you may have about miscarrying, the grieving process, and your future pregnancy plans. What are the causes?  Problems with the fetal chromosomes that make it impossible for the baby to develop normally. Problems with the baby's genes or chromosomes are most often the result of errors that occur by chance as the embryo divides and grows. The problems are not inherited from the parents.  Infection of the cervix or uterus.  Hormone problems.  Problems with the cervix, such as having an incompetent cervix. This is when the tissue in the cervix is not strong enough to hold the pregnancy.  Problems with the uterus, such as an abnormally shaped uterus, uterine fibroids, or congenital abnormalities.  Certain medical conditions.  Smoking, drinking alcohol, or taking illegal drugs.  Trauma. What are the signs or symptoms?  Vaginal bleeding or spotting, with or without cramps or pain.  Pain or cramping in the abdomen or lower back.  Passing fluid, tissue, or blood clots from the vagina. How is this diagnosed? Your health care provider will perform a physical exam. You may also have an ultrasound to confirm the miscarriage. Blood or urine tests may also be ordered. How is this treated?  Usually, a dilation and curettage (D&C) procedure is performed. During a D&C procedure, the cervix is widened (dilated) and any remaining fetal or placental tissue is gently removed from the uterus.  Antibiotic medicines are prescribed if there is an infection. Other medicines may be given to reduce the size of the uterus (contract) if there is a lot of bleeding.  If you have Rh negative blood and  your baby was Rh positive, you will need a Rho (D) immune globulin shot. This shot will protect any future baby from having Rh blood problems in future pregnancies.  You may be confined to bed rest. This means you should stay in bed and only get up to use the bathroom. Follow these instructions at home:  Rest as directed by your health care provider.  Restrict activity as directed by your health care provider. You may be allowed to continue light activity if curettage was not done but you require further treatment.  Keep track of the number of pads you use each day. Keep track of how soaked (saturated) they are. Record this information.  Do not  use tampons.  Do not douche or have sexual intercourse until approved by your health care provider.  Keep all follow-up appointments for reevaluation and continuing management.  Only take over-the-counter or prescription medicines for pain, fever, or discomfort as directed by your health care provider.  Take antibiotic medicine as directed by your health care provider. Make sure you finish it even if you start to feel better. Get help right away if:  You experience severe cramps in your stomach, back, or abdomen.  You have an unexplained temperature (make sure to record these temperatures).  You pass large clots or tissue (save these for your health care provider to inspect).  Your bleeding increases.  You become light-headed, weak, or have fainting episodes. This information is not intended to replace advice given to you by your health care provider. Make sure you discuss any questions you have with your health  care provider. Document Released: 05/07/2005 Document Revised: 10/13/2015 Document Reviewed: 12/04/2012 Elsevier Interactive Patient Education  2017 Elsevier Inc.   Uterine Fibroids Uterine fibroids are tissue masses (tumors) that can develop in the womb (uterus). They are also called leiomyomas. This type of tumor is not  cancerous (benign) and does not spread to other parts of the body outside of the pelvic area, which is between the hip bones. Occasionally, fibroids may develop in the fallopian tubes, in the cervix, or on the support structures (ligaments) that surround the uterus. You can have one or many fibroids. Fibroids can vary in size, weight, and where they grow in the uterus. Some can become quite large. Most fibroids do not require medical treatment. What are the causes? A fibroid can develop when a single uterine cell keeps growing (replicating). Most cells in the human body have a control mechanism that keeps them from replicating without control. What are the signs or symptoms? Symptoms may include:  Heavy bleeding during your period.  Bleeding or spotting between periods.  Pelvic pain and pressure.  Bladder problems, such as needing to urinate more often (urinary frequency) or urgently.  Inability to reproduce offspring (infertility).  Miscarriages.  How is this diagnosed? Uterine fibroids are diagnosed through a physical exam. Your health care provider may feel the lumpy tumors during a pelvic exam. Ultrasonography and an MRI may be done to determine the size, location, and number of fibroids. How is this treated? Treatment may include:  Watchful waiting. This involves getting the fibroid checked by your health care provider to see if it grows or shrinks. Follow your health care provider's recommendations for how often to have this checked.  Hormone medicines. These can be taken by mouth or given through an intrauterine device (IUD).  Surgery. ? Removing the fibroids (myomectomy) or the uterus (hysterectomy). ? Removing blood supply to the fibroids (uterine artery embolization).  If fibroids interfere with your fertility and you want to become pregnant, your health care provider may recommend having the fibroids removed. Follow these instructions at home:  Keep all follow-up visits  as directed by your health care provider. This is important.  Take over-the-counter and prescription medicines only as told by your health care provider. ? If you were prescribed a hormone treatment, take the hormone medicines exactly as directed.  Ask your health care provider about taking iron pills and increasing the amount of dark green, leafy vegetables in your diet. These actions can help to boost your blood iron levels, which may be affected by heavy menstrual bleeding.  Pay close attention to your period and tell your health care provider about any changes, such as: ? Increased blood flow that requires you to use more pads or tampons than usual per month. ? A change in the number of days that your period lasts per month. ? A change in symptoms that are associated with your period, such as abdominal cramping or back pain. Contact a health care provider if:  You have pelvic pain, back pain, or abdominal cramps that cannot be controlled with medicines.  You have an increase in bleeding between and during periods.  You soak tampons or pads in a half hour or less.  You feel lightheaded, extra tired, or weak. Get help right away if:  You faint.  You have a sudden increase in pelvic pain. This information is not intended to replace advice given to you by your health care provider. Make sure you discuss any questions you have with  your health care provider. Document Released: 05/04/2000 Document Revised: 01/05/2016 Document Reviewed: 11/03/2013 Elsevier Interactive Patient Education  [employer].

## 2017-10-23 LAB — HIV ANTIBODY (ROUTINE TESTING W REFLEX): HIV Screen 4th Generation wRfx: NONREACTIVE

## 2017-10-23 LAB — GC/CHLAMYDIA PROBE AMP (~~LOC~~) NOT AT ARMC
Chlamydia: NEGATIVE
Neisseria Gonorrhea: NEGATIVE

## 2017-10-23 NOTE — Addendum Note (Signed)
Addended by: Phill Myron on: 10/23/2017 09:48 AM   Modules accepted: Orders

## 2017-11-05 ENCOUNTER — Inpatient Hospital Stay (HOSPITAL_COMMUNITY)
Admission: AD | Admit: 2017-11-05 | Discharge: 2017-11-05 | Disposition: A | Discharge status: Home / Self Care | Payer: BLUE CROSS/BLUE SHIELD | Source: Ambulatory Visit | Attending: Obstetrics & Gynecology | Admitting: Obstetrics & Gynecology

## 2017-11-05 ENCOUNTER — Inpatient Hospital Stay (HOSPITAL_COMMUNITY): Payer: BLUE CROSS/BLUE SHIELD

## 2017-11-05 ENCOUNTER — Ambulatory Visit (INDEPENDENT_AMBULATORY_CARE_PROVIDER_SITE_OTHER): Payer: BLUE CROSS/BLUE SHIELD | Admitting: Advanced Practice Midwife

## 2017-11-05 ENCOUNTER — Encounter (HOSPITAL_COMMUNITY): Payer: Self-pay | Admitting: *Deleted

## 2017-11-05 ENCOUNTER — Encounter: Payer: Self-pay | Admitting: Advanced Practice Midwife

## 2017-11-05 VITALS — BP 115/74 | HR 99 | Temp 99.2°F | Ht 63.0 in | Wt 189.0 lb

## 2017-11-05 DIAGNOSIS — R109 Unspecified abdominal pain: Secondary | ICD-10-CM | POA: Diagnosis present

## 2017-11-05 DIAGNOSIS — D259 Leiomyoma of uterus, unspecified: Secondary | ICD-10-CM | POA: Diagnosis not present

## 2017-11-05 DIAGNOSIS — R509 Fever, unspecified: Secondary | ICD-10-CM | POA: Diagnosis not present

## 2017-11-05 DIAGNOSIS — O021 Missed abortion: Secondary | ICD-10-CM

## 2017-11-05 DIAGNOSIS — O039 Complete or unspecified spontaneous abortion without complication: Secondary | ICD-10-CM

## 2017-11-05 DIAGNOSIS — N939 Abnormal uterine and vaginal bleeding, unspecified: Secondary | ICD-10-CM

## 2017-11-05 LAB — CBC WITH DIFFERENTIAL/PLATELET
BASOS PCT: 0 %
Basophils Absolute: 0 10*3/uL (ref 0.0–0.1)
EOS ABS: 0.2 10*3/uL (ref 0.0–0.7)
EOS PCT: 2 %
HCT: 31.8 % — ABNORMAL LOW (ref 36.0–46.0)
Hemoglobin: 10.5 g/dL — ABNORMAL LOW (ref 12.0–15.0)
Lymphocytes Relative: 14 %
Lymphs Abs: 1.5 10*3/uL (ref 0.7–4.0)
MCH: 28.9 pg (ref 26.0–34.0)
MCHC: 33 g/dL (ref 30.0–36.0)
MCV: 87.6 fL (ref 78.0–100.0)
Monocytes Absolute: 0.9 10*3/uL (ref 0.1–1.0)
Monocytes Relative: 8 %
Neutro Abs: 8.1 10*3/uL — ABNORMAL HIGH (ref 1.7–7.7)
Neutrophils Relative %: 76 %
PLATELETS: 260 10*3/uL (ref 150–400)
RBC: 3.63 MIL/uL — AB (ref 3.87–5.11)
RDW: 14.1 % (ref 11.5–15.5)
WBC: 10.6 10*3/uL — AB (ref 4.0–10.5)

## 2017-11-05 LAB — URINALYSIS, ROUTINE W REFLEX MICROSCOPIC
Bilirubin Urine: NEGATIVE
Glucose, UA: NEGATIVE mg/dL
Ketones, ur: NEGATIVE mg/dL
Nitrite: NEGATIVE
PH: 5 (ref 5.0–8.0)
Protein, ur: 30 mg/dL — AB
Specific Gravity, Urine: 1.02 (ref 1.005–1.030)

## 2017-11-05 MED ORDER — AZITHROMYCIN 500 MG PO TABS: 1000.0000 mg | ORAL_TABLET | Freq: Once | ORAL | 0 refills | Status: AC

## 2017-11-05 NOTE — Progress Notes (Signed)
   Subjective:    Patient ID: [name], female    DOB: Oct 19, 1985, 32 y.o.   MRN: [id number]  This is a 32 y.o. female who presents s/p diagnosis of Missed abortion on 10/22/17.  Elected to proceed with Cytotec over the weekend but has continued to have bleeding and fever/chills since Friday.  Had her blood checked at outside office and WBC was 16.  NPO since yesterday afternoon  Vaginal Bleeding  The patient's primary symptoms include pelvic pain and vaginal bleeding. The patient's pertinent negatives include no genital itching, genital lesions or genital odor. This is a recurrent problem. The current episode started in the past 7 days. The problem occurs intermittently. The problem has been unchanged. The pain is moderate. The problem affects both sides. Associated symptoms include abdominal pain, chills and a fever. Pertinent negatives include no constipation, diarrhea, headaches, nausea or vomiting. The vaginal discharge was bloody. The vaginal bleeding is heavier than menses. She has been passing clots. She has been passing tissue. Nothing aggravates the symptoms. She has tried acetaminophen and NSAIDs for the symptoms. The treatment provided mild relief.      Review of Systems  Constitutional: Positive for chills, fatigue and fever.  Gastrointestinal: Positive for abdominal pain. Negative for constipation, diarrhea, nausea and vomiting.  Genitourinary: Positive for pelvic pain and vaginal bleeding.  Neurological: Negative for dizziness and headaches.       Objective:   Physical Exam  Constitutional: She is oriented to person, place, and time. She appears well-developed and well-nourished. No distress.  HENT:  Head: Normocephalic.  Cardiovascular: Normal rate.  Pulmonary/Chest: Effort normal. No respiratory distress.  Abdominal: Soft. She exhibits no distension and no mass. There is tenderness. There is no rebound and no guarding.  Genitourinary: Vaginal discharge found.    Genitourinary Comments: Cervix open 1cm and long Small amount of blood in vault Uterus tender  Musculoskeletal: Normal range of motion.  Neurological: She is alert and oriented to person, place, and time.  Skin: Skin is warm and dry.  Psychiatric: She has a normal mood and affect.    CBC at her work office yesterday WBC 16.1 Hgb 10.5 Hct 34.4 Platelets 223      Assessment & Plan:  Single IUP at [redacted]w[redacted]d Missed abortion, s/p Cytotec Concern for retained products of conception and/or endometritis  Discussed with Dr Harolyn Rutherford Will check Quant HCG and [place] If retained POC, pt prefers D&E NPO since yesterday afternoon.

## 2017-11-05 NOTE — [facility] Note (Signed)
Pt seen at office today for follow up s/p missed ab last week. For the last few days has had increase bleeding and pain.

## 2017-11-05 NOTE — Patient Instructions (Signed)

## 2017-11-05 NOTE — Discharge Instructions (Signed)

## 2017-11-05 NOTE — Progress Notes (Signed)
Patient following up after missed AB. Patient states that she did take cytotec on Thursday 10-31-17. Patient states she had some heavy bleeding Sunday with clots. Patient states she did have chills yesterday.

## 2017-11-05 NOTE — MAU Provider Note (Signed)
History     CSN: [id number]  Arrival date and time: 11/05/17 1601   First Provider Initiated Contact with Patient 11/05/17 [phone]      Chief Complaint  Patient presents with  . Abdominal Pain  . Vaginal Bleeding   HPI [name] is a 32 y.o. G1P0010 at [redacted]w[redacted]d who presents to MAU today from the [facility] for further evaluation of abdominal pain, vaginal bleeding and fever/chills since using Cytotec for missed AB. Upon arrival she states abdominal pain is 4/10. She has not taken anything for pain today. She states bleeding is light to moderate. She had significant bleeding following the Cytotec.   OB History    Gravida  1   Para  0   Term  0   Preterm  0   AB  1   Living  0     SAB  1   TAB  0   Ectopic  0   Multiple  0   Live Births  0           Past Medical History:  Diagnosis Date  . Endometriosis   . Ovarian cyst   . Uterine fibroid     Past Surgical History:  Procedure Laterality Date  . APPENDECTOMY  03/15/11  . ROBOT ASSISTED MYOMECTOMY N/A 12/11/2012   Procedure: ROBOTIC ASSISTED MYOMECTOMY;  Surgeon: Marvene Staff, MD;  Location: [facility];  Service: Gynecology;  Laterality: N/A;    Family History  Problem Relation Age of Onset  . Cancer Maternal Aunt        pt unaware of what kind  . Cancer Maternal Grandmother        pt unawaure of what kinf    Social History   Tobacco Use  . Smoking status: Never Smoker  . Smokeless tobacco: Never Used  Substance Use Topics  . Alcohol use: No  . Drug use: No    Allergies: No Known Allergies  No medications prior to admission.    Review of Systems  Constitutional: Positive for fever.  Gastrointestinal: Positive for abdominal pain. Negative for constipation, diarrhea, nausea and vomiting.  Genitourinary: Positive for vaginal bleeding. Negative for dysuria, frequency, urgency and vaginal discharge.   Physical Exam   Blood pressure 109/82, pulse 83, temperature 98 F (36.7 C),  temperature source Oral, resp. rate 18, height 5\' 3"  (1.6 m), weight 189 lb (85.7 kg), last menstrual period 08/26/2017, SpO2 100 %, unknown if currently breastfeeding.  Physical Exam  Nursing note and vitals reviewed. Constitutional: She is oriented to person, place, and time. She appears well-developed and well-nourished. No distress.  HENT:  Head: Normocephalic and atraumatic.  Cardiovascular: Normal rate.  Respiratory: Effort normal.  GI: Soft. She exhibits no distension. There is no tenderness.  Neurological: She is alert and oriented to person, place, and time.  Skin: Skin is warm and dry. No erythema.  Psychiatric: She has a normal mood and affect.     Results for orders placed or performed during the hospital encounter of 11/05/17 (from the past 24 hour(s))  Urinalysis, Routine w reflex microscopic     Status: Abnormal   Collection Time: 11/05/17  9:45 AM  Result Value Ref Range   Color, Urine YELLOW YELLOW   APPearance HAZY (A) CLEAR   Specific Gravity, Urine 1.020 1.005 - 1.030   pH 5.0 5.0 - 8.0   Glucose, UA NEGATIVE NEGATIVE mg/dL   Hgb urine dipstick MODERATE (A) NEGATIVE   Bilirubin Urine NEGATIVE NEGATIVE  Ketones, ur NEGATIVE NEGATIVE mg/dL   Protein, ur 30 (A) NEGATIVE mg/dL   Nitrite NEGATIVE NEGATIVE   Leukocytes, UA MODERATE (A) NEGATIVE   RBC / HPF 11-20 0 - 5 RBC/hpf   WBC, UA 21-50 0 - 5 WBC/hpf   Bacteria, UA RARE (A) NONE SEEN   Squamous Epithelial / LPF 6-10 0 - 5   Mucus PRESENT   CBC with Differential/Platelet     Status: Abnormal   Collection Time: 11/05/17  9:54 AM  Result Value Ref Range   WBC 10.6 (H) 4.0 - 10.5 K/uL   RBC 3.63 (L) 3.87 - 5.11 MIL/uL   Hemoglobin 10.5 (L) 12.0 - 15.0 g/dL   HCT 31.8 (L) 36.0 - 46.0 %   MCV 87.6 78.0 - 100.0 fL   MCH 28.9 26.0 - 34.0 pg   MCHC 33.0 30.0 - 36.0 g/dL   RDW 14.1 11.5 - 15.5 %   Platelets 260 150 - 400 K/uL   Neutrophils Relative % 76 %   Neutro Abs 8.1 (H) 1.7 - 7.7 K/uL   Lymphocytes  Relative 14 %   Lymphs Abs 1.5 0.7 - 4.0 K/uL   Monocytes Relative 8 %   Monocytes Absolute 0.9 0.1 - 1.0 K/uL   Eosinophils Relative 2 %   Eosinophils Absolute 0.2 0.0 - 0.7 K/uL   Basophils Relative 0 %   Basophils Absolute 0.0 0.0 - 0.1 K/uL    US Ob Comp Less 14 Wks  Result Date: 10/22/2017 CLINICAL DATA:  First trimester pregnancy, abdominal pain, prior uterine myomectomies EXAM: OBSTETRIC <14 WK [place] AND TRANSVAGINAL OB US TECHNIQUE: Both transabdominal and transvaginal ultrasound examinations were performed for complete evaluation of the gestation as well as the maternal uterus, adnexal regions, and pelvic cul-de-sac. Transvaginal technique was performed to assess early pregnancy. COMPARISON:  None for this gestation FINDINGS: Intrauterine gestational sac: Present, mildly irregular margins Yolk sac:  Present Embryo:  Present Cardiac Activity: Absent Heart Rate: N/A  bpm CRL:  16.4 mm   8 w   0 d                  [place] EDC: Subchorionic hemorrhage:  None visualized. Maternal uterus/adnexae: Multiple uterine nodules identified consistent with leiomyomata. Measured leiomyomata include 4.8 x 3.6 x 3.0 cm LEFT laterally at lower uterine segment, 2.2 x 1.5 x 2.3 cm at LEFT mid uterus, and 2.3 x 1.6 x 2.1 cm at LEFT fundus. No free pelvic fluid or adnexal masses. LEFT ovary measures 3.1 x 5.8 x 2.8 cm and contains a dominant follicle 3.3 cm diameter. RIGHT ovary normal size and morphology, 2.2 x 3.4 x 1.7 cm. IMPRESSION: Single intrauterine gestation is identified though the gestational sac is irregular in contour and small versus size of observed fetal pole. No fetal cardiac activity is identified. Findings meet definitive criteria for failed pregnancy. This follows SRU consensus guidelines: Diagnostic Criteria for Nonviable Pregnancy Early in the First Trimester. [name] J Med [phone]. Multiple uterine leiomyomata. Electronically Signed   By: Lavonia Dana M.D.   On: 10/22/2017 13:31   US Ob  Transvaginal  Result Date: 10/22/2017 CLINICAL DATA:  First trimester pregnancy, abdominal pain, prior uterine myomectomies EXAM: OBSTETRIC <14 WK [place] AND TRANSVAGINAL OB US TECHNIQUE: Both transabdominal and transvaginal ultrasound examinations were performed for complete evaluation of the gestation as well as the maternal uterus, adnexal regions, and pelvic cul-de-sac. Transvaginal technique was performed to assess early pregnancy. COMPARISON:  None for this gestation FINDINGS: Intrauterine  gestational sac: Present, mildly irregular margins Yolk sac:  Present Embryo:  Present Cardiac Activity: Absent Heart Rate: N/A  bpm CRL:  16.4 mm   8 w   0 d                  [place] EDC: Subchorionic hemorrhage:  None visualized. Maternal uterus/adnexae: Multiple uterine nodules identified consistent with leiomyomata. Measured leiomyomata include 4.8 x 3.6 x 3.0 cm LEFT laterally at lower uterine segment, 2.2 x 1.5 x 2.3 cm at LEFT mid uterus, and 2.3 x 1.6 x 2.1 cm at LEFT fundus. No free pelvic fluid or adnexal masses. LEFT ovary measures 3.1 x 5.8 x 2.8 cm and contains a dominant follicle 3.3 cm diameter. RIGHT ovary normal size and morphology, 2.2 x 3.4 x 1.7 cm. IMPRESSION: Single intrauterine gestation is identified though the gestational sac is irregular in contour and small versus size of observed fetal pole. No fetal cardiac activity is identified. Findings meet definitive criteria for failed pregnancy. This follows SRU consensus guidelines: Diagnostic Criteria for Nonviable Pregnancy Early in the First Trimester. [name] J Med [phone]. Multiple uterine leiomyomata. Electronically Signed   By: Lavonia Dana M.D.   On: 10/22/2017 13:31   US Pelvis Transvanginal Non-ob (tv Only)  Result Date: 11/05/2017 CLINICAL DATA:  Missed abortion, question retained products EXAM: ULTRASOUND PELVIS TRANSVAGINAL TECHNIQUE: Transvaginal ultrasound examination of the pelvis was performed including evaluation of the uterus,  ovaries, adnexal regions, and pelvic cul-de-sac. COMPARISON:  None. FINDINGS: Uterus Measurements: 10.3 x 4.9 x 8.9 cm. Multiple fibroids, the largest a degenerated left lower uterine segment fibroid measuring 4 cm. Endometrium Thickness: 12 mm in thickness, homogeneous. No focal abnormality visualized. Right ovary Measurements: 3.9 x 2.4 x 2.0 cm. Normal appearance/no adnexal mass. Left ovary Measurements: 4.2 x 2.9 x 3.8 cm. Normal appearance/no adnexal mass. Other findings:  No abnormal free fluid IMPRESSION: No visible retained products of conception. Endometrium normal thickness at 12 mm. Multiple uterine fibroids. No adnexal mass. Electronically Signed   By: Rolm Baptise M.D.   On: 11/05/2017 10:46     MAU Course  Procedures None  MDM CBC, [place] today  Discussed with Dr. Harolyn Rutherford. She recommends 1 G Azithromycin for potential infection due to recent reported fevers following Cytote and follow-up in ~ 2 weeks at [facility] Assessment and Plan  A: Recent miscarriage Fever   P: Discharge home Rx for Azithromycin given to aptient  Warning signs for worsening condition discussed Patient advised to follow-up with [facility] in ~ 2 weeks or sooner PRN Patient may return to MAU as needed or if her condition were to change or worsen  Kerry Hough, PA-C 11/05/2017, 11:59 AM

## 2017-11-18 ENCOUNTER — Encounter: Payer: Self-pay | Admitting: Advanced Practice Midwife

## 2017-11-26 ENCOUNTER — Ambulatory Visit: Payer: BLUE CROSS/BLUE SHIELD | Admitting: Advanced Practice Midwife

## 2017-11-26 DIAGNOSIS — Z09 Encounter for follow-up examination after completed treatment for conditions other than malignant neoplasm: Secondary | ICD-10-CM

## 2018-03-05 IMAGING — US US TRANSVAGINAL NON-OB
1 series · 13 of 25 positions shown · non-contrast
Comparison: 04/02/2016

CLINICAL DATA: Left lower quadrant pain for several months.
Fibroids. Endometriosis.

EXAM:
TRANSABDOMINAL AND TRANSVAGINAL ULTRASOUND OF PELVIS
TECHNIQUE: Both transabdominal and transvaginal ultrasound examinations of the
pelvis were performed. Transabdominal technique was performed for
global imaging of the pelvis including uterus, ovaries, adnexal
regions, and pelvic cul-de-sac. It was necessary to proceed with
endovaginal exam following the transabdominal exam to visualize the
retroverted uterus.

[Series 1: us transvaginal non-ob · 0.24mm/px · 13 of 134 slices shown]
[im 1/134]
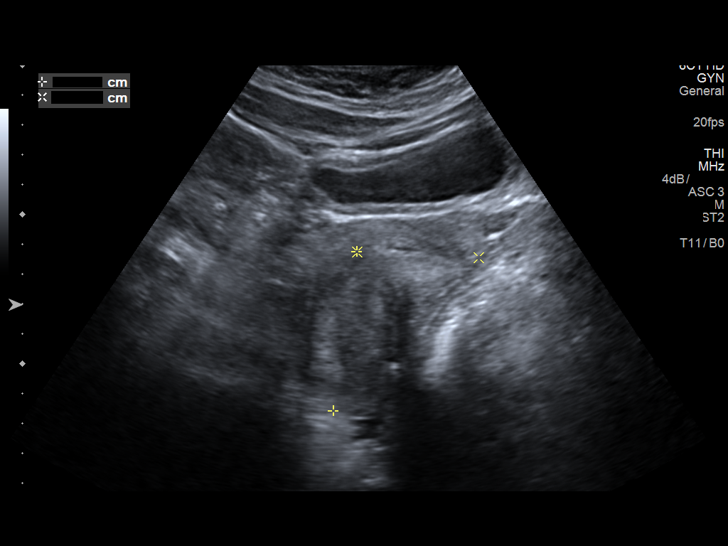
[im 12/134]
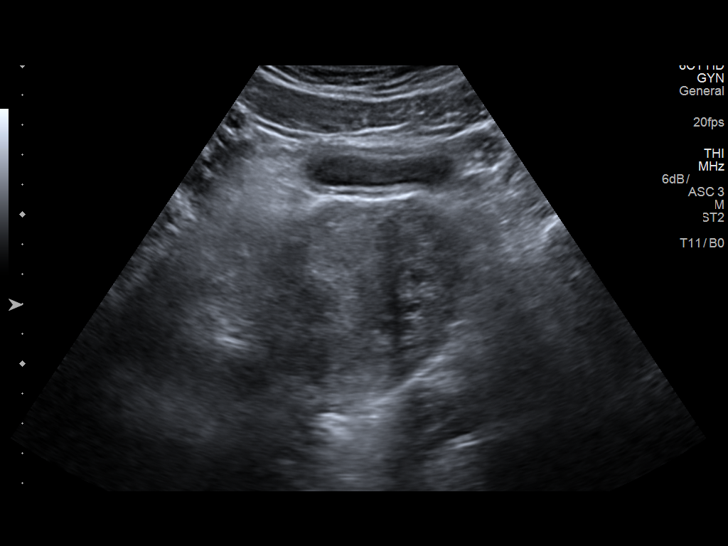
[im 23/134]
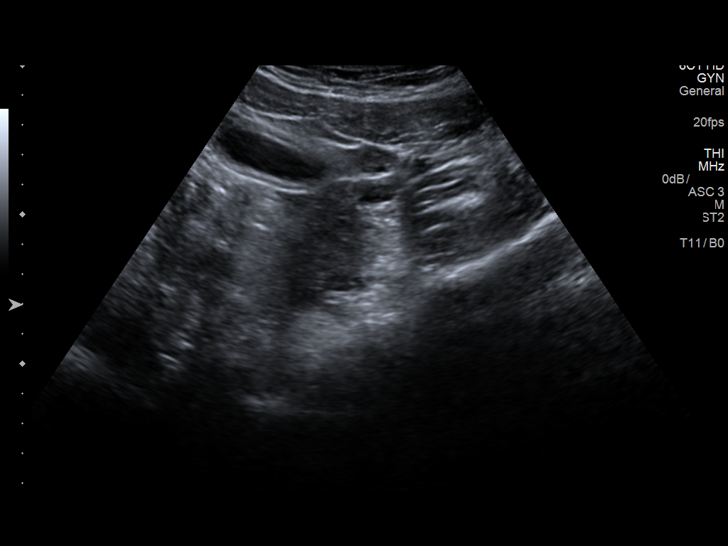
[im 34/134]
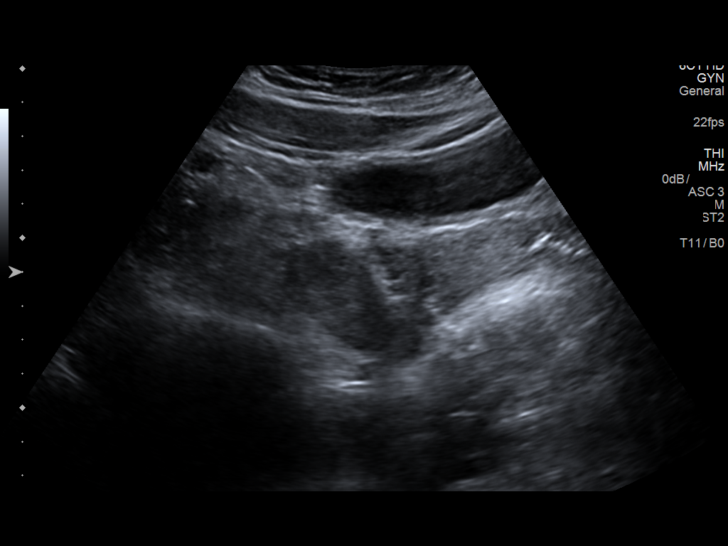
[im 45/134]
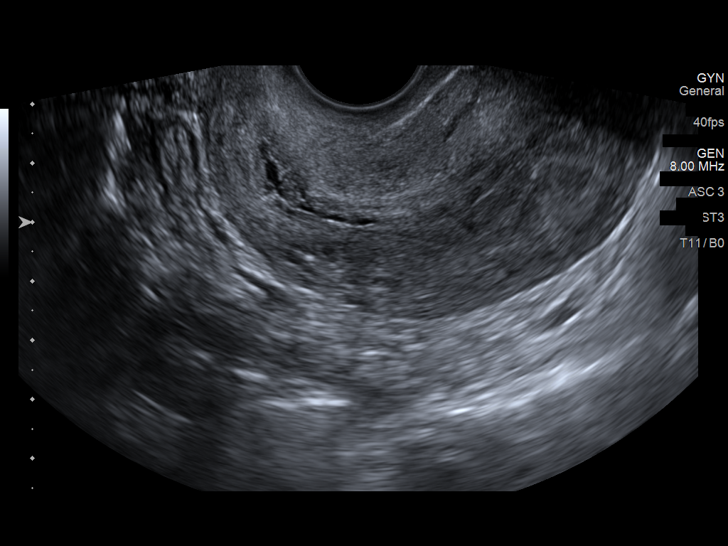
[im 56/134]
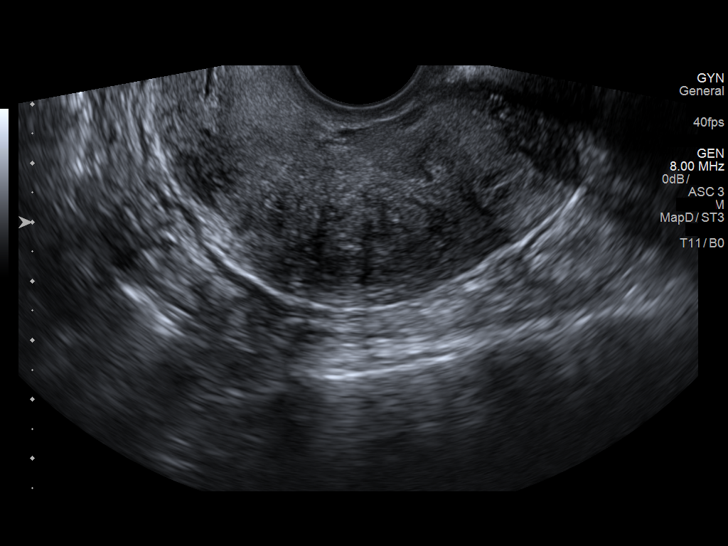
[im 67/134]
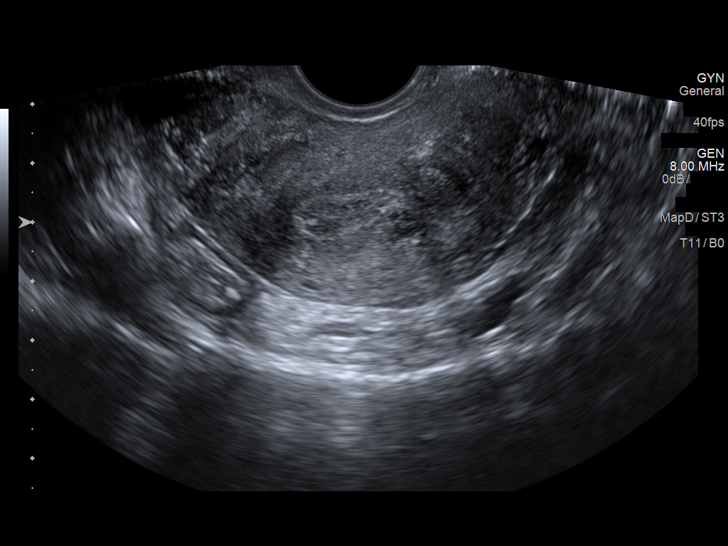
[im 78/134]
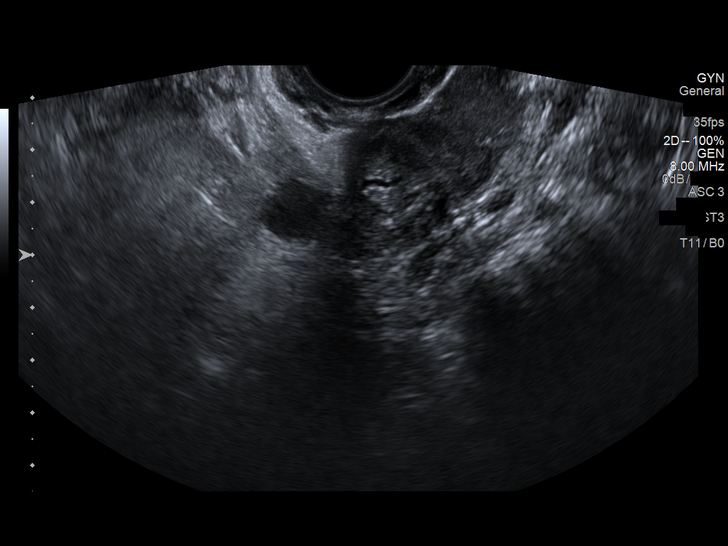
[im 89/134]
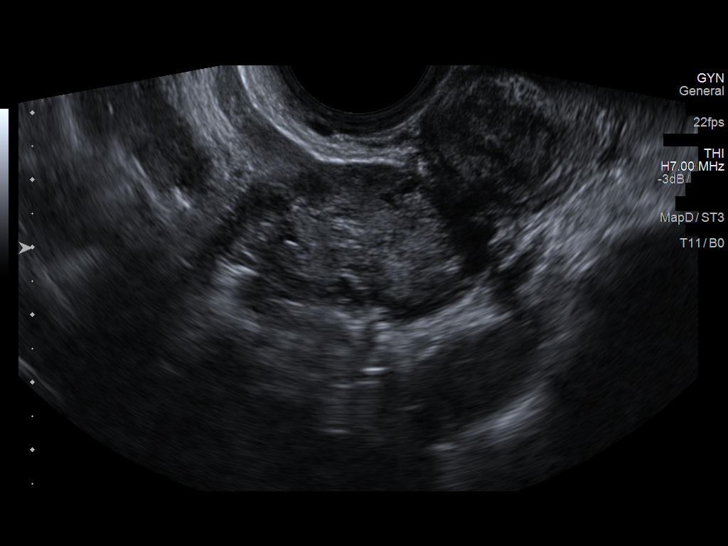
[im 100/134]
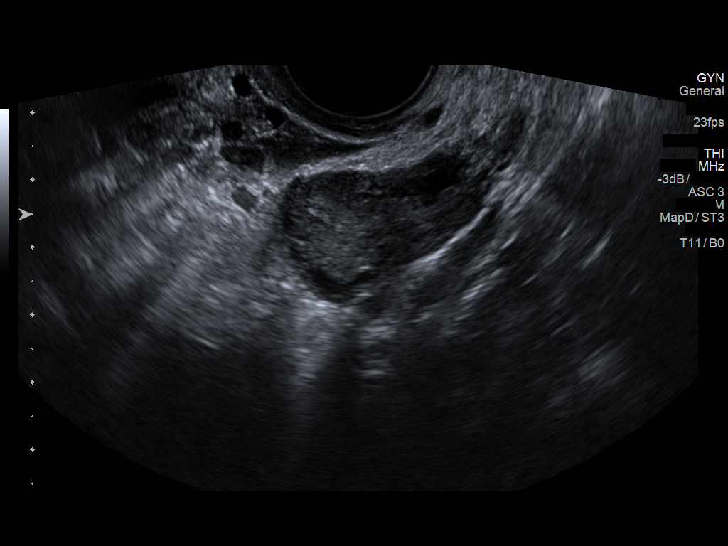
[im 111/134]
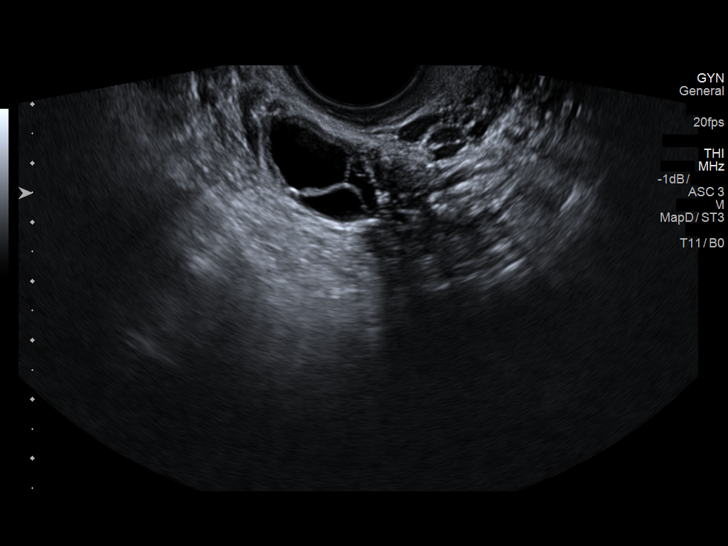
[im 122/134]
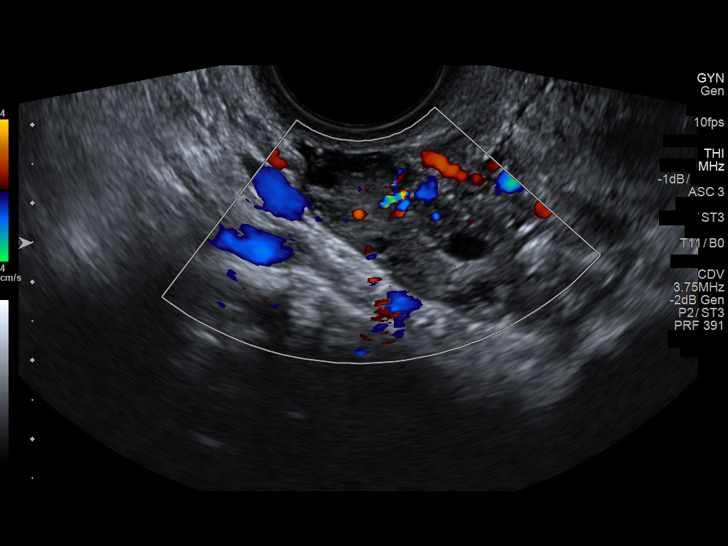
[im 134/134]
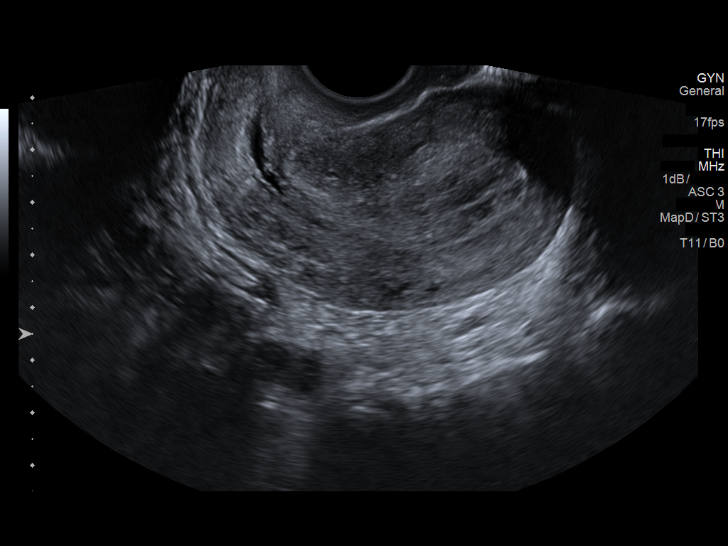

[13 of 25 positions shown; findings below may reference images not displayed]

FINDINGS: Uterus

Measurements: 8.8 x 3.7 x 6.0 cm. Retroverted. Multiple small
fibroids are seen within the corpus and fundus. These range in size
from less than 1 cm to 3.4 cm in maximum diameter.

Endometrium

Thickness: 9 mm.  No focal abnormality visualized.

Right ovary

Measurements: 4.1 x 1.5 x 3.3 cm. Normal appearance/no adnexal mass.

Left ovary

Measurements: 4.3 x 2.1 x 4.0 cm. Normal appearance of ovary. 1.6 cm
simple left paraovarian cyst with benign characteristics.

Other findings

No abnormal free fluid.
IMPRESSION: Retroverted uterus with multiple small fibroids measuring up to
cm.

1.6 cm benign-appearing left paraovarian cyst.

## 2018-05-06 ENCOUNTER — Ambulatory Visit (INDEPENDENT_AMBULATORY_CARE_PROVIDER_SITE_OTHER): Payer: BLUE CROSS/BLUE SHIELD | Admitting: Advanced Practice Midwife

## 2018-05-06 ENCOUNTER — Encounter: Payer: Self-pay | Admitting: Advanced Practice Midwife

## 2018-05-06 VITALS — BP 111/79 | HR 87 | Wt 195.0 lb

## 2018-05-06 DIAGNOSIS — Z113 Encounter for screening for infections with a predominantly sexual mode of transmission: Secondary | ICD-10-CM

## 2018-05-06 DIAGNOSIS — D259 Leiomyoma of uterus, unspecified: Secondary | ICD-10-CM

## 2018-05-06 DIAGNOSIS — Z1151 Encounter for screening for human papillomavirus (HPV): Secondary | ICD-10-CM | POA: Diagnosis not present

## 2018-05-06 DIAGNOSIS — Z3481 Encounter for supervision of other normal pregnancy, first trimester: Secondary | ICD-10-CM | POA: Diagnosis not present

## 2018-05-06 DIAGNOSIS — D219 Benign neoplasm of connective and other soft tissue, unspecified: Secondary | ICD-10-CM

## 2018-05-06 DIAGNOSIS — Z124 Encounter for screening for malignant neoplasm of cervix: Secondary | ICD-10-CM | POA: Diagnosis not present

## 2018-05-06 DIAGNOSIS — Z3480 Encounter for supervision of other normal pregnancy, unspecified trimester: Secondary | ICD-10-CM | POA: Diagnosis not present

## 2018-05-06 NOTE — Patient Instructions (Signed)
First Trimester of Pregnancy The first trimester of pregnancy is from week 1 until the end of week 13 (months 1 through 3). During this time, your baby will begin to develop inside you. At 6-8 weeks, the eyes and face are formed, and the heartbeat can be seen on ultrasound. At the end of 12 weeks, all the baby's organs are formed. Prenatal care is all the medical care you receive before the birth of your baby. Make sure you get good prenatal care and follow all of your doctor's instructions. Follow these instructions at home: Medicines  Take over-the-counter and prescription medicines only as told by your doctor. Some medicines are safe and some medicines are not safe during pregnancy.  Take a prenatal vitamin that contains at least 600 micrograms (mcg) of folic acid.  If you have trouble pooping (constipation), take medicine that will make your stool soft (stool softener) if your doctor approves. Eating and drinking  Eat regular, healthy meals.  Your doctor will tell you the amount of weight gain that is right for you.  Avoid raw meat and uncooked cheese.  If you feel sick to your stomach (nauseous) or throw up (vomit): ? Eat 4 or 5 small meals a day instead of 3 large meals. ? Try eating a few soda crackers. ? Drink liquids between meals instead of during meals.  To prevent constipation: ? Eat foods that are high in fiber, like fresh fruits and vegetables, whole grains, and beans. ? Drink enough fluids to keep your pee (urine) clear or pale yellow. Activity  Exercise only as told by your doctor. Stop exercising if you have cramps or pain in your lower belly (abdomen) or low back.  Do not exercise if it is too hot, too humid, or if you are in a place of great height (high altitude).  Try to avoid standing for long periods of time. Move your legs often if you must stand in one place for a long time.  Avoid heavy lifting.  Wear low-heeled shoes. Sit and stand up straight.  You  can have sex unless your doctor tells you not to. Relieving pain and discomfort  Wear a good support bra if your breasts are sore.  Take warm water baths (sitz baths) to soothe pain or discomfort caused by hemorrhoids. Use hemorrhoid cream if your doctor says it is okay.  Rest with your legs raised if you have leg cramps or low back pain.  If you have puffy, bulging veins (varicose veins) in your legs: ? Wear support hose or compression stockings as told by your doctor. ? Raise (elevate) your feet for 15 minutes, 3-4 times a day. ? Limit salt in your food. Prenatal care  Schedule your prenatal visits by the twelfth week of pregnancy.  Write down your questions. Take them to your prenatal visits.  Keep all your prenatal visits as told by your doctor. This is important. Safety  Wear your seat belt at all times when driving.  Make a list of emergency phone numbers. The list should include numbers for family, friends, the hospital, and police and fire departments. General instructions  Ask your doctor for a referral to a local prenatal class. Begin classes no later than at the start of month 6 of your pregnancy.  Ask for help if you need counseling or if you need help with nutrition. Your doctor can give you advice or tell you where to go for help.  Do not use hot tubs, steam rooms, or   saunas.  Do not douche or use tampons or scented sanitary pads.  Do not cross your legs for long periods of time.  Avoid all herbs and alcohol. Avoid drugs that are not approved by your doctor.  Do not use any tobacco products, including cigarettes, chewing tobacco, and electronic cigarettes. If you need help quitting, ask your doctor. You may get counseling or other support to help you quit.  Avoid cat litter boxes and soil used by cats. These carry germs that can cause birth defects in the baby and can cause a loss of your baby (miscarriage) or stillbirth.  Visit your dentist. At home, brush  your teeth with a soft toothbrush. Be gentle when you floss. Contact a doctor if:  You are dizzy.  You have mild cramps or pressure in your lower belly.  You have a nagging pain in your belly area.  You continue to feel sick to your stomach, you throw up, or you have watery poop (diarrhea).  You have a bad smelling fluid coming from your vagina.  You have pain when you pee (urinate).  You have increased puffiness (swelling) in your face, hands, legs, or ankles. Get help right away if:  You have a fever.  You are leaking fluid from your vagina.  You have spotting or bleeding from your vagina.  You have very bad belly cramping or pain.  You gain or lose weight rapidly.  You throw up blood. It may look like coffee grounds.  You are around people who have German measles, fifth disease, or chickenpox.  You have a very bad headache.  You have shortness of breath.  You have any kind of trauma, such as from a fall or a car accident. Summary  The first trimester of pregnancy is from week 1 until the end of week 13 (months 1 through 3).  To take care of yourself and your unborn baby, you will need to eat healthy meals, take medicines only if your doctor tells you to do so, and do activities that are safe for you and your baby.  Keep all follow-up visits as told by your doctor. This is important as your doctor will have to ensure that your baby is healthy and growing well. This information is not intended to replace advice given to you by your health care provider. Make sure you discuss any questions you have with your health care provider. Document Released: 10/24/2007 Document Revised: 05/15/2016 Document Reviewed: 05/15/2016 Elsevier Interactive Patient Education  2017 [employer]vier Inc.  

## 2018-05-06 NOTE — Progress Notes (Signed)
  Subjective:    [name] is a G2P0010 [redacted]w[redacted]d being seen today for her first obstetrical visit.  Her obstetrical history is significant for Recent Missed Abortion (6 mos ago), uterine fibroids with hx myomectomy. Patient does intend to breast feed. Pregnancy history fully reviewed.  Patient reports nausea.  Relieved to hear heartbeat after recent SAB  Vitals:   05/06/18 0933  BP: 111/79  Pulse: 87  Weight: 88.5 kg    HISTORY: OB History  Gravida Para Term Preterm AB Living  2 0 0 0 1 0  SAB TAB Ectopic Multiple Live Births  1 0 0 0 0    # Outcome Date GA Lbr Len/2nd Weight Sex Delivery Anes PTL Lv  2 Current           1 SAB            Past Medical History:  Diagnosis Date  . Endometriosis   . Ovarian cyst   . Uterine fibroid    Past Surgical History:  Procedure Laterality Date  . APPENDECTOMY  03/15/11  . ROBOT ASSISTED MYOMECTOMY N/A 12/11/2012   Procedure: ROBOTIC ASSISTED MYOMECTOMY;  Surgeon: Marvene Staff, MD;  Location: [facility];  Service: Gynecology;  Laterality: N/A;   Family History  Problem Relation Age of Onset  . Cancer Maternal Aunt        pt unaware of what kind  . Cancer Maternal Grandmother        pt unawaure of what kinf     Exam    Uterus:     Pelvic Exam:    Perineum: No Hemorrhoids   Vulva: Bartholin's, Urethra, Skene's normal   Vagina:  normal discharge   pH:    Cervix: no cervical motion tenderness   Adnexa: no mass, fullness, tenderness   Bony Pelvis: gynecoid  System: Breast:  normal appearance, no masses or tenderness   Skin: normal coloration and turgor, no rashes    Neurologic: oriented, grossly non-focal   Extremities: normal strength, tone, and muscle mass   HEENT neck supple with midline trachea   Mouth/Teeth mucous membranes moist, pharynx normal without lesions   Neck supple   Cardiovascular: regular rate and rhythm   Respiratory:  appears well, vitals normal, no respiratory distress, acyanotic, normal RR,  ear and throat exam is normal, neck free of mass or lymphadenopathy, chest clear, no wheezing, crepitations, rhonchi, normal symmetric air entry   Abdomen: soft, non-tender; bowel sounds normal; no masses,  no organomegaly   Urinary: urethral meatus normal      Assessment:    Pregnancy: G2P0010 Patient Active Problem List   Diagnosis Date Noted  . Supervision of other normal pregnancy, antepartum 05/06/2018  . Missed abortion 11/05/2017  . Fibroid 04/02/2016  . Endometriosis 04/02/2016        Plan:     Initial labs drawn. Prenatal vitamins. Problem list reviewed and updated. Genetic Screening discussed First Screen: Panorama.  Ultrasound discussed; fetal survey: requested.  Follow up in 4 weeks. 50% of 30 min visit spent on counseling and coordination of care.   Welcomed back to practice Routines reviewed.  Open to students Consulted Dr Ihor Dow re: hx Myomectomy.  Discussed it is possible that C/S might be recommended.  Will have her review op note from 2014 [place] next visit   Hansel Feinstein 05/06/2018

## 2018-05-06 NOTE — Progress Notes (Signed)
DATING AND VIABILITY SONOGRAM   [name] is a 31 y.o. year old G2P0010 with LMP Patient's last menstrual period was 02/26/2018 (exact date). which would correlate to  [redacted]w[redacted]d weeks gestation.  She has regular menstrual cycles.   She is here today for a confirmatory initial sonogram.    GESTATION: SINGLETON     FETAL ACTIVITY:          Heart rate         168          The fetus is active. Patient has multiple fibroids  ADNEXA: The ovaries are normal.   GESTATIONAL AGE AND  BIOMETRICS:  Gestational criteria: Estimated Date of Delivery: 12/03/18 by LMP now at [redacted]w[redacted]d  Previous Scans:0      CROWN RUMP LENGTH           3.05 cm          9-6 weeks                                                                               AVERAGE EGA(BY THIS SCAN):  9-6 weeks  WORKING EDD( LMP ):  12-03-2018     TECHNICIAN COMMENTS: Patient informed that the ultrasound is considered a limited obstetric ultrasound and is not intended to be a complete ultrasound exam. Patient also informed that the ultrasound is not being completed with the intent of assessing for fetal or placental anomalies or any pelvic abnormalities. Explained that the purpose of today's ultrasound is to assess for fetal heart rate. Patient acknowledges the purpose of the exam and the limitations of the study.     Kathrene Alu 05/06/2018 10:09 AM

## 2018-05-07 LAB — OBSTETRIC PANEL, INCLUDING HIV
ANTIBODY SCREEN: NEGATIVE
BASOS: 0 %
Basophils Absolute: 0 10*3/uL (ref 0.0–0.2)
EOS (ABSOLUTE): 0.2 10*3/uL (ref 0.0–0.4)
EOS: 2 %
HEMATOCRIT: 34.5 % (ref 34.0–46.6)
HEMOGLOBIN: 11.5 g/dL (ref 11.1–15.9)
HIV Screen 4th Generation wRfx: NONREACTIVE
Hepatitis B Surface Ag: NEGATIVE
IMMATURE GRANS (ABS): 0 10*3/uL (ref 0.0–0.1)
Immature Granulocytes: 0 %
LYMPHS: 19 %
Lymphocytes Absolute: 1.7 10*3/uL (ref 0.7–3.1)
MCH: 28.8 pg (ref 26.6–33.0)
MCHC: 33.3 g/dL (ref 31.5–35.7)
MCV: 87 fL (ref 79–97)
MONOS ABS: 1 10*3/uL — AB (ref 0.1–0.9)
Monocytes: 11 %
Neutrophils Absolute: 6.1 10*3/uL (ref 1.4–7.0)
Neutrophils: 68 %
Platelets: 257 10*3/uL (ref 150–450)
RBC: 3.99 x10E6/uL (ref 3.77–5.28)
RDW: 13.1 % (ref 12.3–15.4)
RH TYPE: POSITIVE
RPR Ser Ql: NONREACTIVE
Rubella Antibodies, IGG: 4.12 index (ref >0.99)
WBC: 9.1 10*3/uL (ref 3.4–10.8)

## 2018-05-09 LAB — CYTOLOGY - PAP
CHLAMYDIA, DNA PROBE: NEGATIVE
Diagnosis: NEGATIVE
HPV (WINDOPATH): DETECTED — AB
Neisseria Gonorrhea: NEGATIVE

## 2018-05-15 ENCOUNTER — Encounter: Payer: Self-pay | Admitting: Advanced Practice Midwife

## 2018-05-21 NOTE — L&D Delivery Note (Signed)
OB/GYN Faculty Practice Delivery Note  ADAH STONEBERG is a 32 y.o. G2P1011 s/p SVD at [redacted]w[redacted]d. She was admitted for induction of labor for preeclampsia without severe features.   ROM: 11h 48m with clear fluid GBS Status: positive - > 2 doses of PCN Maximum Maternal Temperature: Temp (72hrs), Avg:98.3 F (36.8 C), Min:97.7 F (36.5 C), Max:98.7 F (37.1 C)  Labor Progress: . Induction started with FB . Pitocin started  . Epidural placed . SROM, IUPC placement  Delivery Date/Time: 11/29/18 at 0148 Delivery: Called to room and patient was complete and pushing. Head delivered OA. No nuchal cord present. Shoulder and body delivered in usual fashion. Infant with spontaneous cry, placed on mother's abdomen, dried and stimulated. Cord clamped x 2 after 1-minute delay, and cut by father of baby. Cord blood drawn. Placenta delivered spontaneously with gentle cord traction. Fundus firm with massage and Pitocin. Labia, perineum, vagina, and cervix inspected inspected with 1st degree perineal with sulcal extension + periurethral..   Placenta: spontaneous, intact, 3-vessel cord (to be discarded) Complications: none immediate Lacerations: 1st degree perineal extending into sulcal (repaired with 3-0 Vicryl)  periurethral (repaired with 4-0 Monocryl) EBL: 1032cc per triton - mostly related to sulcal extension of perineal laceration, uterus with excellent ton Analgesia: epidural   Postpartum Planning [x]  message to sent to schedule follow-up  [x]  vaccines UTD  Infant: Vigorous female  APGARs 8, [place]. Juleen China, DO OB/GYN Fellow, Faculty Practice

## 2018-06-02 ENCOUNTER — Encounter: Payer: BLUE CROSS/BLUE SHIELD | Admitting: Obstetrics & Gynecology

## 2018-06-04 ENCOUNTER — Ambulatory Visit (INDEPENDENT_AMBULATORY_CARE_PROVIDER_SITE_OTHER): Payer: BLUE CROSS/BLUE SHIELD | Admitting: Obstetrics & Gynecology

## 2018-06-04 VITALS — BP 105/69 | HR 92 | Wt 193.1 lb

## 2018-06-04 DIAGNOSIS — O99019 Anemia complicating pregnancy, unspecified trimester: Secondary | ICD-10-CM | POA: Insufficient documentation

## 2018-06-04 DIAGNOSIS — Z3A14 14 weeks gestation of pregnancy: Secondary | ICD-10-CM

## 2018-06-04 DIAGNOSIS — Z3482 Encounter for supervision of other normal pregnancy, second trimester: Secondary | ICD-10-CM

## 2018-06-04 DIAGNOSIS — Z3480 Encounter for supervision of other normal pregnancy, unspecified trimester: Secondary | ICD-10-CM | POA: Diagnosis not present

## 2018-06-04 DIAGNOSIS — D219 Benign neoplasm of connective and other soft tissue, unspecified: Secondary | ICD-10-CM

## 2018-06-04 NOTE — Progress Notes (Signed)
   PRENATAL VISIT NOTE  Subjective:  [name] is a 33 y.o. G2P0010 at [redacted]w[redacted]d being seen today for ongoing prenatal care.  She is currently monitored for the following issues for this low-risk pregnancy and has Fibroid; Endometriosis; Missed abortion; Supervision of other normal pregnancy, antepartum; and Anemia of mother in pregnancy, antepartum on their problem list.  Patient reports no complaints. Her nausea has resolved.  She recently got married. Contractions: Not present. Vag. Bleeding: None.  Movement: Absent. Denies leaking of fluid.   The following portions of the patient's history were reviewed and updated as appropriate: allergies, current medications, past family history, past medical history, past social history, past surgical history and problem list. Problem list updated.  Objective:   Vitals:   06/04/18 1326  BP: 105/69  Pulse: 92  Weight: 193 lb 1.3 oz (87.6 kg)    Fetal Status:     Movement: Absent     General:  Alert, oriented and cooperative. Patient is in no acute distress.  Skin: Skin is warm and dry. No rash noted.   Cardiovascular: Normal heart rate noted  Respiratory: Normal respiratory effort, no problems with respiration noted  Abdomen: Soft, gravid, appropriate for gestational age.  Pain/Pressure: Absent     Pelvic: Cervical exam deferred        Extremities: Normal range of motion.  Edema: None  Mental Status: Normal mood and affect. Normal behavior. Normal judgment and thought content.   Assessment and Plan:  Pregnancy: G2P0010 at [redacted]w[redacted]d  1. Supervision of other normal pregnancy, antepartum Anatomy scan at 18-20 weeks NIPT today  2. Fibroid  3. Anemia ante  Begin FeSO4  Preterm labor symptoms and general obstetric precautions including but not limited to vaginal bleeding, contractions, leaking of fluid and fetal movement were reviewed in detail with the patient. Please refer to After Visit Summary for other counseling recommendations.    Return in about 4 weeks (around 07/02/2018).  Future Appointments  Date Time Provider [place]  07/09/2018 10:45 AM [facility] [place] 2 WH-MFCUS [facility]-US  07/09/2018  1:15 PM Lavonia Drafts, MD [facility] None    Lavonia Drafts, MD

## 2018-06-04 NOTE — Patient Instructions (Signed)

## 2018-06-05 LAB — HEMOGLOBIN A1C
Est. average glucose Bld gHb Est-mCnc: 108 mg/dL
HEMOGLOBIN A1C: 5.4 % (ref 4.8–5.6)

## 2018-06-16 DIAGNOSIS — Z3480 Encounter for supervision of other normal pregnancy, unspecified trimester: Secondary | ICD-10-CM

## 2018-06-16 LAB — SMN1 COPY NUMBER ANALYSIS (SMA CARRIER SCREENING)

## 2018-07-02 ENCOUNTER — Ambulatory Visit: Payer: BLUE CROSS/BLUE SHIELD | Admitting: Obstetrics & Gynecology

## 2018-07-02 ENCOUNTER — Encounter (HOSPITAL_COMMUNITY): Payer: Self-pay

## 2018-07-08 ENCOUNTER — Encounter: Payer: Self-pay | Admitting: Advanced Practice Midwife

## 2018-07-08 ENCOUNTER — Ambulatory Visit (INDEPENDENT_AMBULATORY_CARE_PROVIDER_SITE_OTHER): Payer: BLUE CROSS/BLUE SHIELD | Admitting: Advanced Practice Midwife

## 2018-07-08 VITALS — BP 109/71 | HR 91 | Wt 193.0 lb

## 2018-07-08 DIAGNOSIS — Z3480 Encounter for supervision of other normal pregnancy, unspecified trimester: Secondary | ICD-10-CM

## 2018-07-08 DIAGNOSIS — Z3A18 18 weeks gestation of pregnancy: Secondary | ICD-10-CM

## 2018-07-08 DIAGNOSIS — Z3482 Encounter for supervision of other normal pregnancy, second trimester: Secondary | ICD-10-CM

## 2018-07-08 NOTE — Progress Notes (Signed)
   PRENATAL VISIT NOTE  Subjective:  [name] is a 33 y.o. G2P0010 at [redacted]w[redacted]d being seen today for ongoing prenatal care.  She is currently monitored for the following issues for this low-risk pregnancy and has Fibroid; Endometriosis; Missed abortion; Supervision of other normal pregnancy, antepartum; and Anemia of mother in pregnancy, antepartum on their problem list.  Patient reports no complaints.  Contractions: Not present. Vag. Bleeding: None.  Movement: Absent. Denies leaking of fluid.   The following portions of the patient's history were reviewed and updated as appropriate: allergies, current medications, past family history, past medical history, past social history, past surgical history and problem list. Problem list updated.  Objective:   Vitals:   07/08/18 0902  BP: 109/71  Pulse: 91  Weight: 87.5 kg    Fetal Status: Fetal Heart Rate (bpm): 140 Fundal Height: 19 cm Movement: Absent     General:  Alert, oriented and cooperative. Patient is in no acute distress.  Skin: Skin is warm and dry. No rash noted.   Cardiovascular: Normal heart rate noted  Respiratory: Normal respiratory effort, no problems with respiration noted  Abdomen: Soft, gravid, appropriate for gestational age.  Pain/Pressure: Present     Pelvic: Cervical exam deferred        Extremities: Normal range of motion.  Edema: None  Mental Status: Normal mood and affect. Normal behavior. Normal judgment and thought content.   Assessment and Plan:  Pregnancy: G2P0010 at [redacted]w[redacted]d  1. Supervision of other normal pregnancy, antepartum      Has [place] scheduled for tomorrow - AFP, Serum, Open Spina Bifida  Preterm labor symptoms and general obstetric precautions including but not limited to vaginal bleeding, contractions, leaking of fluid and fetal movement were reviewed in detail with the patient. Please refer to After Visit Summary for other counseling recommendations.  Return in about 4 weeks (around 08/05/2018)  for [facility].  Future Appointments  Date Time Provider [place]  07/09/2018 10:45 AM [facility]-MFC [place] 2 WH-MFCUS MFC-US  08/06/2018  3:15 PM Lavonia Drafts, MD [facility] None    Hansel Feinstein, CNM

## 2018-07-08 NOTE — Patient Instructions (Signed)

## 2018-07-09 ENCOUNTER — Other Ambulatory Visit (HOSPITAL_COMMUNITY): Payer: Self-pay | Admitting: *Deleted

## 2018-07-09 ENCOUNTER — Ambulatory Visit (HOSPITAL_COMMUNITY)
Admission: RE | Admit: 2018-07-09 | Discharge: 2018-07-09 | Disposition: A | Discharge status: Home / Self Care | Payer: BLUE CROSS/BLUE SHIELD | Source: Ambulatory Visit | Attending: Obstetrics & Gynecology | Admitting: Obstetrics & Gynecology

## 2018-07-09 ENCOUNTER — Encounter: Payer: BLUE CROSS/BLUE SHIELD | Admitting: Obstetrics & Gynecology

## 2018-07-09 ENCOUNTER — Other Ambulatory Visit (HOSPITAL_COMMUNITY): Payer: BLUE CROSS/BLUE SHIELD

## 2018-07-09 DIAGNOSIS — Z363 Encounter for antenatal screening for malformations: Secondary | ICD-10-CM

## 2018-07-09 DIAGNOSIS — O3410 Maternal care for benign tumor of corpus uteri, unspecified trimester: Principal | ICD-10-CM

## 2018-07-09 DIAGNOSIS — O3412 Maternal care for benign tumor of corpus uteri, second trimester: Secondary | ICD-10-CM

## 2018-07-09 DIAGNOSIS — Z3A19 19 weeks gestation of pregnancy: Secondary | ICD-10-CM | POA: Diagnosis not present

## 2018-07-09 DIAGNOSIS — Z3480 Encounter for supervision of other normal pregnancy, unspecified trimester: Secondary | ICD-10-CM | POA: Diagnosis not present

## 2018-07-09 DIAGNOSIS — D259 Leiomyoma of uterus, unspecified: Secondary | ICD-10-CM

## 2018-07-10 LAB — AFP, SERUM, OPEN SPINA BIFIDA
AFP MOM: 1.03
AFP VALUE AFPOSL: 41.8 ng/mL
Gest. Age on Collection Date: 18 weeks
MATERNAL AGE AT EDD: 30.8 a
OSBR Risk 1 IN: 10000
Test Results:: NEGATIVE
Weight: 193 [lb_av]

## 2018-08-05 ENCOUNTER — Other Ambulatory Visit: Payer: Self-pay

## 2018-08-05 ENCOUNTER — Ambulatory Visit (HOSPITAL_COMMUNITY)
Admission: RE | Admit: 2018-08-05 | Discharge: 2018-08-05 | Disposition: A | Discharge status: Home / Self Care | Payer: BLUE CROSS/BLUE SHIELD | Source: Ambulatory Visit | Attending: Maternal & Fetal Medicine | Admitting: Maternal & Fetal Medicine

## 2018-08-05 DIAGNOSIS — O3410 Maternal care for benign tumor of corpus uteri, unspecified trimester: Secondary | ICD-10-CM | POA: Insufficient documentation

## 2018-08-05 DIAGNOSIS — Z3A22 22 weeks gestation of pregnancy: Secondary | ICD-10-CM

## 2018-08-05 DIAGNOSIS — D259 Leiomyoma of uterus, unspecified: Secondary | ICD-10-CM

## 2018-08-05 DIAGNOSIS — O3412 Maternal care for benign tumor of corpus uteri, second trimester: Secondary | ICD-10-CM | POA: Diagnosis not present

## 2018-08-05 DIAGNOSIS — Z362 Encounter for other antenatal screening follow-up: Secondary | ICD-10-CM

## 2018-08-06 ENCOUNTER — Encounter: Payer: BLUE CROSS/BLUE SHIELD | Admitting: Obstetrics & Gynecology

## 2018-08-20 ENCOUNTER — Ambulatory Visit (INDEPENDENT_AMBULATORY_CARE_PROVIDER_SITE_OTHER): Payer: BLUE CROSS/BLUE SHIELD | Admitting: Family Medicine

## 2018-08-20 ENCOUNTER — Other Ambulatory Visit: Payer: Self-pay

## 2018-08-20 VITALS — BP 117/68 | HR 99 | Wt 195.0 lb

## 2018-08-20 DIAGNOSIS — O3412 Maternal care for benign tumor of corpus uteri, second trimester: Secondary | ICD-10-CM

## 2018-08-20 DIAGNOSIS — D219 Benign neoplasm of connective and other soft tissue, unspecified: Secondary | ICD-10-CM

## 2018-08-20 DIAGNOSIS — Z3A25 25 weeks gestation of pregnancy: Secondary | ICD-10-CM

## 2018-08-20 DIAGNOSIS — Z3480 Encounter for supervision of other normal pregnancy, unspecified trimester: Secondary | ICD-10-CM

## 2018-08-20 NOTE — Progress Notes (Signed)
   PRENATAL VISIT NOTE  Subjective:  [name] is a 33 y.o. G2P0010 at [redacted]w[redacted]d being seen today for ongoing prenatal care.  She is currently monitored for the following issues for this low-risk pregnancy and has Fibroid; Endometriosis; Missed abortion; Supervision of other normal pregnancy, antepartum; and Anemia of mother in pregnancy, antepartum on their problem list.  Patient reports no complaints.  Contractions: Not present.  .  Movement: Present. Denies leaking of fluid.   The following portions of the patient's history were reviewed and updated as appropriate: allergies, current medications, past family history, past medical history, past social history, past surgical history and problem list.   Objective:   Vitals:   08/20/18 1300  BP: 117/68  Pulse: 99  Weight: 195 lb (88.5 kg)    Fetal Status: Fetal Heart Rate (bpm): 141 Fundal Height: 25 cm Movement: Present     General:  Alert, oriented and cooperative. Patient is in no acute distress.  Skin: Skin is warm and dry. No rash noted.   Cardiovascular: Normal heart rate noted  Respiratory: Normal respiratory effort, no problems with respiration noted  Abdomen: Soft, gravid, appropriate for gestational age.  Pain/Pressure: Present     Pelvic: Cervical exam deferred        Extremities: Normal range of motion.  Edema: None  Mental Status: Normal mood and affect. Normal behavior. Normal judgment and thought content.   Assessment and Plan:  Pregnancy: G2P0010 at [redacted]w[redacted]d 1. Supervision of other normal pregnancy, antepartum FHT and FH normal - Babyscripts Schedule Optimization  2. Fibroid Rpt [place] scheduled  Preterm labor symptoms and general obstetric precautions including but not limited to vaginal bleeding, contractions, leaking of fluid and fetal movement were reviewed in detail with the patient. Please refer to After Visit Summary for other counseling recommendations.   Return in about 3 weeks (around 09/10/2018) for OB  f/u, 2 hr GTT.  Future Appointments  Date Time Provider [place]  09/11/2018  8:30 AM Truett Mainland, DO [facility] None  09/30/2018  1:45 PM [place] 2 WH-MFCUS [facility]-US    Truett Mainland, DO

## 2018-08-25 ENCOUNTER — Encounter: Payer: BLUE CROSS/BLUE SHIELD | Admitting: Obstetrics & Gynecology

## 2018-09-09 ENCOUNTER — Ambulatory Visit (HOSPITAL_COMMUNITY): Payer: BLUE CROSS/BLUE SHIELD

## 2018-09-09 ENCOUNTER — Other Ambulatory Visit: Payer: Self-pay

## 2018-09-09 ENCOUNTER — Ambulatory Visit (INDEPENDENT_AMBULATORY_CARE_PROVIDER_SITE_OTHER): Payer: BLUE CROSS/BLUE SHIELD

## 2018-09-09 VITALS — BP 104/65 | HR 92 | Ht 63.0 in | Wt 197.1 lb

## 2018-09-09 DIAGNOSIS — B9689 Other specified bacterial agents as the cause of diseases classified elsewhere: Secondary | ICD-10-CM

## 2018-09-09 DIAGNOSIS — N898 Other specified noninflammatory disorders of vagina: Secondary | ICD-10-CM

## 2018-09-09 DIAGNOSIS — N760 Acute vaginitis: Secondary | ICD-10-CM

## 2018-09-09 DIAGNOSIS — B373 Candidiasis of vulva and vagina: Secondary | ICD-10-CM

## 2018-09-09 NOTE — Progress Notes (Addendum)
Pt presents to the office c/o of itching with  green, tan,and clumpy discharge x 3 days. Pt self swabbed and wet prep was sent to the lab. Jonatha Gagen l Damya Comley, CMA   Attestation of Attending Supervision of RN: Evaluation and management procedures were performed by the nurse under my supervision and collaboration.  I have reviewed the nursing note and chart, and I agree with the management and plan.  Carolyn L. Harraway-Smith, M.D., Cherlynn June

## 2018-09-11 ENCOUNTER — Other Ambulatory Visit: Payer: Self-pay

## 2018-09-11 ENCOUNTER — Ambulatory Visit (INDEPENDENT_AMBULATORY_CARE_PROVIDER_SITE_OTHER): Payer: BLUE CROSS/BLUE SHIELD | Admitting: Family Medicine

## 2018-09-11 VITALS — BP 108/73 | HR 93 | Wt 193.1 lb

## 2018-09-11 DIAGNOSIS — O3413 Maternal care for benign tumor of corpus uteri, third trimester: Secondary | ICD-10-CM

## 2018-09-11 DIAGNOSIS — B9689 Other specified bacterial agents as the cause of diseases classified elsewhere: Secondary | ICD-10-CM

## 2018-09-11 DIAGNOSIS — Z3480 Encounter for supervision of other normal pregnancy, unspecified trimester: Secondary | ICD-10-CM

## 2018-09-11 DIAGNOSIS — B379 Candidiasis, unspecified: Secondary | ICD-10-CM

## 2018-09-11 DIAGNOSIS — Z3A28 28 weeks gestation of pregnancy: Secondary | ICD-10-CM

## 2018-09-11 DIAGNOSIS — D259 Leiomyoma of uterus, unspecified: Secondary | ICD-10-CM

## 2018-09-11 DIAGNOSIS — N760 Acute vaginitis: Principal | ICD-10-CM

## 2018-09-11 LAB — CERVICOVAGINAL ANCILLARY ONLY
Bacterial vaginitis: POSITIVE — AB
Candida vaginitis: POSITIVE — AB

## 2018-09-11 MED ORDER — METRONIDAZOLE 500 MG PO TABS
500.0000 mg | ORAL_TABLET | Freq: Two times a day (BID) | ORAL | 0 refills | Status: DC
Start: 2018-09-11 — End: 2018-11-25

## 2018-09-11 MED ORDER — TERCONAZOLE 0.8 % VA CREA: 1.0000 | TOPICAL_CREAM | Freq: Every day | VAGINAL | 0 refills | Status: DC

## 2018-09-11 NOTE — Progress Notes (Signed)
Pt sent MyChart message requesting wet prep  Results. Pt made aware that she has BV and yeast. Medication was sent to the pharmacy.  chiquita l wilson, CMA

## 2018-09-11 NOTE — Progress Notes (Signed)
   PRENATAL VISIT NOTE  Subjective:  [name] is a 33 y.o. G2P0010 at [redacted]w[redacted]d being seen today for ongoing prenatal care.  She is currently monitored for the following issues for this low-risk pregnancy and has Fibroid; Endometriosis; Missed abortion; Supervision of other normal pregnancy, antepartum; and Anemia of mother in pregnancy, antepartum on their problem list.  Patient reports vaginal irritation. Had self swab, but hasn't returned.  Contractions: Not present. Vag. Bleeding: None.  Movement: Present. Denies leaking of fluid.   The following portions of the patient's history were reviewed and updated as appropriate: allergies, current medications, past family history, past medical history, past social history, past surgical history and problem list.   Objective:   Vitals:   09/11/18 0848  BP: 108/73  Pulse: 93  Weight: 193 lb 1.3 oz (87.6 kg)    Fetal Status: Fetal Heart Rate (bpm): 165   Movement: Present     General:  Alert, oriented and cooperative. Patient is in no acute distress.  Skin: Skin is warm and dry. No rash noted.   Cardiovascular: Normal heart rate noted  Respiratory: Normal respiratory effort, no problems with respiration noted  Abdomen: Soft, gravid, appropriate for gestational age.  Pain/Pressure: Absent     Pelvic: Cervical exam deferred        Extremities: Normal range of motion.  Edema: None  Mental Status: Normal mood and affect. Normal behavior. Normal judgment and thought content.   Assessment and Plan:  Pregnancy: G2P0010 at [redacted]w[redacted]d 1. Supervision of other normal pregnancy, antepartum FHT and Fh normal. Will follow vaginal cultures - CBC - Glucose Tolerance, 2 Hours w/1 Hour - HIV Antibody (routine testing w rflx) - RPR  2. Uterine leiomyoma, unspecified location [place] on 5/12.  Preterm labor symptoms and general obstetric precautions including but not limited to vaginal bleeding, contractions, leaking of fluid and fetal movement were reviewed  in detail with the patient. Please refer to After Visit Summary for other counseling recommendations.   Return in about 4 weeks (around 10/09/2018) for OB f/u, virtual.  Future Appointments  Date Time Provider [place]  09/30/2018  1:45 PM [facility] [place] 2 [facility]     J , DO

## 2018-09-12 LAB — CBC
Hematocrit: 32.9 % — ABNORMAL LOW (ref 34.0–46.6)
Hemoglobin: 10.8 g/dL — ABNORMAL LOW (ref 11.1–15.9)
MCH: 29 pg (ref 26.6–33.0)
MCHC: 32.8 g/dL (ref 31.5–35.7)
MCV: 88 fL (ref 79–97)
Platelets: 246 10*3/uL (ref 150–450)
RBC: 3.73 x10E6/uL — ABNORMAL LOW (ref 3.77–5.28)
RDW: 12.6 % (ref 11.7–15.4)
WBC: 14.7 10*3/uL — ABNORMAL HIGH (ref 3.4–10.8)

## 2018-09-12 LAB — RPR: RPR Ser Ql: NONREACTIVE

## 2018-09-12 LAB — GLUCOSE TOLERANCE, 2 HOURS W/ 1HR
Glucose, 1 hour: 130 mg/dL (ref 65–179)
Glucose, 2 hour: 88 mg/dL (ref 65–152)
Glucose, Fasting: 73 mg/dL (ref 65–91)

## 2018-09-12 LAB — HIV ANTIBODY (ROUTINE TESTING W REFLEX): HIV Screen 4th Generation wRfx: NONREACTIVE

## 2018-09-15 ENCOUNTER — Other Ambulatory Visit: Payer: Self-pay | Admitting: Obstetrics & Gynecology

## 2018-09-30 ENCOUNTER — Other Ambulatory Visit: Payer: Self-pay

## 2018-09-30 ENCOUNTER — Ambulatory Visit (HOSPITAL_COMMUNITY)
Admission: RE | Admit: 2018-09-30 | Discharge: 2018-09-30 | Disposition: A | Discharge status: Home / Self Care | Payer: 59 | Source: Ambulatory Visit | Attending: Obstetrics and Gynecology | Admitting: Obstetrics and Gynecology

## 2018-09-30 DIAGNOSIS — O3410 Maternal care for benign tumor of corpus uteri, unspecified trimester: Secondary | ICD-10-CM | POA: Insufficient documentation

## 2018-09-30 DIAGNOSIS — Z3A30 30 weeks gestation of pregnancy: Secondary | ICD-10-CM

## 2018-09-30 DIAGNOSIS — Z362 Encounter for other antenatal screening follow-up: Secondary | ICD-10-CM

## 2018-09-30 DIAGNOSIS — O3413 Maternal care for benign tumor of corpus uteri, third trimester: Secondary | ICD-10-CM | POA: Diagnosis not present

## 2018-09-30 DIAGNOSIS — D259 Leiomyoma of uterus, unspecified: Secondary | ICD-10-CM | POA: Diagnosis present

## 2018-10-08 ENCOUNTER — Telehealth: Payer: Self-pay

## 2018-10-08 NOTE — Telephone Encounter (Signed)
Patient called stating that she just noticed some blood tinged discharge. Patient is [redacted] weeks pregnant and also states she has had an increase in cramping. Reports baby moving well.  Patient is scheduled for virtual visit tomorrow but would like to swtich to in office due to the new discharge.  Patient advised that if she sees more blood, a big gush of fluid, or decrease fetal movement she needs to go immediately to [facility] to be evaluated.Patient states understanding. Kathrene Alu RN

## 2018-10-09 ENCOUNTER — Encounter: Payer: BLUE CROSS/BLUE SHIELD | Admitting: Obstetrics & Gynecology

## 2018-10-09 ENCOUNTER — Ambulatory Visit (INDEPENDENT_AMBULATORY_CARE_PROVIDER_SITE_OTHER): Payer: 59 | Admitting: Obstetrics & Gynecology

## 2018-10-09 ENCOUNTER — Other Ambulatory Visit: Payer: Self-pay

## 2018-10-09 VITALS — BP 108/70 | HR 90 | Wt 198.0 lb

## 2018-10-09 DIAGNOSIS — Z3480 Encounter for supervision of other normal pregnancy, unspecified trimester: Secondary | ICD-10-CM

## 2018-10-09 DIAGNOSIS — O99019 Anemia complicating pregnancy, unspecified trimester: Secondary | ICD-10-CM

## 2018-10-09 DIAGNOSIS — N898 Other specified noninflammatory disorders of vagina: Secondary | ICD-10-CM | POA: Diagnosis not present

## 2018-10-09 DIAGNOSIS — B9689 Other specified bacterial agents as the cause of diseases classified elsewhere: Secondary | ICD-10-CM | POA: Diagnosis not present

## 2018-10-09 DIAGNOSIS — Z3A32 32 weeks gestation of pregnancy: Secondary | ICD-10-CM

## 2018-10-09 DIAGNOSIS — Z23 Encounter for immunization: Secondary | ICD-10-CM | POA: Diagnosis not present

## 2018-10-09 DIAGNOSIS — B3731 Acute candidiasis of vulva and vagina: Secondary | ICD-10-CM

## 2018-10-09 DIAGNOSIS — B373 Candidiasis of vulva and vagina: Secondary | ICD-10-CM | POA: Diagnosis not present

## 2018-10-09 DIAGNOSIS — N760 Acute vaginitis: Secondary | ICD-10-CM

## 2018-10-09 DIAGNOSIS — O99013 Anemia complicating pregnancy, third trimester: Secondary | ICD-10-CM

## 2018-10-09 DIAGNOSIS — Z3483 Encounter for supervision of other normal pregnancy, third trimester: Secondary | ICD-10-CM

## 2018-10-09 HISTORY — DX: Acute candidiasis of vulva and vagina: B37.31

## 2018-10-09 MED ORDER — TERCONAZOLE 0.4 % VA CREA: 1.0000 | TOPICAL_CREAM | Freq: Every day | VAGINAL | 0 refills | Status: DC

## 2018-10-09 MED ORDER — FLUCONAZOLE 150 MG PO TABS: 150.0000 mg | ORAL_TABLET | Freq: Once | ORAL | 0 refills | Status: AC

## 2018-10-09 NOTE — Progress Notes (Signed)
   PRENATAL VISIT NOTE  Subjective:  [name] is a 33 y.o. G2P0010 at [redacted]w[redacted]d being seen today for ongoing prenatal care.  She is currently monitored for the following issues for this low-risk pregnancy and has Fibroid; Endometriosis; Supervision of other normal pregnancy, antepartum; Anemia of mother in pregnancy, antepartum; and Yeast vaginitis on their problem list.  Patient reports thinks she passed her mucous plug. she was last sexually active 4-5 days ago.  Contractions: Irritability. Vag. Bleeding: None.  Movement: Present. Denies leaking of fluid.   The following portions of the patient's history were reviewed and updated as appropriate: allergies, current medications, past family history, past medical history, past social history, past surgical history and problem list.   Objective:   Vitals:   10/09/18 1321  BP: 108/70  Pulse: 90  Weight: 198 lb (89.8 kg)    Fetal Status: Fetal Heart Rate (bpm): 138   Movement: Present     General:  Alert, oriented and cooperative. Patient is in no acute distress.  Skin: Skin is warm and dry. No rash noted.   Cardiovascular: Normal heart rate noted  Respiratory: Normal respiratory effort, no problems with respiration noted  Abdomen: Soft, gravid, appropriate for gestational age.  Pain/Pressure: Present     Pelvic: Cervical exam performed      the cervix was friable   Extremities: Normal range of motion.  Edema: Trace  Mental Status: Normal mood and affect. Normal behavior. Normal judgment and thought content.   Assessment and Plan:  Pregnancy: G2P0010 at [redacted]w[redacted]d 1. Supervision of other normal pregnancy, antepartum  - Tdap vaccine greater than or equal to 7yo IM - Cervicovaginal ancillary only( [place])  2. Yeast infection of the vagina  - terconazole (TERAZOL 7) 0.4 % vaginal cream; Place 1 applicator vaginally at bedtime.  Dispense: 45 g; Refill: 0 - fluconazole (DIFLUCAN) 150 MG tablet; Take 1 tablet (150 mg total) by mouth  once for 1 dose.  Dispense: 1 tablet; Refill: 0 - Cervicovaginal ancillary only( )  3. Vaginal discharge  4. Anemia of mother in pregnancy, antepartum FeSO4 daily  Preterm labor symptoms and general obstetric precautions including but not limited to vaginal bleeding, contractions, leaking of fluid and fetal movement were reviewed in detail with the patient. Please refer to After Visit Summary for other counseling recommendations.   Return in about 2 weeks (around 10/23/2018).  Future Appointments  Date Time Provider [place]  10/09/2018  2:30 PM Lavonia Drafts, MD [facility] None  10/23/2018  3:00 PM Truett Mainland, DO [facility] None    Lavonia Drafts, MD

## 2018-10-10 LAB — CERVICOVAGINAL ANCILLARY ONLY
Bacterial vaginitis: POSITIVE — AB
Candida vaginitis: POSITIVE — AB

## 2018-10-10 MED ORDER — FLUCONAZOLE 150 MG PO TABS: 150.0000 mg | ORAL_TABLET | Freq: Once | ORAL | 0 refills | Status: AC

## 2018-10-10 MED ORDER — METRONIDAZOLE 500 MG PO TABS: 500.0000 mg | ORAL_TABLET | Freq: Two times a day (BID) | ORAL | 0 refills | Status: DC

## 2018-10-10 NOTE — Addendum Note (Signed)
Addended by: Lavonia Drafts on: 10/10/2018 05:32 PM   Modules accepted: Orders

## 2018-10-15 ENCOUNTER — Telehealth: Payer: Self-pay

## 2018-10-15 NOTE — Telephone Encounter (Signed)
Patient called and made aware that she did have yeast and BV> patient states she did get a call from pharmacy that she had a new script for flagyl. Instructed to take it for seven days. Patient will follow up if symptoms are better at next weeks appt. Kathrene Alu RN

## 2018-10-17 ENCOUNTER — Other Ambulatory Visit: Payer: Self-pay

## 2018-10-17 ENCOUNTER — Encounter (HOSPITAL_COMMUNITY): Payer: Self-pay

## 2018-10-17 ENCOUNTER — Inpatient Hospital Stay (HOSPITAL_COMMUNITY)
Admission: AD | Admit: 2018-10-17 | Discharge: 2018-10-17 | Disposition: A | Discharge status: Home / Self Care | Payer: 59 | Attending: Obstetrics and Gynecology | Admitting: Obstetrics and Gynecology

## 2018-10-17 DIAGNOSIS — Z3A33 33 weeks gestation of pregnancy: Secondary | ICD-10-CM | POA: Diagnosis not present

## 2018-10-17 DIAGNOSIS — R102 Pelvic and perineal pain: Secondary | ICD-10-CM | POA: Diagnosis not present

## 2018-10-17 DIAGNOSIS — Z3689 Encounter for other specified antenatal screening: Secondary | ICD-10-CM | POA: Insufficient documentation

## 2018-10-17 DIAGNOSIS — O26899 Other specified pregnancy related conditions, unspecified trimester: Secondary | ICD-10-CM

## 2018-10-17 DIAGNOSIS — O26893 Other specified pregnancy related conditions, third trimester: Secondary | ICD-10-CM

## 2018-10-17 DIAGNOSIS — O9989 Other specified diseases and conditions complicating pregnancy, childbirth and the puerperium: Secondary | ICD-10-CM | POA: Diagnosis not present

## 2018-10-17 DIAGNOSIS — Z79899 Other long term (current) drug therapy: Secondary | ICD-10-CM | POA: Insufficient documentation

## 2018-10-17 DIAGNOSIS — Z809 Family history of malignant neoplasm, unspecified: Secondary | ICD-10-CM | POA: Diagnosis not present

## 2018-10-17 LAB — URINALYSIS, ROUTINE W REFLEX MICROSCOPIC
Bacteria, UA: NONE SEEN
Bilirubin Urine: NEGATIVE
Glucose, UA: NEGATIVE mg/dL
Hgb urine dipstick: NEGATIVE
Ketones, ur: NEGATIVE mg/dL
Nitrite: NEGATIVE
Protein, ur: NEGATIVE mg/dL
Specific Gravity, Urine: 1.003 — ABNORMAL LOW (ref 1.005–1.030)
pH: 7 (ref 5.0–8.0)

## 2018-10-17 MED ORDER — COMFORT FIT MATERNITY SUPP SM MISC: 1.0000 [IU] | Freq: Every day | 0 refills | Status: DC | PRN

## 2018-10-17 NOTE — MAU Provider Note (Signed)
History     CSN: [id number]  Arrival date and time: 10/17/18 1221   First Provider Initiated Contact with Patient 10/17/18 1334      Chief Complaint  Patient presents with  . Abdominal Pain   [name] is a 33 y.o. G2P0010 at [redacted]w[redacted]d who presents to MAU for intermittent, right-sided abdominal pain. Pt denies anything in the vagina in the last 24hrs. Pt reports onset of pain at 0830/0900 this morning, pt also reports an "incident" with the same pain on 10/09/2018 when she had her OB appt, which resolved spontaneously. Pt reports she discussed this with her provider, had her cervix checked and cultures performed. Pt reports she was given an RX for yeast and RX for metronidazole. Pt started metronidazole yesterday and is waiting until she finishes the metronidazole to take her Diflucan pill.  Location: RLQ (pt does not have appendix any longer) Duration: <24hrs, not present at this time Character: intermittent, sharp and stabbing, radiates down in to the vagina "from time to time" Aggravating/Associated: movement/none Relieving: lying still Treatment: Tylenol - did not help Severity: 9/10 when present  Pt denies VB, LOF, ctx, decreased FM, vaginal discharge/odor/itching. Pt denies N/V, constipation, diarrhea, or urinary problems. Pt denies fever, chills, fatigue, sweating or changes in appetite. Pt denies SOB or chest pain. Pt denies dizziness, HA, light-headedness, weakness.  Problems this pregnancy include: none. Allergies? NKDA Prenatal care provider? [facility] [place], next appt 10/23/2018   OB History    Gravida  2   Para  0   Term  0   Preterm  0   AB  1   Living  0     SAB  1   TAB  0   Ectopic  0   Multiple  0   Live Births  0           Past Medical History:  Diagnosis Date  . Endometriosis   . Ovarian cyst   . Uterine fibroid     Past Surgical History:  Procedure Laterality Date  . APPENDECTOMY  03/15/11  . ROBOT ASSISTED MYOMECTOMY  N/A 12/11/2012   Procedure: ROBOTIC ASSISTED MYOMECTOMY;  Surgeon: Marvene Staff, MD;  Location: [facility];  Service: Gynecology;  Laterality: N/A;    Family History  Problem Relation Age of Onset  . Cancer Maternal Aunt        pt unaware of what kind  . Cancer Maternal Grandmother        pt unawaure of what kinf    Social History   Tobacco Use  . Smoking status: Never Smoker  . Smokeless tobacco: Never Used  Substance Use Topics  . Alcohol use: No  . Drug use: No    Allergies: No Known Allergies  Medications Prior to Admission  Medication Sig Dispense Refill Last Dose  . acetaminophen (TYLENOL) 160 MG/5ML elixir Take 15 mg/kg by mouth every 4 (four) hours as needed for fever.   Taking  . metroNIDAZOLE (FLAGYL) 500 MG tablet Take 1 tablet (500 mg total) by mouth 2 (two) times daily. (Patient not taking: Reported on 10/09/2018) 14 tablet 0 Not Taking  . metroNIDAZOLE (FLAGYL) 500 MG tablet Take 1 tablet (500 mg total) by mouth 2 (two) times daily. 14 tablet 0   . Prenatal Vit-Fe Fumarate-FA (PRENATAL MULTIVITAMIN) TABS tablet Take 1 tablet by mouth daily at 12 noon.   Taking  . terconazole (TERAZOL 3) 0.8 % vaginal cream Place 1 applicator vaginally at bedtime. (Patient not  taking: Reported on 10/09/2018) 20 g 0 Not Taking  . terconazole (TERAZOL 7) 0.4 % vaginal cream Place 1 applicator vaginally at bedtime. 45 g 0     Review of Systems  Constitutional: Negative for chills, diaphoresis, fatigue and fever.  Respiratory: Negative for shortness of breath.   Cardiovascular: Negative for chest pain.  Gastrointestinal: Positive for abdominal pain. Negative for constipation, diarrhea, nausea and vomiting.  Genitourinary: Negative for dysuria, flank pain, frequency, pelvic pain, urgency, vaginal bleeding and vaginal discharge.  Neurological: Negative for dizziness, weakness, light-headedness and headaches.   Physical Exam   Blood pressure 120/77, pulse 84, temperature 98.2 F  (36.8 C), temperature source Oral, resp. rate 16, height 5\' 3"  (1.6 m), weight 91.9 kg, last menstrual period 02/26/2018, SpO2 100 %, unknown if currently breastfeeding.  Patient Vitals for the past 24 hrs:  BP Temp Temp src Pulse Resp SpO2 Height Weight  10/17/18 1435 - - - - 16 - - -  10/17/18 1246 120/77 98.2 F (36.8 C) Oral 84 18 100 % 5\' 3"  (1.6 m) 91.9 kg   Physical Exam  Constitutional: She is oriented to person, place, and time. She appears well-developed and well-nourished. No distress.  HENT:  Head: Normocephalic and atraumatic.  Respiratory: Effort normal.  GI: Soft. She exhibits no distension and no mass. There is no abdominal tenderness. There is no rebound and no guarding.  Genitourinary: There is no rash, tenderness or lesion on the right labia. There is no rash, tenderness or lesion on the left labia.    Genitourinary Comments: -CE: long/closed/thick, unchanged from exam on 10/09/2018   Neurological: She is alert and oriented to person, place, and time.  Skin: Skin is warm and dry. She is not diaphoretic.  Psychiatric: She has a normal mood and affect. Her behavior is normal. Judgment and thought content normal.   Results for orders placed or performed during the hospital encounter of 10/17/18 (from the past 24 hour(s))  Urinalysis, Routine w reflex microscopic     Status: Abnormal   Collection Time: 10/17/18  1:58 PM  Result Value Ref Range   Color, Urine STRAW (A) YELLOW   APPearance CLEAR CLEAR   Specific Gravity, Urine 1.003 (L) 1.005 - 1.030   pH 7.0 5.0 - 8.0   Glucose, UA NEGATIVE NEGATIVE mg/dL   Hgb urine dipstick NEGATIVE NEGATIVE   Bilirubin Urine NEGATIVE NEGATIVE   Ketones, ur NEGATIVE NEGATIVE mg/dL   Protein, ur NEGATIVE NEGATIVE mg/dL   Nitrite NEGATIVE NEGATIVE   Leukocytes,Ua SMALL (A) NEGATIVE   RBC / HPF 0-5 0 - 5 RBC/hpf   WBC, UA 0-5 0 - 5 WBC/hpf   Bacteria, UA NONE SEEN NONE SEEN   Squamous Epithelial / LPF 0-5 0 - 5    [place] Mfm Ob  Follow Up  Result Date: 09/30/2018 ----------------------------------------------------------------------  OBSTETRICS REPORT                       (Signed Final 09/30/2018 04:33 pm) ---------------------------------------------------------------------- Patient Info  ID #:       [id number]                          D.O.B.:  1985/10/12 (32 yrs)  Name:       Lelon Mast                 Visit Date: 09/30/2018 02:10 pm ---------------------------------------------------------------------- Performed By  Performed By:  Alyssa Keatts          Ref. Address:     [address]  Attending:        Sander Nephew      [facility]                    MD                                       [facility]  Referred By:      [facility] [place] ---------------------------------------------------------------------- Orders   #  Description                          Code         Ordered By   1  [place] MFM OB FOLLOW UP                  58527.78     Sander Nephew  ----------------------------------------------------------------------   #  Order #                    Accession #                 Episode #   1  [id number]                  [id number]                  [id number]  ---------------------------------------------------------------------- Indications   Uterine fibroids                               O34.10   Encounter for other antenatal screening        Z36.2   follow-up   [redacted] weeks gestation of pregnancy                Z3A.30  ---------------------------------------------------------------------- Vital Signs                                                 Height:        5'3" ---------------------------------------------------------------------- Fetal Evaluation  Num Of Fetuses:         1  Fetal Heart Rate(bpm):  157  Cardiac Activity:       Observed  Presentation:  Cephalic  Placenta:               Anterior  P. Cord Insertion:      Visualized  Amniotic Fluid  AFI FV:      Within normal limits  AFI Sum(cm)     %Tile       Largest Pocket(cm)  15.96           57          5.52  RUQ(cm)       RLQ(cm)       LUQ(cm)        LLQ(cm)  3.31          4             5.52           3.13 ---------------------------------------------------------------------- Biometry  BPD:      77.6  mm     G. Age:  31w 1d         47  %    CI:        72.71   %    70 - 86                                                          FL/HC:      19.4   %    19.3 - 21.3  HC:      289.4  mm     G. Age:  31w 6d         42  %    HC/AC:      1.02        0.96 - 1.17  AC:      283.2  mm     G. Age:  32w 2d         85  %    FL/BPD:     72.3   %    71 - 87  FL:       56.1  mm     G. Age:  29w 4d          9  %    FL/AC:      19.8   %    20 - 24  HUM:      53.9  mm     G. Age:  31w 2d         63  %  Est. FW:    1748  gm    3 lb 14 oz      64  % ---------------------------------------------------------------------- OB History  Gravidity:    2         Term:   0        Prem:   0        SAB:   0  TOP:          0       Ectopic:  0        Living: 1 ---------------------------------------------------------------------- Gestational Age  LMP:           30w 6d        Date:  02/26/18                 EDD:   12/03/18  U/S Today:     31w 2d  EDD:   11/30/18  Best:          30w 6d     Det. By:  LMP  (02/26/18)          EDD:   12/03/18 ---------------------------------------------------------------------- Anatomy  Cranium:               Appears normal         Aortic Arch:            Previously seen  Cavum:                 Appears normal         Ductal Arch:            Previously seen  Ventricles:            Appears normal         Diaphragm:              Appears normal  Choroid Plexus:        Previously seen        Stomach:                Appears normal, left                                                                         sided  Cerebellum:            Previously seen        Abdomen:                Appears normal  Posterior Fossa:       Previously seen        Abdominal Wall:         Previously seen  Nuchal Fold:           Previously seen        Cord Vessels:           Previously seen  Face:                  Orbits and profile     Kidneys:                Appear normal                         previously seen  Lips:                  Previously seen        Bladder:                Appears normal  Thoracic:              Appears normal         Spine:                  Previously seen  Heart:                 Appears normal         Upper Extremities:      Previously seen                         (  4CH, axis, and                         situs)  RVOT:                  Appears normal         Lower Extremities:      Previously seen  LVOT:                  Appears normal  Other:  Heels and left 5th digit visualized. Nasal bone visualized. ---------------------------------------------------------------------- Myomas   Site                     L(cm)      W(cm)      D(cm)      Location   Fundus                   3.1        2.6        3.4   Anterior                 2.3        1.4        2.4   Posterior                4          4          3   ----------------------------------------------------------------------   Blood Flow                 RI        PI       Comments  ---------------------------------------------------------------------- Impression  Normal interval growth. ---------------------------------------------------------------------- Recommendations  Follow up as clinically indicated. ----------------------------------------------------------------------               Sander Nephew, MD Electronically Signed Final Report   09/30/2018 04:33 pm ----------------------------------------------------------------------  MAU Course  Procedures  MDM -suspect round ligament pain -UA: straw/sm leuks, sending urine for  culture -CE: long/closed/thick, unchanged from exam on 10/09/2018 -fFN performed prior to CE, but not sent based on lack of pt symptoms of PTL and no change from exam 8days ago -EFM: reactive with variables       -baseline: 140 (later 135)       -variability: moderate       -accels: present, 15x15       -decels: few variable       -TOCO: irritability, few ctx(?) not felt by pt -pt discharged to home in stable condition  Orders Placed This Encounter  Procedures  . Culture, OB Urine    Standing Status:   Standing    Number of Occurrences:   1  . Urinalysis, Routine w reflex microscopic    Standing Status:   Standing    Number of Occurrences:   1  . Discharge patient    Order Specific Question:   Discharge disposition    Answer:   01-Home or Self Care [1]    Order Specific Question:   Discharge patient date    Answer:   10/17/2018   Meds ordered this encounter  Medications  . Elastic Bandages & Supports (COMFORT FIT MATERNITY SUPP SM) MISC    Sig: 1 Units by Does not apply route daily as needed.    Dispense:  1 each    Refill:  0    Order Specific Question:   Supervising Provider  AnswerAletha Halim [id number]   Assessment and Plan   1. Pain of round ligament during pregnancy   2. [redacted] weeks gestation of pregnancy   3. NST (non-stress test) reactive    Allergies as of 10/17/2018   No Known Allergies     Medication List    TAKE these medications   acetaminophen 160 MG/5ML elixir Commonly known as:  TYLENOL Take 15 mg/kg by mouth every 4 (four) hours as needed for fever.   Comfort Fit Maternity Supp Sm Misc 1 Units by Does not apply route daily as needed.   metroNIDAZOLE 500 MG tablet Commonly known as:  FLAGYL Take 1 tablet (500 mg total) by mouth 2 (two) times daily. What changed:  Another medication with the same name was removed. Continue taking this medication, and follow the directions you see here.   prenatal multivitamin Tabs tablet Take 1 tablet by  mouth daily at 12 noon.   terconazole 0.4 % vaginal cream Commonly known as:  TERAZOL 7 Place 1 applicator vaginally at bedtime. What changed:  Another medication with the same name was removed. Continue taking this medication, and follow the directions you see here.      -RX and instructions given for pregnancy belt -s/sx of PTL/return MAU precautions given -pt discharged to home in stable condition  Elmyra Ricks E Alexios Keown 10/17/2018, 2:36 PM

## 2018-10-17 NOTE — Discharge Instructions (Signed)
Round Ligament Pain  The round ligament is a cord of muscle and tissue that helps support the uterus. It can become a source of pain during pregnancy if it becomes stretched or twisted as the baby grows. The pain usually begins in the second trimester (13-28 weeks) of pregnancy, and it can come and go until the baby is delivered. It is not a serious problem, and it does not cause harm to the baby. Round ligament pain is usually a short, sharp, and pinching pain, but it can also be a dull, lingering, and aching pain. The pain is felt in the lower side of the abdomen or in the groin. It usually starts deep in the groin and moves up to the outside of the hip area. The pain may occur when you:  Suddenly change position, such as quickly going from a sitting to standing position.  Roll over in bed.  Cough or sneeze.  Do physical activity. Follow these instructions at home:   Watch your condition for any changes.  When the pain starts, relax. Then try any of these methods to help with the pain: ? Sitting down. ? Flexing your knees up to your abdomen. ? Lying on your side with one pillow under your abdomen and another pillow between your legs. ? Sitting in a warm bath for 15-20 minutes or until the pain goes away.  Take over-the-counter and prescription medicines only as told by your health care provider.  Move slowly when you sit down or stand up.  Avoid long walks if they cause pain.  Stop or reduce your physical activities if they cause pain.  Keep all follow-up visits as told by your health care provider. This is important. Contact a health care provider if:  Your pain does not go away with treatment.  You feel pain in your back that you did not have before.  Your medicine is not helping. Get help right away if:  You have a fever or chills.  You develop uterine contractions.  You have vaginal bleeding.  You have nausea or vomiting.  You have diarrhea.  You have pain  when you urinate. Summary  Round ligament pain is felt in the lower abdomen or groin. It is usually a short, sharp, and pinching pain. It can also be a dull, lingering, and aching pain.  This pain usually begins in the second trimester (13-28 weeks). It occurs because the uterus is stretching with the growing baby, and it is not harmful to the baby.  You may notice the pain when you suddenly change position, when you cough or sneeze, or during physical activity.  Relaxing, flexing your knees to your abdomen, lying on one side, or taking a warm bath may help to get rid of the pain.  Get help from your health care provider if the pain does not go away or if you have vaginal bleeding, nausea, vomiting, diarrhea, or painful urination. This information is not intended to replace advice given to you by your health care provider. Make sure you discuss any questions you have with your health care provider. Document Released: 02/14/2008 Document Revised: 10/23/2017 Document Reviewed: 10/23/2017 Elsevier Interactive Patient Education  2019 [place]: You are not alone, Seventy-five percent of women have some sort of abdominal or back pain at some point in their pregnancy. Your baby is growing at a fast pace, which means that your whole body is rapidly trying to adjust to the changes. As your uterus  grows, your back may start feeling a bit under stress and this can result in back or abdominal pain that can go from mild, and therefore bearable, to severe pains that will not allow you to sit or lay down comfortably, When it comes to dealing with pregnancy-related pains and cramps, some pregnant women usually prefer natural remedies, which the market is filled with nowadays. For example, wearing a pregnancy support belt can help ease and lessen your discomfort and pain. WHAT ARE THE BENEFITS OF WEARING A PREGNANCY SUPPORT BELT? A pregnancy support belt provides support to the lower  portion of the belly taking some of the weight of the growing uterus and distributing to the other parts of your body. It is designed make you comfortable and gives you extra support. Over the years, the pregnancy apparel market has been studying the needs and wants of pregnant women and they have come up with the most comfortable pregnancy support belts that woman could ever ask for. In fact, you will no longer have to wear a stretched-out or bulky pregnancy belt that is visible underneath your clothes and makes you feel even more uncomfortable. Nowadays, a pregnancy support belt is made of comfortable and stretchy materials that will not irritate your skin but will actually make you feel at ease and you will not even notice you are wearing it. They are easy to put on and adjust during the day and can be worn at night for additional support.  BENEFITS:  Relives Back pain  Relieves Abdominal Muscle and Leg Pain  Stabilizes the Pelvic Ring  Offers a Cushioned Abdominal Lift Pad  Relieves pressure on the Sciatic Nerve Within Minutes WHERE TO GET YOUR PREGNANCY BELT: [employer] [phone] [address]  [place], [place] [address]

## 2018-10-17 NOTE — MAU Note (Addendum)
[name] is a 33 y.o. at [redacted]w[redacted]d here in MAU reporting: Right sided abdominal pain. Pt took tylenol and it hasn't helped much with the pain. Has had appendix out and has fibroids.  Onset of complaint: 0800 Pain score: 8 Vitals:   10/17/18 1246  BP: 120/77  Pulse: 84  Resp: 18  Temp: 98.2 F (36.8 C)  SpO2: 100%     FHT:142 Lab orders placed from triage:  Urinalysis

## 2018-10-19 LAB — CULTURE, OB URINE: Culture: 80000 — AB

## 2018-10-19 LAB — OB RESULTS CONSOLE GBS: GBS: POSITIVE

## 2018-10-23 ENCOUNTER — Ambulatory Visit (INDEPENDENT_AMBULATORY_CARE_PROVIDER_SITE_OTHER): Payer: BC Managed Care – PPO | Admitting: Family Medicine

## 2018-10-23 ENCOUNTER — Other Ambulatory Visit: Payer: Self-pay

## 2018-10-23 VITALS — BP 130/81 | HR 86 | Wt 196.0 lb

## 2018-10-23 DIAGNOSIS — B3731 Acute candidiasis of vulva and vagina: Secondary | ICD-10-CM

## 2018-10-23 DIAGNOSIS — Z3483 Encounter for supervision of other normal pregnancy, third trimester: Secondary | ICD-10-CM

## 2018-10-23 DIAGNOSIS — R8271 Bacteriuria: Secondary | ICD-10-CM | POA: Insufficient documentation

## 2018-10-23 DIAGNOSIS — B373 Candidiasis of vulva and vagina: Secondary | ICD-10-CM

## 2018-10-23 DIAGNOSIS — Z3480 Encounter for supervision of other normal pregnancy, unspecified trimester: Secondary | ICD-10-CM

## 2018-10-23 DIAGNOSIS — Z3A34 34 weeks gestation of pregnancy: Secondary | ICD-10-CM

## 2018-10-23 MED ORDER — AMOXICILLIN 875 MG PO TABS: 875.0000 mg | ORAL_TABLET | Freq: Two times a day (BID) | ORAL | 0 refills | Status: DC

## 2018-10-23 MED ORDER — TERCONAZOLE 0.4 % VA CREA: 1.0000 | TOPICAL_CREAM | Freq: Every day | VAGINAL | 0 refills | Status: DC

## 2018-10-23 NOTE — Progress Notes (Signed)
TELEHEALTH OBSTETRICS PRENATAL VIRTUAL VIDEO VISIT ENCOUNTER NOTE  Provider location: [facility]   I connected with [name] on 10/23/18 at  3:00 PM EDT by WebEx Video Encounter at home and verified that I am speaking with the correct person using two identifiers.   I discussed the limitations, risks, security and privacy concerns of performing an evaluation and management service by telephone and the availability of in person appointments. I also discussed with the patient that there may be a patient responsible charge related to this service. The patient expressed understanding and agreed to proceed. Subjective:  [name] is a 33 y.o. G2P0010 at [redacted]w[redacted]d being seen today for ongoing prenatal care.  She is currently monitored for the following issues for this low-risk pregnancy and has Fibroid; Endometriosis; Supervision of other normal pregnancy, antepartum; Anemia of mother in pregnancy, antepartum; and Yeast vaginitis on their problem list.  Patient reports no complaints.  Contractions: Irritability. Vag. Bleeding: None.  Movement: Present. Denies any leaking of fluid.   The following portions of the patient's history were reviewed and updated as appropriate: allergies, current medications, past family history, past medical history, past social history, past surgical history and problem list.   Objective:   Vitals:   10/23/18 1503  BP: 130/81  Pulse: 86  Weight: 196 lb (88.9 kg)    Fetal Status:     Movement: Present     General:  Alert, oriented and cooperative. Patient is in no acute distress.  Respiratory: Normal respiratory effort, no problems with respiration noted  Mental Status: Normal mood and affect. Normal behavior. Normal judgment and thought content.  Rest of physical exam deferred due to type of encounter  Imaging: [place] Mfm Ob Follow Up  Result Date: 09/30/2018  ----------------------------------------------------------------------  OBSTETRICS REPORT                       (Signed Final 09/30/2018 04:33 pm) ---------------------------------------------------------------------- Patient Info  ID #:       [id number]                          D.O.B.:  1986/03/20 (32 yrs)  Name:       [name]                 Visit Date: 09/30/2018 02:10 pm ---------------------------------------------------------------------- Performed By  Performed By:     Hubert Azure          Ref. Address:     [place]  Attending:        Sander Nephew      [facility]  Referred By:      [facility] [place] ---------------------------------------------------------------------- Orders   #  Description                          Code         Ordered By   1  [place] MFM OB FOLLOW UP                  42353.61     Sander Nephew  ----------------------------------------------------------------------   #  Order #                    Accession #                 Episode #   1  [id number]                  [id number]                  [id number]  ---------------------------------------------------------------------- Indications   Uterine fibroids                               O34.10   Encounter for other antenatal screening        Z36.2   follow-up   [redacted] weeks gestation of pregnancy                Z3A.30  ---------------------------------------------------------------------- Vital Signs                                                 Height:        5'3" ---------------------------------------------------------------------- Fetal Evaluation  Num Of Fetuses:         1  Fetal Heart Rate(bpm):  157  Cardiac Activity:       Observed  Presentation:           Cephalic  Placenta:                Anterior  P. Cord Insertion:      Visualized  Amniotic Fluid  AFI FV:      Within normal limits  AFI Sum(cm)     %Tile       Largest Pocket(cm)  15.96           57          5.52  RUQ(cm)       RLQ(cm)       LUQ(cm)        LLQ(cm)  3.31          4             5.52           3.13 ---------------------------------------------------------------------- Biometry  BPD:      77.6  mm     G. Age:  31w 1d         47  %    CI:  72.71   %    70 - 86                                                          FL/HC:      19.4   %    19.3 - 21.3  HC:      289.4  mm     G. Age:  31w 6d         42  %    HC/AC:      1.02        0.96 - 1.17  AC:      283.2  mm     G. Age:  32w 2d         85  %    FL/BPD:     72.3   %    71 - 87  FL:       56.1  mm     G. Age:  29w 4d          9  %    FL/AC:      19.8   %    20 - 24  HUM:      53.9  mm     G. Age:  31w 2d         63  %  Est. FW:    1748  gm    3 lb 14 oz      64  % ---------------------------------------------------------------------- OB History  Gravidity:    2         Term:   0        Prem:   0        SAB:   0  TOP:          0       Ectopic:  0        Living: 1 ---------------------------------------------------------------------- Gestational Age  LMP:           30w 6d        Date:  02/26/18                 EDD:   12/03/18  U/S Today:     31w 2d                                        EDD:   11/30/18  Best:          30w 6d     Det. By:  LMP  (02/26/18)          EDD:   12/03/18 ---------------------------------------------------------------------- Anatomy  Cranium:               Appears normal         Aortic Arch:            Previously seen  Cavum:                 Appears normal         Ductal Arch:            Previously seen  Ventricles:            Appears normal  Diaphragm:              Appears normal  Choroid Plexus:        Previously seen        Stomach:                Appears normal, left                                                                        sided   Cerebellum:            Previously seen        Abdomen:                Appears normal  Posterior Fossa:       Previously seen        Abdominal Wall:         Previously seen  Nuchal Fold:           Previously seen        Cord Vessels:           Previously seen  Face:                  Orbits and profile     Kidneys:                Appear normal                         previously seen  Lips:                  Previously seen        Bladder:                Appears normal  Thoracic:              Appears normal         Spine:                  Previously seen  Heart:                 Appears normal         Upper Extremities:      Previously seen                         (4CH, axis, and                         situs)  RVOT:                  Appears normal         Lower Extremities:      Previously seen  LVOT:                  Appears normal  Other:  Heels and left 5th digit visualized. Nasal bone visualized. ---------------------------------------------------------------------- Myomas   Site                     L(cm)      W(cm)      D(cm)      Location  Fundus                   3.1        2.6        3.4   Anterior                 2.3        1.4        2.4   Posterior                4          4          3   ----------------------------------------------------------------------   Blood Flow                 RI        PI       Comments  ---------------------------------------------------------------------- Impression  Normal interval growth. ---------------------------------------------------------------------- Recommendations  Follow up as clinically indicated. ----------------------------------------------------------------------               Sander Nephew, MD Electronically Signed Final Report   09/30/2018 04:33 pm ----------------------------------------------------------------------   Assessment and Plan:  Pregnancy: G2P0010 at [redacted]w[redacted]d  1. Supervision of other normal pregnancy, antepartum Good fetal movement  2.  Yeast infection of the vagina - terconazole (TERAZOL 7) 0.4 % vaginal cream; Place 1 applicator vaginally at bedtime.  Dispense: 45 g; Refill: 0  3. GBS bacteriuria Amoxicillin prescribed.   Preterm labor symptoms and general obstetric precautions including but not limited to vaginal bleeding, contractions, leaking of fluid and fetal movement were reviewed in detail with the patient. I discussed the assessment and treatment plan with the patient. The patient was provided an opportunity to ask questions and all were answered. The patient agreed with the plan and demonstrated an understanding of the instructions. The patient was advised to call back or seek an in-person office evaluation/go to MAU at [facility] for any urgent or concerning symptoms. Please refer to After Visit Summary for other counseling recommendations.   I provided 12 minutes of face-to-face time during this encounter.  Return in about 2 weeks (around 11/06/2018) for OB f/u, In Office.  No future appointments.  [place] [facility] [employer], [place]

## 2018-10-23 NOTE — Progress Notes (Signed)
Patient agrees to [employer] visit today. Kathrene Alu RN

## 2018-11-04 ENCOUNTER — Other Ambulatory Visit: Payer: Self-pay

## 2018-11-04 ENCOUNTER — Ambulatory Visit (INDEPENDENT_AMBULATORY_CARE_PROVIDER_SITE_OTHER): Payer: BC Managed Care – PPO | Admitting: Advanced Practice Midwife

## 2018-11-04 ENCOUNTER — Encounter: Payer: Self-pay | Admitting: Advanced Practice Midwife

## 2018-11-04 VITALS — BP 106/74 | HR 74 | Wt 204.0 lb

## 2018-11-04 DIAGNOSIS — O98811 Other maternal infectious and parasitic diseases complicating pregnancy, first trimester: Secondary | ICD-10-CM

## 2018-11-04 DIAGNOSIS — Z3480 Encounter for supervision of other normal pregnancy, unspecified trimester: Secondary | ICD-10-CM

## 2018-11-04 DIAGNOSIS — Z3A35 35 weeks gestation of pregnancy: Secondary | ICD-10-CM

## 2018-11-04 DIAGNOSIS — G5601 Carpal tunnel syndrome, right upper limb: Secondary | ICD-10-CM

## 2018-11-04 DIAGNOSIS — R8271 Bacteriuria: Secondary | ICD-10-CM

## 2018-11-04 NOTE — Patient Instructions (Signed)
Group B Streptococcus Infection During Pregnancy  Group B Streptococcus (GBS) is a type of bacteria (Streptococcus agalactiae) that is often found in healthy people, commonly in the rectum, vagina, and intestines. In people who are healthy and not pregnant, the bacteria rarely cause serious illness or complications. However, women who test positive for GBS during pregnancy can pass the bacteria to their baby during childbirth, which can cause serious infection in the baby after birth. Women with GBS may also have infections during their pregnancy or immediately after childbirth, such as such as urinary tract infections (UTIs) or infections of the uterus (uterine infections). Having GBS also increases a woman's risk of complications during pregnancy, such as early (preterm) labor or delivery, miscarriage, or stillbirth. Routine testing (screening) for GBS is recommended for all pregnant women. What increases the risk? You may have a higher risk for GBS infection during pregnancy if you had one during a past pregnancy. What are the signs or symptoms? In most cases, GBS infection does not cause symptoms in pregnant women. Signs and symptoms of a possible GBS-related infection may include: Labor starting before the 37th week of pregnancy. A UTI or bladder infection, which may cause: Fever. Pain or burning during urination. Frequent urination. Fever during labor, along with: Bad-smelling discharge. Uterine tenderness. Rapid heartbeat in the mother, baby, or both. Rare but serious symptoms of a possible GBS-related infection in women include: Blood infection (septicemia). This may cause fever, chills, or confusion. Lung infection (pneumonia). This may cause fever, chills, cough, rapid breathing, difficulty breathing, or chest pain. Bone, joint, skin, or soft tissue infection. How is this diagnosed? You may be screened for GBS between week 35 and week 37 of your pregnancy. If you have symptoms of  preterm labor, you may be screened earlier. This condition is diagnosed based on lab test results from: A swab of fluid from the vagina and rectum. A urine sample. How is this treated? This condition is treated with antibiotic medicine. When you go into labor, or as soon as your water breaks (your membranes rupture), you will be given antibiotics through an IV tube. Antibiotics will continue until after you give birth. If you are having a cesarean delivery, you do not need antibiotics unless your membranes have already ruptured. Follow these instructions at home: Take over-the-counter and prescription medicines only as told by your health care provider. Take your antibiotic medicine as told by your health care provider. Do not stop taking the antibiotic even if you start to feel better. Keep all pre-birth (prenatal) visits and follow-up visits as told by your health care provider. This is important. Contact a health care provider if: You have pain or burning when you urinate. You have to urinate frequently. You have a fever or chills. You develop a bad-smelling vaginal discharge. Get help right away if: Your membranes rupture. You go into labor. You have severe pain in your abdomen. You have difficulty breathing. You have chest pain. This information is not intended to replace advice given to you by your health care provider. Make sure you discuss any questions you have with your health care provider. Document Released: 08/14/2007 Document Revised: 12/02/2015 Document Reviewed: 12/01/2015 Elsevier Interactive Patient Education  2019 [place] of Pregnancy The third trimester is from week 28 through week 40 (months 7 through 9). The third trimester is a time when the unborn baby (fetus) is growing rapidly. At the end of the ninth month, the fetus is about 20 inches in length  and weighs 6-10 pounds. Body changes during your third trimester Your body will continue to go  through many changes during pregnancy. The changes vary from woman to woman. During the third trimester:  Your weight will continue to increase. You can expect to gain 25-35 pounds (11-16 kg) by the end of the pregnancy.  You may begin to get stretch marks on your hips, abdomen, and breasts.  You may urinate more often because the fetus is moving lower into your pelvis and pressing on your bladder.  You may develop or continue to have heartburn. This is caused by increased hormones that slow down muscles in the digestive tract.  You may develop or continue to have constipation because increased hormones slow digestion and cause the muscles that push waste through your intestines to relax.  You may develop hemorrhoids. These are swollen veins (varicose veins) in the rectum that can itch or be painful.  You may develop swollen, bulging veins (varicose veins) in your legs.  You may have increased body aches in the pelvis, back, or thighs. This is due to weight gain and increased hormones that are relaxing your joints.  You may have changes in your hair. These can include thickening of your hair, rapid growth, and changes in texture. Some women also have hair loss during or after pregnancy, or hair that feels dry or thin. Your hair will most likely return to normal after your baby is born.  Your breasts will continue to grow and they will continue to become tender. A yellow fluid (colostrum) may leak from your breasts. This is the first milk you are producing for your baby.  Your belly button may stick out.  You may notice more swelling in your hands, face, or ankles.  You may have increased tingling or numbness in your hands, arms, and legs. The skin on your belly may also feel numb.  You may feel short of breath because of your expanding uterus.  You may have more problems sleeping. This can be caused by the size of your belly, increased need to urinate, and an increase in your body's  metabolism.  You may notice the fetus "dropping," or moving lower in your abdomen (lightening).  You may have increased vaginal discharge.  You may notice your joints feel loose and you may have pain around your pelvic bone. What to expect at prenatal visits You will have prenatal exams every 2 weeks until week 36. Then you will have weekly prenatal exams. During a routine prenatal visit:  You will be weighed to make sure you and the baby are growing normally.  Your blood pressure will be taken.  Your abdomen will be measured to track your baby's growth.  The fetal heartbeat will be listened to.  Any test results from the previous visit will be discussed.  You may have a cervical check near your due date to see if your cervix has softened or thinned (effaced).  You will be tested for Group B streptococcus. This happens between 35 and 37 weeks. Your health care provider may ask you:  What your birth plan is.  How you are feeling.  If you are feeling the baby move.  If you have had any abnormal symptoms, such as leaking fluid, bleeding, severe headaches, or abdominal cramping.  If you are using any tobacco products, including cigarettes, chewing tobacco, and electronic cigarettes.  If you have any questions. Other tests or screenings that may be performed during your third trimester include:  Blood tests that check for low iron levels (anemia).  Fetal testing to check the health, activity level, and growth of the fetus. Testing is done if you have certain medical conditions or if there are problems during the pregnancy.  Nonstress test (NST). This test checks the health of your baby to make sure there are no signs of problems, such as the baby not getting enough oxygen. During this test, a belt is placed around your belly. The baby is made to move, and its heart rate is monitored during movement. What is false labor? False labor is a condition in which you feel small,  irregular tightenings of the muscles in the womb (contractions) that usually go away with rest, changing position, or drinking water. These are called Braxton Hicks contractions. Contractions may last for hours, days, or even weeks before true labor sets in. If contractions come at regular intervals, become more frequent, increase in intensity, or become painful, you should see your health care provider. What are the signs of labor?  Abdominal cramps.  Regular contractions that start at 10 minutes apart and become stronger and more frequent with time.  Contractions that start on the top of the uterus and spread down to the lower abdomen and back.  Increased pelvic pressure and dull back pain.  A watery or bloody mucus discharge that comes from the vagina.  Leaking of amniotic fluid. This is also known as your "water breaking." It could be a slow trickle or a gush. Let your health care provider know if it has a color or strange odor. If you have any of these signs, call your health care provider right away, even if it is before your due date. Follow these instructions at home: Medicines  Follow your health care provider's instructions regarding medicine use. Specific medicines may be either safe or unsafe to take during pregnancy.  Take a prenatal vitamin that contains at least 600 micrograms (mcg) of folic acid.  If you develop constipation, try taking a stool softener if your health care provider approves. Eating and drinking   Eat a balanced diet that includes fresh fruits and vegetables, whole grains, good sources of protein such as meat, eggs, or tofu, and low-fat dairy. Your health care provider will help you determine the amount of weight gain that is right for you.  Avoid raw meat and uncooked cheese. These carry germs that can cause birth defects in the baby.  If you have low calcium intake from food, talk to your health care provider about whether you should take a daily  calcium supplement.  Eat four or five small meals rather than three large meals a day.  Limit foods that are high in fat and processed sugars, such as fried and sweet foods.  To prevent constipation: ? Drink enough fluid to keep your urine clear or pale yellow. ? Eat foods that are high in fiber, such as fresh fruits and vegetables, whole grains, and beans. Activity  Exercise only as directed by your health care provider. Most women can continue their usual exercise routine during pregnancy. Try to exercise for 30 minutes at least 5 days a week. Stop exercising if you experience uterine contractions.  Avoid heavy lifting.  Do not exercise in extreme heat or humidity, or at high altitudes.  Wear low-heel, comfortable shoes.  Practice good posture.  You may continue to have sex unless your health care provider tells you otherwise. Relieving pain and discomfort  Take frequent breaks and rest with  your legs elevated if you have leg cramps or low back pain.  Take warm sitz baths to soothe any pain or discomfort caused by hemorrhoids. Use hemorrhoid cream if your health care provider approves.  Wear a good support bra to prevent discomfort from breast tenderness.  If you develop varicose veins: ? Wear support pantyhose or compression stockings as told by your healthcare provider. ? Elevate your feet for 15 minutes, 3-4 times a day. Prenatal care  Write down your questions. Take them to your prenatal visits.  Keep all your prenatal visits as told by your health care provider. This is important. Safety  Wear your seat belt at all times when driving.  Make a list of emergency phone numbers, including numbers for family, friends, the hospital, and police and fire departments. General instructions  Avoid cat litter boxes and soil used by cats. These carry germs that can cause birth defects in the baby. If you have a cat, ask someone to clean the litter box for you.  Do not travel  far distances unless it is absolutely necessary and only with the approval of your health care provider.  Do not use hot tubs, steam rooms, or saunas.  Do not drink alcohol.  Do not use any products that contain nicotine or tobacco, such as cigarettes and e-cigarettes. If you need help quitting, ask your health care provider.  Do not use any medicinal herbs or unprescribed drugs. These chemicals affect the formation and growth of the baby.  Do not douche or use tampons or scented sanitary pads.  Do not cross your legs for long periods of time.  To prepare for the arrival of your baby: ? Take prenatal classes to understand, practice, and ask questions about labor and delivery. ? Make a trial run to the hospital. ? Visit the hospital and tour the maternity area. ? Arrange for maternity or paternity leave through employers. ? Arrange for family and friends to take care of pets while you are in the hospital. ? Purchase a rear-facing car seat and make sure you know how to install it in your car. ? Pack your hospital bag. ? Prepare the babys nursery. Make sure to remove all pillows and stuffed animals from the baby's crib to prevent suffocation.  Visit your dentist if you have not gone during your pregnancy. Use a soft toothbrush to brush your teeth and be gentle when you floss. Contact a health care provider if:  You are unsure if you are in labor or if your water has broken.  You become dizzy.  You have mild pelvic cramps, pelvic pressure, or nagging pain in your abdominal area.  You have lower back pain.  You have persistent nausea, vomiting, or diarrhea.  You have an unusual or bad smelling vaginal discharge.  You have pain when you urinate. Get help right away if:  Your water breaks before 37 weeks.  You have regular contractions less than 5 minutes apart before 37 weeks.  You have a fever.  You are leaking fluid from your vagina.  You have spotting or bleeding from  your vagina.  You have severe abdominal pain or cramping.  You have rapid weight loss or weight gain.  You have shortness of breath with chest pain.  You notice sudden or extreme swelling of your face, hands, ankles, feet, or legs.  Your baby makes fewer than 10 movements in 2 hours.  You have severe headaches that do not go away when you take medicine.  You have vision changes. Summary  The third trimester is from week 28 through week 40, months 7 through 9. The third trimester is a time when the unborn baby (fetus) is growing rapidly.  During the third trimester, your discomfort may increase as you and your baby continue to gain weight. You may have abdominal, leg, and back pain, sleeping problems, and an increased need to urinate.  During the third trimester your breasts will keep growing and they will continue to become tender. A yellow fluid (colostrum) may leak from your breasts. This is the first milk you are producing for your baby.  False labor is a condition in which you feel small, irregular tightenings of the muscles in the womb (contractions) that eventually go away. These are called Braxton Hicks contractions. Contractions may last for hours, days, or even weeks before true labor sets in.  Signs of labor can include: abdominal cramps; regular contractions that start at 10 minutes apart and become stronger and more frequent with time; watery or bloody mucus discharge that comes from the vagina; increased pelvic pressure and dull back pain; and leaking of amniotic fluid. This information is not intended to replace advice given to you by your health care provider. Make sure you discuss any questions you have with your health care provider. Document Released: 05/01/2001 Document Revised: 06/12/2016 Document Reviewed: 06/12/2016 Elsevier Interactive Patient Education  2019 [employer].

## 2018-11-04 NOTE — Progress Notes (Signed)
   PRENATAL VISIT NOTE  Subjective:  [name] is a 33 y.o. G2P0010 at [redacted]w[redacted]d being seen today for ongoing prenatal care.  She is currently monitored for the following issues for this low-risk pregnancy and has Fibroid; Endometriosis; Supervision of other normal pregnancy, antepartum; Anemia of mother in pregnancy, antepartum; Yeast vaginitis; and GBS bacteriuria on their problem list.  Patient reports intermittent pain right wrist, no contractions, good fetal movement.  Contractions: Not present. Vag. Bleeding: None.  Movement: Present. Denies leaking of fluid.   The following portions of the patient's history were reviewed and updated as appropriate: allergies, current medications, past family history, past medical history, past social history, past surgical history and problem list.   Objective:   Vitals:   11/04/18 0814  BP: 106/74  Pulse: 74  Weight: 92.5 kg    Fetal Status: Fetal Heart Rate (bpm): 143   Movement: Present     General:  Alert, oriented and cooperative. Patient is in no acute distress.  Skin: Skin is warm and dry. No rash noted.   Cardiovascular: Normal heart rate noted  Respiratory: Normal respiratory effort, no problems with respiration noted  Abdomen: Soft, gravid, appropriate for gestational age.  Pain/Pressure: Absent     Pelvic: Cervical exam performed       Closed/Long/High Vertex confirmed by [place]  Extremities: Normal range of motion.  Edema: Trace  Mental Status: Normal mood and affect. Normal behavior. Normal judgment and thought content.   Assessment and Plan:  Pregnancy: G2P0010 at [redacted]w[redacted]d 1. Supervision of other normal pregnancy, antepartum      Reviewed signs of BH and labor, things to report  2.   Right carpal tunnel syndrome      Wrist splint  Term labor symptoms and general obstetric precautions including but not limited to vaginal bleeding, contractions, leaking of fluid and fetal movement were reviewed in detail with the patient. Please  refer to After Visit Summary for other counseling recommendations.   RTO or virtual  1-2 weeks  Hansel Feinstein, CNM

## 2018-11-04 NOTE — Progress Notes (Deleted)
  Subjective:    [name] is being seen today for her first obstetrical visit.  This {is/is not:9024} a planned pregnancy. She is at [redacted]w[redacted]d gestation. Her obstetrical history is significant for {ob risk factors:10154}. Relationship with FOB: {fob:16621}. Patient {does/does not:19097} intend to breast feed. Pregnancy history fully reviewed.  Patient reports {sx:14538}.  Review of Systems:   Review of Systems  Objective:     BP 106/74   Pulse 74   Wt 92.5 kg   LMP 02/26/2018 (Exact Date)   BMI 36.14 kg/m  Physical Exam  Exam    Assessment:    Pregnancy: G2P0010 Patient Active Problem List   Diagnosis Date Noted  . GBS bacteriuria 10/23/2018  . Yeast vaginitis 10/09/2018  . Anemia of mother in pregnancy, antepartum 06/04/2018  . Supervision of other normal pregnancy, antepartum 05/06/2018  . Fibroid 04/02/2016  . Endometriosis 04/02/2016       Plan:     Initial labs drawn. Prenatal vitamins. Problem list reviewed and updated. AFP3 discussed: {requests/ordered/declines:14581}. Role of ultrasound in pregnancy discussed; fetal survey: {requests/ordered/declines:14581}. Amniocentesis discussed: {amniocentesis:14582}. Follow up in {numbers 0-4:31231} weeks. ***% of *** min visit spent on counseling and coordination of care.  ***   Hansel Feinstein 11/04/2018

## 2018-11-05 LAB — GC/CHLAMYDIA PROBE AMP (~~LOC~~) NOT AT ARMC
Chlamydia: NEGATIVE
Neisseria Gonorrhea: NEGATIVE

## 2018-11-06 ENCOUNTER — Encounter: Payer: BC Managed Care – PPO | Admitting: Obstetrics & Gynecology

## 2018-11-10 ENCOUNTER — Encounter: Payer: BC Managed Care – PPO | Admitting: Family Medicine

## 2018-11-13 ENCOUNTER — Encounter: Payer: Self-pay | Admitting: Family Medicine

## 2018-11-13 ENCOUNTER — Ambulatory Visit (INDEPENDENT_AMBULATORY_CARE_PROVIDER_SITE_OTHER): Payer: BC Managed Care – PPO | Admitting: Family Medicine

## 2018-11-13 ENCOUNTER — Other Ambulatory Visit: Payer: Self-pay

## 2018-11-13 VITALS — BP 115/81 | HR 87 | Wt 204.0 lb

## 2018-11-13 DIAGNOSIS — R8271 Bacteriuria: Secondary | ICD-10-CM

## 2018-11-13 DIAGNOSIS — Z9889 Other specified postprocedural states: Secondary | ICD-10-CM

## 2018-11-13 DIAGNOSIS — Z3480 Encounter for supervision of other normal pregnancy, unspecified trimester: Secondary | ICD-10-CM

## 2018-11-13 DIAGNOSIS — O98813 Other maternal infectious and parasitic diseases complicating pregnancy, third trimester: Secondary | ICD-10-CM

## 2018-11-13 DIAGNOSIS — Z3A37 37 weeks gestation of pregnancy: Secondary | ICD-10-CM

## 2018-11-13 NOTE — Progress Notes (Signed)
   PRENATAL VISIT NOTE  Subjective:  [name] is a 33 y.o. G2P0010 at [redacted]w[redacted]d being seen today for ongoing prenatal care.  She is currently monitored for the following issues for this low-risk pregnancy and has Fibroid; Endometriosis; Supervision of other normal pregnancy, antepartum; Anemia of mother in pregnancy, antepartum; Yeast vaginitis; and GBS bacteriuria on their problem list.  Patient reports no complaints.  Contractions: Not present. Vag. Bleeding: None.  Movement: Present. Denies leaking of fluid.   The following portions of the patient's history were reviewed and updated as appropriate: allergies, current medications, past family history, past medical history, past social history, past surgical history and problem list.   Objective:   Vitals:   11/13/18 1554  BP: 115/81  Pulse: 87  Weight: 204 lb 0.6 oz (92.6 kg)    Fetal Status: Fetal Heart Rate (bpm): 138   Movement: Present     General:  Alert, oriented and cooperative. Patient is in no acute distress.  Skin: Skin is warm and dry. No rash noted.   Cardiovascular: Normal heart rate noted  Respiratory: Normal respiratory effort, no problems with respiration noted  Abdomen: Soft, gravid, appropriate for gestational age.  Pain/Pressure: Present     Pelvic: Cervical exam deferred        Extremities: Normal range of motion.  Edema: Trace  Mental Status: Normal mood and affect. Normal behavior. Normal judgment and thought content.   Assessment and Plan:  Pregnancy: G2P0010 at [redacted]w[redacted]d 1. Supervision of other normal pregnancy, antepartum FHT and FH normal  2. GBS bacteriuria Intrapartum abx  3. H/O myomectomy Ok for vaginal delivery   Preterm labor symptoms and general obstetric precautions including but not limited to vaginal bleeding, contractions, leaking of fluid and fetal movement were reviewed in detail with the patient. Please refer to After Visit Summary for other counseling recommendations.   Return in  about 1 week (around 11/20/2018) for OB f/u.  No future appointments.  Truett Mainland, DO

## 2018-11-25 ENCOUNTER — Encounter: Payer: Self-pay | Admitting: Advanced Practice Midwife

## 2018-11-25 ENCOUNTER — Ambulatory Visit (INDEPENDENT_AMBULATORY_CARE_PROVIDER_SITE_OTHER): Payer: 59 | Admitting: Advanced Practice Midwife

## 2018-11-25 ENCOUNTER — Other Ambulatory Visit: Payer: Self-pay

## 2018-11-25 VITALS — BP 123/82 | HR 71 | Wt 205.0 lb

## 2018-11-25 DIAGNOSIS — Z3483 Encounter for supervision of other normal pregnancy, third trimester: Secondary | ICD-10-CM

## 2018-11-25 DIAGNOSIS — Z3480 Encounter for supervision of other normal pregnancy, unspecified trimester: Secondary | ICD-10-CM

## 2018-11-25 DIAGNOSIS — Z3A38 38 weeks gestation of pregnancy: Secondary | ICD-10-CM

## 2018-11-25 NOTE — Progress Notes (Signed)
   PRENATAL VISIT NOTE  Subjective:  [name] is a 33 y.o. G2P0010 at [redacted]w[redacted]d being seen today for ongoing prenatal care.  She is currently monitored for the following issues for this low-risk pregnancy and has H/O myomectomy; Fibroid; Endometriosis; Supervision of other normal pregnancy, antepartum; Anemia of mother in pregnancy, antepartum; Yeast vaginitis; and GBS bacteriuria on their problem list.  Patient reports occasional contractions.  Contractions: Irritability.  .  Movement: Present. Denies leaking of fluid.   The following portions of the patient's history were reviewed and updated as appropriate: allergies, current medications, past family history, past medical history, past social history, past surgical history and problem list.   Objective:   Vitals:   11/25/18 0832  BP: 123/82  Pulse: 71  Weight: 93 kg    Fetal Status: Fetal Heart Rate (bpm): 143 Fundal Height: 38 cm Movement: Present  Presentation: Vertex  General:  Alert, oriented and cooperative. Patient is in no acute distress.  Skin: Skin is warm and dry. No rash noted.   Cardiovascular: Normal heart rate noted  Respiratory: Normal respiratory effort, no problems with respiration noted  Abdomen: Soft, gravid, appropriate for gestational age.  Pain/Pressure: Present     Pelvic: Cervical exam performed Dilation: Fingertip Effacement (%): 40 Station: Ballotable, -3  Extremities: Normal range of motion.  Edema: Trace  Mental Status: Normal mood and affect. Normal behavior. Normal judgment and thought content.   Assessment and Plan:  Pregnancy: G2P0010 at [redacted]w[redacted]d There are no diagnoses linked to this encounter. Term labor symptoms and general obstetric precautions including but not limited to vaginal bleeding, contractions, leaking of fluid and fetal movement were reviewed in detail with the patient. Please refer to After Visit Summary for other counseling recommendations.   Return in about 1 week (around  12/02/2018) for [facility].  Future Appointments  Date Time Provider [place]  12/02/2018  9:45 AM Seabron Spates, CNM [facility] None  01/12/2019 10:00 AM Lavonia Drafts, MD [facility] None    Hansel Feinstein, CNM

## 2018-11-25 NOTE — Progress Notes (Signed)
Patient would like to be check today for dilation.

## 2018-11-25 NOTE — Progress Notes (Deleted)
  Subjective:    [name] is being seen today for her first obstetrical visit.  This {is/is not:9024} a planned pregnancy. She is at [redacted]w[redacted]d gestation. Her obstetrical history is significant for {ob risk factors:10154}. Relationship with FOB: {fob:16621}. Patient {does/does not:19097} intend to breast feed. Pregnancy history fully reviewed.  Patient reports {sx:14538}.  Review of Systems:   Review of Systems  Objective:     BP 123/82   Pulse 71   Wt 93 kg   LMP 02/26/2018 (Exact Date)   BMI 36.31 kg/m  Physical Exam  Exam    Assessment:    Pregnancy: G2P0010 Patient Active Problem List   Diagnosis Date Noted  . GBS bacteriuria 10/23/2018  . Yeast vaginitis 10/09/2018  . Anemia of mother in pregnancy, antepartum 06/04/2018  . Supervision of other normal pregnancy, antepartum 05/06/2018  . Fibroid 04/02/2016  . Endometriosis 04/02/2016  . H/O myomectomy 12/12/2012       Plan:     Initial labs drawn. Prenatal vitamins. Problem list reviewed and updated. AFP3 discussed: {requests/ordered/declines:14581}. Role of ultrasound in pregnancy discussed; fetal survey: {requests/ordered/declines:14581}. Amniocentesis discussed: {amniocentesis:14582}. Follow up in {numbers 0-4:31231} weeks. ***% of *** min visit spent on counseling and coordination of care.  ***   Hansel Feinstein 11/25/2018

## 2018-11-25 NOTE — Patient Instructions (Signed)

## 2018-11-27 ENCOUNTER — Encounter (HOSPITAL_COMMUNITY): Payer: Self-pay | Admitting: *Deleted

## 2018-11-27 ENCOUNTER — Telehealth: Payer: Self-pay

## 2018-11-27 ENCOUNTER — Inpatient Hospital Stay (HOSPITAL_COMMUNITY)
Admission: AD | Admit: 2018-11-27 | Discharge: 2018-12-01 | DRG: 807 | Disposition: A | Discharge status: Home / Self Care | Payer: 59 | Attending: Obstetrics & Gynecology | Admitting: Obstetrics & Gynecology

## 2018-11-27 ENCOUNTER — Other Ambulatory Visit: Payer: Self-pay

## 2018-11-27 DIAGNOSIS — O99019 Anemia complicating pregnancy, unspecified trimester: Secondary | ICD-10-CM

## 2018-11-27 DIAGNOSIS — O1404 Mild to moderate pre-eclampsia, complicating childbirth: Secondary | ICD-10-CM | POA: Diagnosis not present

## 2018-11-27 DIAGNOSIS — Z3A39 39 weeks gestation of pregnancy: Secondary | ICD-10-CM | POA: Diagnosis not present

## 2018-11-27 DIAGNOSIS — B3731 Acute candidiasis of vulva and vagina: Secondary | ICD-10-CM

## 2018-11-27 DIAGNOSIS — Z1159 Encounter for screening for other viral diseases: Secondary | ICD-10-CM | POA: Diagnosis not present

## 2018-11-27 DIAGNOSIS — R8271 Bacteriuria: Secondary | ICD-10-CM

## 2018-11-27 DIAGNOSIS — O99824 Streptococcus B carrier state complicating childbirth: Secondary | ICD-10-CM | POA: Diagnosis present

## 2018-11-27 DIAGNOSIS — O1493 Unspecified pre-eclampsia, third trimester: Secondary | ICD-10-CM | POA: Diagnosis not present

## 2018-11-27 DIAGNOSIS — Z3480 Encounter for supervision of other normal pregnancy, unspecified trimester: Secondary | ICD-10-CM

## 2018-11-27 DIAGNOSIS — B373 Candidiasis of vulva and vagina: Secondary | ICD-10-CM

## 2018-11-27 DIAGNOSIS — Z3689 Encounter for other specified antenatal screening: Secondary | ICD-10-CM

## 2018-11-27 DIAGNOSIS — O368390 Maternal care for abnormalities of the fetal heart rate or rhythm, unspecified trimester, not applicable or unspecified: Secondary | ICD-10-CM

## 2018-11-27 DIAGNOSIS — Z9889 Other specified postprocedural states: Secondary | ICD-10-CM

## 2018-11-27 HISTORY — DX: Unspecified pre-eclampsia, third trimester: O14.93

## 2018-11-27 LAB — CBC
HCT: 39 % (ref 36.0–46.0)
Hemoglobin: 12.6 g/dL (ref 12.0–15.0)
MCH: 28.8 pg (ref 26.0–34.0)
MCHC: 32.3 g/dL (ref 30.0–36.0)
MCV: 89 fL (ref 80.0–100.0)
Platelets: 176 10*3/uL (ref 150–400)
RBC: 4.38 MIL/uL (ref 3.87–5.11)
RDW: 14.8 % (ref 11.5–15.5)
WBC: 11 10*3/uL — ABNORMAL HIGH (ref 4.0–10.5)
nRBC: 0 % (ref 0.0–0.2)

## 2018-11-27 LAB — TYPE AND SCREEN
ABO/RH(D): O POS
Antibody Screen: NEGATIVE

## 2018-11-27 LAB — COMPREHENSIVE METABOLIC PANEL
ALT: 14 U/L (ref 0–44)
AST: 21 U/L (ref 15–41)
Albumin: 3.1 g/dL — ABNORMAL LOW (ref 3.5–5.0)
Alkaline Phosphatase: 190 U/L — ABNORMAL HIGH (ref 38–126)
Anion gap: 11 (ref 5–15)
BUN: <5 mg/dL — ABNORMAL LOW (ref 6–20)
CO2: 21 mmol/L — ABNORMAL LOW (ref 22–32)
Calcium: 9.4 mg/dL (ref 8.9–10.3)
Chloride: 104 mmol/L (ref 98–111)
Creatinine, Ser: 0.92 mg/dL (ref 0.44–1.00)
GFR calc Af Amer: >60 mL/min (ref >60)
GFR calc non Af Amer: >60 mL/min (ref >60)
Glucose, Bld: 75 mg/dL (ref 70–99)
Potassium: 3.9 mmol/L (ref 3.5–5.1)
Sodium: 136 mmol/L (ref 135–145)
Total Bilirubin: 0.6 mg/dL (ref 0.3–1.2)
Total Protein: 7.3 g/dL (ref 6.5–8.1)

## 2018-11-27 LAB — PROTEIN / CREATININE RATIO, URINE
Creatinine, Urine: 35.4 mg/dL
Protein Creatinine Ratio: 1.05 mg/mg{Cre} — ABNORMAL HIGH (ref 0.00–0.15)
Total Protein, Urine: 37 mg/dL

## 2018-11-27 LAB — SARS CORONAVIRUS 2 BY RT PCR (HOSPITAL ORDER, PERFORMED IN ~~LOC~~ HOSPITAL LAB): SARS Coronavirus 2: NEGATIVE

## 2018-11-27 LAB — ABO/RH: ABO/RH(D): O POS

## 2018-11-27 MED ORDER — HYDROXYZINE HCL 50 MG PO TABS: 50.0000 mg | ORAL_TABLET | Freq: Four times a day (QID) | ORAL | Status: DC | PRN

## 2018-11-27 MED ORDER — ZOLPIDEM TARTRATE 5 MG PO TABS: 5.0000 mg | ORAL_TABLET | Freq: Every evening | ORAL | Status: DC | PRN

## 2018-11-27 MED ORDER — TERBUTALINE SULFATE 1 MG/ML IJ SOLN: 0.2500 mg | Freq: Once | INTRAMUSCULAR | Status: DC | PRN

## 2018-11-27 MED ORDER — CYCLOBENZAPRINE HCL 10 MG PO TABS
10.0000 mg | ORAL_TABLET | Freq: Once | ORAL | Status: AC
  Administered 2018-11-27: 10 mg via ORAL
  Filled 2018-11-27: qty 1

## 2018-11-27 MED ORDER — OXYTOCIN BOLUS FROM INFUSION
500.0000 mL | Freq: Once | INTRAVENOUS | Status: AC
  Administered 2018-11-29: 500 mL via INTRAVENOUS

## 2018-11-27 MED ORDER — LACTATED RINGERS IV SOLN: 500.0000 mL | INTRAVENOUS | Status: DC | PRN

## 2018-11-27 MED ORDER — FENTANYL-BUPIVACAINE-NACL 0.5-0.125-0.9 MG/250ML-% EP SOLN
12.0000 mL/h | EPIDURAL | Status: DC | PRN
  Filled 2018-11-27 (×2): qty 250

## 2018-11-27 MED ORDER — OXYCODONE-ACETAMINOPHEN 5-325 MG PO TABS: 1.0000 | ORAL_TABLET | ORAL | Status: DC | PRN

## 2018-11-27 MED ORDER — DIPHENHYDRAMINE HCL 50 MG/ML IJ SOLN: 12.5000 mg | INTRAMUSCULAR | Status: DC | PRN

## 2018-11-27 MED ORDER — OXYTOCIN 40 UNITS IN NORMAL SALINE INFUSION - SIMPLE MED: 1.0000 m[IU]/min | INTRAVENOUS | Status: DC

## 2018-11-27 MED ORDER — EPHEDRINE 5 MG/ML INJ: 10.0000 mg | INTRAVENOUS | Status: DC | PRN

## 2018-11-27 MED ORDER — ONDANSETRON HCL 4 MG/2ML IJ SOLN
4.0000 mg | Freq: Four times a day (QID) | INTRAMUSCULAR | Status: DC | PRN
  Administered 2018-11-28 (×2): 4 mg via INTRAVENOUS
  Filled 2018-11-27 (×2): qty 2

## 2018-11-27 MED ORDER — ACETAMINOPHEN 325 MG PO TABS
650.0000 mg | ORAL_TABLET | ORAL | Status: DC | PRN
  Administered 2018-11-28: 650 mg via ORAL
  Filled 2018-11-27: qty 2

## 2018-11-27 MED ORDER — PHENYLEPHRINE 40 MCG/ML (10ML) SYRINGE FOR IV PUSH (FOR BLOOD PRESSURE SUPPORT): 80.0000 ug | PREFILLED_SYRINGE | INTRAVENOUS | Status: DC | PRN

## 2018-11-27 MED ORDER — OXYTOCIN 40 UNITS IN NORMAL SALINE INFUSION - SIMPLE MED: 2.5000 [IU]/h | INTRAVENOUS | Status: DC

## 2018-11-27 MED ORDER — LACTATED RINGERS IV SOLN
INTRAVENOUS | Status: DC
  Administered 2018-11-27 – 2018-11-28 (×5): via INTRAVENOUS

## 2018-11-27 MED ORDER — OXYCODONE-ACETAMINOPHEN 5-325 MG PO TABS: 2.0000 | ORAL_TABLET | ORAL | Status: DC | PRN

## 2018-11-27 MED ORDER — FLEET ENEMA 7-19 GM/118ML RE ENEM: 1.0000 | ENEMA | Freq: Every day | RECTAL | Status: DC | PRN

## 2018-11-27 MED ORDER — OXYTOCIN 40 UNITS IN NORMAL SALINE INFUSION - SIMPLE MED
1.0000 m[IU]/min | INTRAVENOUS | Status: DC
  Administered 2018-11-28 (×2): 2 m[IU]/min via INTRAVENOUS
  Filled 2018-11-27: qty 1000

## 2018-11-27 MED ORDER — SODIUM CHLORIDE 0.9 % IV SOLN
5.0000 10*6.[IU] | Freq: Once | INTRAVENOUS | Status: AC
  Administered 2018-11-27: 5 10*6.[IU] via INTRAVENOUS
  Filled 2018-11-27: qty 5

## 2018-11-27 MED ORDER — SOD CITRATE-CITRIC ACID 500-334 MG/5ML PO SOLN: 30.0000 mL | ORAL | Status: DC | PRN

## 2018-11-27 MED ORDER — FENTANYL CITRATE (PF) 100 MCG/2ML IJ SOLN
50.0000 ug | INTRAMUSCULAR | Status: DC | PRN
  Administered 2018-11-28: 100 ug via INTRAVENOUS
  Filled 2018-11-27: qty 2

## 2018-11-27 MED ORDER — LACTATED RINGERS IV SOLN
500.0000 mL | Freq: Once | INTRAVENOUS | Status: AC
  Administered 2018-11-28: 500 mL via INTRAVENOUS

## 2018-11-27 MED ORDER — LIDOCAINE HCL (PF) 1 % IJ SOLN: 30.0000 mL | INTRAMUSCULAR | Status: DC | PRN

## 2018-11-27 MED ORDER — PENICILLIN G 3 MILLION UNITS IVPB - SIMPLE MED
3.0000 10*6.[IU] | INTRAVENOUS | Status: DC
  Administered 2018-11-28 (×6): 3 10*6.[IU] via INTRAVENOUS
  Filled 2018-11-27 (×6): qty 100

## 2018-11-27 NOTE — MAU Note (Signed)
Covid swab collected. PT tolerated well. PT asymptomatic

## 2018-11-27 NOTE — MAU Provider Note (Signed)
History     CSN: [id number]  Arrival date and time: 11/27/18 1445   First Provider Initiated Contact with Patient 11/27/18 1521      Chief Complaint  Patient presents with  . Dizziness  . Hypertension   [name] is a 33 y.o. G2P0010 at [redacted]w[redacted]d who presents to MAU for preeclampsia evaluation after taking her BP at work and found it to be 131/91 with repeat of 127/88, followed by 136/94 with automated cuff. Then an RN at her work did a manual BP and it was 122/88. These BPs were taken over a 84minute time period. Pt reports she decided to take her BP because around 1230/1245 she started feeling hot and got a HA. Pt took her temp, which she reports was normal. Pt reports then she started experiencing dizziness. Pt reports her HA is still present and rates as 5/10. Pt denies taking any medications for her HA. Pt denies hx of high BP.  Pt reports her morning sickness started up a couple of days ago and she threw up twice on Tuesday, but denies symptoms of N/V in MAU at this time.  Pt denies blurry vision/seeing spots, N/V, epigastric pain, swelling in face and hands, sudden weight gain. Pt denies chest pain and SOB.  Pt denies constipation, diarrhea, or urinary problems. Pt denies fever, chills, fatigue, sweating or changes in appetite. Pt denies dizziness, light-headedness, weakness.  Pt denies VB, ctx, LOF and reports good FM.  Current pregnancy problems? GBS+ Blood Type? O Positive Allergies? NKDA Current medications? PNVs Current PNC & next appt? [facility], Monday 12/01/2018   OB History    Gravida  2   Para  0   Term  0   Preterm  0   AB  1   Living  0     SAB  1   TAB  0   Ectopic  0   Multiple  0   Live Births  0           Past Medical History:  Diagnosis Date  . Endometriosis   . Ovarian cyst   . Uterine fibroid     Past Surgical History:  Procedure Laterality Date  . APPENDECTOMY  03/15/11  . ROBOT ASSISTED MYOMECTOMY N/A 12/11/2012    Procedure: ROBOTIC ASSISTED MYOMECTOMY;  Surgeon: Marvene Staff, MD;  Location: [facility];  Service: Gynecology;  Laterality: N/A;    Family History  Problem Relation Age of Onset  . Cancer Maternal Aunt        pt unaware of what kind  . Cancer Maternal Grandmother        pt unawaure of what kinf    Social History   Tobacco Use  . Smoking status: Never Smoker  . Smokeless tobacco: Never Used  Substance Use Topics  . Alcohol use: No  . Drug use: No    Allergies: No Known Allergies  Medications Prior to Admission  Medication Sig Dispense Refill Last Dose  . acetaminophen (TYLENOL) 160 MG/5ML elixir Take 15 mg/kg by mouth every 4 (four) hours as needed for fever.     Water engineer Bandages & Supports (COMFORT FIT MATERNITY SUPP SM) MISC 1 Units by Does not apply route daily as needed. (Patient not taking: Reported on 11/25/2018) 1 each 0   . Prenatal Vit-Fe Fumarate-FA (PRENATAL MULTIVITAMIN) TABS tablet Take 1 tablet by mouth daily at 12 noon.       Review of Systems  Constitutional: Negative for chills, diaphoresis, fatigue  and fever.  Respiratory: Negative for shortness of breath.   Cardiovascular: Negative for chest pain.  Gastrointestinal: Negative for abdominal pain, constipation, diarrhea, nausea and vomiting.  Genitourinary: Negative for dysuria, flank pain, frequency, pelvic pain, urgency, vaginal bleeding and vaginal discharge.  Neurological: Positive for headaches. Negative for dizziness, weakness and light-headedness.   Physical Exam   Blood pressure (!) 142/93, pulse 81, temperature 98.4 F (36.9 C), temperature source Oral, resp. rate 16, last menstrual period 02/26/2018, SpO2 100 %, unknown if currently breastfeeding.  Patient Vitals for the past 24 hrs:  BP Temp Temp src Pulse Resp SpO2  11/27/18 1715 (!) 142/93 - - 81 - 100 %  11/27/18 1700 (!) 131/93 98.4 F (36.9 C) Oral 76 16 100 %  11/27/18 1649 122/79 - - 70 - -  11/27/18 1630 (!) 144/84 - - 77 - -   11/27/18 1615 (!) 136/91 - - 89 - -  11/27/18 1600 (!) 124/92 - - 76 - -  11/27/18 1545 127/89 - - 84 - -  11/27/18 1530 132/90 - - 86 - -  11/27/18 1515 126/84 - - 73 - -  11/27/18 1500 130/81 - - 78 - -  11/27/18 1455 125/89 - - 80 - -   Physical Exam  Constitutional: She is oriented to person, place, and time. She appears well-developed and well-nourished. No distress.  HENT:  Head: Normocephalic and atraumatic.  Respiratory: Effort normal.  GI: Soft. She exhibits no distension and no mass. There is no abdominal tenderness. There is no rebound and no guarding.  Neurological: She is alert and oriented to person, place, and time.  Skin: Skin is warm and dry. She is not diaphoretic.  Psychiatric: She has a normal mood and affect. Her behavior is normal. Judgment and thought content normal.   Results for orders placed or performed during the hospital encounter of 11/27/18 (from the past 24 hour(s))  Protein / creatinine ratio, urine     Status: Abnormal   Collection Time: 11/27/18  3:20 PM  Result Value Ref Range   Creatinine, Urine 35.40 mg/dL   Total Protein, Urine 37 mg/dL   Protein Creatinine Ratio 1.05 (H) 0.00 - 0.15 mg/mg[Cre]  CBC     Status: Abnormal   Collection Time: 11/27/18  3:36 PM  Result Value Ref Range   WBC 11.0 (H) 4.0 - 10.5 K/uL   RBC 4.38 3.87 - 5.11 MIL/uL   Hemoglobin 12.6 12.0 - 15.0 g/dL   HCT 39.0 36.0 - 46.0 %   MCV 89.0 80.0 - 100.0 fL   MCH 28.8 26.0 - 34.0 pg   MCHC 32.3 30.0 - 36.0 g/dL   RDW 14.8 11.5 - 15.5 %   Platelets 176 150 - 400 K/uL   nRBC 0.0 0.0 - 0.2 %  Comprehensive metabolic panel     Status: Abnormal   Collection Time: 11/27/18  3:36 PM  Result Value Ref Range   Sodium 136 135 - 145 mmol/L   Potassium 3.9 3.5 - 5.1 mmol/L   Chloride 104 98 - 111 mmol/L   CO2 21 (L) 22 - 32 mmol/L   Glucose, Bld 75 70 - 99 mg/dL   BUN <5 (L) 6 - 20 mg/dL   Creatinine, Ser 0.92 0.44 - 1.00 mg/dL   Calcium 9.4 8.9 - 10.3 mg/dL   Total  Protein 7.3 6.5 - 8.1 g/dL   Albumin 3.1 (L) 3.5 - 5.0 g/dL   AST 21 15 - 41 U/L   ALT  14 0 - 44 U/L   Alkaline Phosphatase 190 (H) 38 - 126 U/L   Total Bilirubin 0.6 0.3 - 1.2 mg/dL   GFR calc non Af Amer >60 >60 mL/min   GFR calc Af Amer >60 >60 mL/min   Anion gap 11 5 - 15   No results found.  MAU Course  Procedures  MDM -preeclampsia evaluation + HA, denies hx of BP issues -highest BP in MAU 132/90 -Flexeril 10mg  given, pt reports HA now 0/10 -CBC: WNL for pregnancy (platelets 176, were in 250s 14mo ago) -CMP: no abnormalities requiring treatment (AST/ALT 21/14) -PCr: 1.05 -EFM: reactive with variables (16:36)       -baseline: 140       -variability: moderate       -accels: present, 15x15       -decels: present, multiple variables       -TOCO: irregular ctx, closest q49min -called and spoke with Dr. Roselie Awkward @1712 , per Dr. Roselie Awkward, will admit for delivery -called and spoke with Dr. Vanetta Shawl @1717 , care transferred to Dr. Vanetta Shawl -admit pt to L&D for delivery Pt informed that the ultrasound is considered a limited OB ultrasound and is not intended to be a complete ultrasound exam.  Patient also informed that the ultrasound is not being completed with the intent of assessing for fetal or placental anomalies or any pelvic abnormalities.  Explained that the purpose of today's ultrasound is to assess for  presentation (VTX).  Patient acknowledges the purpose of the exam and the limitations of the study.    Orders Placed This Encounter  Procedures  . SARS Coronavirus 2 (CEPHEID - Performed in [place] hospital lab), Hosp Order    Standing Status:   Standing    Number of Occurrences:   1    Order Specific Question:   Rule Out    Answer:   Yes  . CBC    Standing Status:   Standing    Number of Occurrences:   1  . Comprehensive metabolic panel    Standing Status:   Standing    Number of Occurrences:   1  . Protein / creatinine ratio, urine    Standing Status:   Standing    Number  of Occurrences:   1   Meds ordered this encounter  Medications  . cyclobenzaprine (FLEXERIL) tablet 10 mg   Assessment and Plan   -admit to L&D for delivery  [name] 11/27/2018, 5:31 PM

## 2018-11-27 NOTE — Progress Notes (Signed)
Patient Vitals for the past 4 hrs:  BP Pulse Resp  11/27/18 2300 126/77 79 14  11/27/18 2200 129/86 (!) 58 14  11/27/18 2100 123/82 63 14  11/27/18 2000 127/77 64 16   Foley still in, mild cramps. FHR Cat 1. WIll start pitocin when foley falls out.

## 2018-11-27 NOTE — H&P (Signed)
[name] is a 33 y.o. female presenting for IOL 2/2 preeclampsia without severe features.  Patient today states she was not feeling well, had a mild headache (went away with Flexeril in MAU), and took her BP at work, found to have an elevated DBP in 90s. Called her doctor, came in to MAU, and was found to have multiple mild range elevated BP, a few SBP in 140s, a few DBP in 90s. Her PCr was >1. Headache went away with flexeril. All other labs were normal. She was admitted for IOL for preeclampsia.   Of note, patient does have history of myomectomy.    OB History    Gravida  2   Para  0   Term  0   Preterm  0   AB  1   Living  0     SAB  1   TAB  0   Ectopic  0   Multiple  0   Live Births  0          Past Medical History:  Diagnosis Date  . Endometriosis   . Ovarian cyst   . Preeclampsia, third trimester 11/27/2018  . Uterine fibroid    Past Surgical History:  Procedure Laterality Date  . APPENDECTOMY  03/15/11  . ROBOT ASSISTED MYOMECTOMY N/A 12/11/2012   Procedure: ROBOTIC ASSISTED MYOMECTOMY;  Surgeon: Marvene Staff, MD;  Location: [facility];  Service: Gynecology;  Laterality: N/A;   Family History: family history includes Cancer in her maternal aunt and maternal grandmother. Social History:  reports that she has never smoked. She has never used smokeless tobacco. She reports that she does not drink alcohol or use drugs.     Maternal Diabetes: No Genetic Screening: Normal Maternal Ultrasounds/Referrals: Normal Fetal Ultrasounds or other Referrals:  None Maternal Substance Abuse:  No Significant Maternal Medications:  None Significant Maternal Lab Results:  Group B Strep positive Other Comments:  History of myomectomy  ROS History Dilation: 2 Effacement (%): 60 Station: -2 Exam by:: Lynsay Fesperman Blood pressure (!) 138/98, pulse 84, temperature 98.3 F (36.8 C), resp. rate 16, last menstrual period 02/26/2018, SpO2 100 %, unknown if currently  breastfeeding. Exam Physical Exam  Prenatal labs: ABO, Rh: --/--/O POS, O POS Performed at [facility], [place] [address] [place], [place] [address]  [phone] 1538) Antibody: NEG (07/09 1538) Rubella: 4.12 (12/17 1044) RPR: Non Reactive (04/23 0954)  HBsAg: Negative (12/17 1044)  HIV: Non Reactive (04/23 0954)  GBS:   Pos in urine  Assessment/Plan: 33 y.o. G2P0010 [redacted]w[redacted]d here for IOL 2/2 preeclampsia without severe features  #Labor: FB placed at 19:00, pitocin will be started #ID: GBS Pos (urine), PCN #Pain: Epidural on request #Contraception: IUD #Gender: Girl #Feeding: Breast    Katherine Basset, DO 11/27/2018, 7:07 PM

## 2018-11-27 NOTE — Telephone Encounter (Signed)
Pt called the office stating that she checked her BP and it was 139/91. Pt states that she is having dizziness and headache. Pt advised to go to [facility] at [facility]. Understanding was voiced.[name], CMA

## 2018-11-27 NOTE — MAU Note (Signed)
Patient was at work and started feeling hot and sweaty, and dizzy.  Also c/o HA 4/10.  Denies taking anything for headache pain.  No hx of HTN.  No problems with pregnancy.  Denies visual changes.  Reports good fetal movement.

## 2018-11-28 ENCOUNTER — Inpatient Hospital Stay (HOSPITAL_COMMUNITY): Payer: 59 | Admitting: Anesthesiology

## 2018-11-28 LAB — RPR: RPR Ser Ql: NONREACTIVE

## 2018-11-28 LAB — PLATELET COUNT: Platelets: 156 10*3/uL (ref 150–400)

## 2018-11-28 MED ORDER — LIDOCAINE HCL (PF) 1 % IJ SOLN
INTRAMUSCULAR | Status: DC | PRN
  Administered 2018-11-28: 5 mL via EPIDURAL

## 2018-11-28 MED ORDER — SODIUM CHLORIDE (PF) 0.9 % IJ SOLN
INTRAMUSCULAR | Status: DC | PRN
  Administered 2018-11-28: 12 mL/h via EPIDURAL

## 2018-11-28 NOTE — Anesthesia Procedure Notes (Signed)
Epidural Patient location during procedure: OB Start time: 11/28/2018 2:31 PM End time: 11/28/2018 2:43 PM  Staffing Anesthesiologist: Barnet Glasgow, MD Performed: anesthesiologist   Preanesthetic Checklist Completed: patient identified, site marked, surgical consent, pre-op evaluation, timeout performed, IV checked, risks and benefits discussed and monitors and equipment checked  Epidural Patient position: sitting Prep: site prepped and draped and DuraPrep Patient monitoring: continuous pulse ox and blood pressure Approach: midline Location: L3-L4 Injection technique: LOR air  Needle:  Needle type: Tuohy  Needle gauge: 17 G Needle length: 9 cm and 9 Needle insertion depth: 5 cm cm Catheter type: closed end flexible Catheter size: 19 Gauge Catheter at skin depth: 10 cm Test dose: negative  Assessment Events: blood not aspirated, injection not painful, no injection resistance, negative IV test and no paresthesia  Additional Notes Patient identified. Risks/Benefits/Options discussed with patient including but not limited to bleeding, infection, nerve damage, paralysis, failed block, incomplete pain control, headache, blood pressure changes, nausea, vomiting, reactions to medication both or allergic, itching and postpartum back pain. Confirmed with bedside nurse the patient's most recent platelet count. Confirmed with patient that they are not currently taking any anticoagulation, have any bleeding history or any family history of bleeding disorders. Patient expressed understanding and wished to proceed. All questions were answered. Sterile technique was used throughout the entire procedure. Please see nursing notes for vital signs. Test dose was given through epidural needle and negative prior to continuing to dose epidural or start infusion. Warning signs of high block given to the patient including shortness of breath, tingling/numbness in hands, complete motor block, or any  concerning symptoms with instructions to call for help. Patient was given instructions on fall risk and not to get out of bed. All questions and concerns addressed with instructions to call with any issues. 1 Attempt (S) . Patient tolerated procedure well.

## 2018-11-28 NOTE — Progress Notes (Signed)
LABOR PROGRESS NOTE  [name] is a 33 y.o. G2P0010 at [redacted]w[redacted]d  admitted for IOL 2/2 preE w/o SF  Subjective: She is doing well resting in bed. She is in minimal discomfort with contractions.  Objective: BP 121/78   Pulse 100   Temp 97.7 F (36.5 C) (Oral)   Resp 16   LMP 02/26/2018 (Exact Date)   SpO2 100%  or  Vitals:   11/28/18 1731 11/28/18 1802 11/28/18 1831 11/28/18 1901  BP: 122/82 120/79 104/68 121/78  Pulse: 61 (!) 57 64 100  Resp:      Temp:      TempSrc:      SpO2:         Dilation: 7 Effacement (%): 90 Cervical Position: Posterior Station: -3 Presentation: Vertex Exam by:: m wilkins rnc FHT: baseline rate 140, moderate varibility, 15 X 15 acel, early decel Toco: 2-3 mins  Labs: Lab Results  Component Value Date   WBC 11.0 (H) 11/27/2018   HGB 12.6 11/27/2018   HCT 39.0 11/27/2018   MCV 89.0 11/27/2018   PLT 156 11/28/2018    Patient Active Problem List   Diagnosis Date Noted  . Preeclampsia, third trimester 11/27/2018  . GBS bacteriuria 10/23/2018  . Yeast vaginitis 10/09/2018  . Anemia of mother in pregnancy, antepartum 06/04/2018  . Supervision of other normal pregnancy, antepartum 05/06/2018  . Fibroid 04/02/2016  . Endometriosis 04/02/2016  . H/O myomectomy 12/12/2012    Assessment / Plan: 33 y.o. G2P0010 at [redacted]w[redacted]d here for IOL 2/2 preE w/o SF.  Labor: Continue cervical check for progression of labor. Pitocin 12 mU/min. IUPC placed @ 1850 Fetal Wellbeing:  Cat I Pain Control:  Epidural Anticipated MOD:  Vaginal  Marigrace Mccole Autry-Lott, D.O. Family Medicine Resident, PGY-1 11/28/2018, 7:21 PM

## 2018-11-28 NOTE — Progress Notes (Signed)
Patient Vitals for the past 4 hrs:  BP Temp Temp src Pulse Resp  11/28/18 0631 131/83 - - (!) 57 12  11/28/18 0601 129/88 - - (!) 58 -  11/28/18 0559 134/88 - - (!) 57 -  11/28/18 0501 122/88 98.3 F (36.8 C) Oral 89 14  11/28/18 0402 (!) 129/93 - - 72 14  11/28/18 0301 137/89 - - 91 -   Foley fell out about an hour ago, pt waiting on breakfast (pit had juist been started, but pt hungry so ordered a light breakfast). Will start again when done eating. FHR Cat 1.  Occ mild ctx. cx 3-4 per RN.

## 2018-11-28 NOTE — Anesthesia Preprocedure Evaluation (Signed)
Anesthesia Evaluation  Patient identified by MRN, date of birth, ID band Patient awake    Reviewed: Allergy & Precautions, NPO status , Patient's Chart, lab work & pertinent test results  Airway Mallampati: II  TM Distance: >3 FB Neck ROM: Full    Dental no notable dental hx. (+) Teeth Intact   Pulmonary neg pulmonary ROS,    Pulmonary exam normal breath sounds clear to auscultation       Cardiovascular Exercise Tolerance: Good hypertension, Pt. on medications Normal cardiovascular exam Rhythm:Regular Rate:Normal     Neuro/Psych  Headaches, negative psych ROS   GI/Hepatic negative GI ROS, Neg liver ROS,   Endo/Other  negative endocrine ROS  Renal/GU negative Renal ROS     Musculoskeletal negative musculoskeletal ROS (+)   Abdominal   Peds  Hematology  (+) Blood dyscrasia, anemia ,   Anesthesia Other Findings   Reproductive/Obstetrics (+) Pregnancy                             Lab Results  Component Value Date   WBC 11.0 (H) 11/27/2018   HGB 12.6 11/27/2018   HCT 39.0 11/27/2018   MCV 89.0 11/27/2018   PLT 156 11/28/2018    Anesthesia Physical Anesthesia Plan  ASA: III  Anesthesia Plan: Epidural   Post-op Pain Management:    Induction:   PONV Risk Score and Plan: Treatment may vary due to age or medical condition  Airway Management Planned:   Additional Equipment:   Intra-op Plan:   Post-operative Plan:   Informed Consent: I have reviewed the patients History and Physical, chart, labs and discussed the procedure including the risks, benefits and alternatives for the proposed anesthesia with the patient or authorized representative who has indicated his/her understanding and acceptance.       Plan Discussed with:   Anesthesia Plan Comments:         Anesthesia Quick Evaluation

## 2018-11-28 NOTE — Progress Notes (Signed)
Setup [name]

## 2018-11-28 NOTE — Progress Notes (Signed)
Set up by Cristine Polio

## 2018-11-28 NOTE — Plan of Care (Signed)
  Problem: Education: Goal: Knowledge of Childbirth will improve Outcome: Progressing Goal: Ability to make informed decisions regarding treatment and plan of care will improve Outcome: Progressing Goal: Ability to state and carry out methods to decrease the pain will improve Outcome: Progressing Goal: Individualized Educational Video(s) Outcome: Progressing   Problem: Coping: Goal: Ability to verbalize concerns and feelings about labor and delivery will improve Outcome: Progressing   Problem: Life Cycle: Goal: Ability to make normal progression through stages of labor will improve Outcome: Progressing Goal: Ability to effectively push during vaginal delivery will improve Outcome: Progressing   Problem: Role Relationship: Goal: Will demonstrate positive interactions with the child Outcome: Progressing   Problem: Safety: Goal: Risk of complications during labor and delivery will decrease Outcome: Progressing   Problem: Pain Management: Goal: Relief or control of pain from uterine contractions will improve Outcome: Progressing   Problem: Education: Goal: Knowledge of General Education information will improve Description: Including pain rating scale, medication(s)/side effects and non-pharmacologic comfort measures Outcome: Progressing   Problem: Health Behavior/Discharge Planning: Goal: Ability to manage health-related needs will improve Outcome: Progressing   Problem: Clinical Measurements: Goal: Ability to maintain clinical measurements within normal limits will improve Outcome: Progressing Goal: Will remain free from infection Outcome: Progressing Goal: Diagnostic test results will improve Outcome: Progressing Goal: Respiratory complications will improve Outcome: Progressing Goal: Cardiovascular complication will be avoided Outcome: Progressing   Problem: Activity: Goal: Risk for activity intolerance will decrease Outcome: Progressing   Problem:  Nutrition: Goal: Adequate nutrition will be maintained Outcome: Progressing   Problem: Coping: Goal: Level of anxiety will decrease Outcome: Progressing   Problem: Elimination: Goal: Will not experience complications related to bowel motility Outcome: Progressing Goal: Will not experience complications related to urinary retention Outcome: Progressing   Problem: Pain Managment: Goal: General experience of comfort will improve Outcome: Progressing   Problem: Safety: Goal: Ability to remain free from injury will improve Outcome: Progressing   Problem: Skin Integrity: Goal: Risk for impaired skin integrity will decrease Outcome: Progressing   Problem: Education: Goal: Knowledge of disease or condition will improve Outcome: Progressing Goal: Knowledge of the prescribed therapeutic regimen will improve Outcome: Progressing   Problem: Fluid Volume: Goal: Peripheral tissue perfusion will improve Outcome: Progressing   Problem: Clinical Measurements: Goal: Complications related to disease process, condition or treatment will be avoided or minimized Outcome: Progressing   

## 2018-11-28 NOTE — Progress Notes (Signed)
LABOR PROGRESS NOTE  [name] is a 33 y.o. G2P0010 at [redacted]w[redacted]d  admitted for IOL 2/2 preE w/o SF.   Subjective: She is doing well sitting in bed resting. She is in minimal discomfort with contractions.  Objective: BP (!) 131/91   Pulse 62   Temp 98.3 F (36.8 C) (Oral)   Resp 12   LMP 02/26/2018 (Exact Date)   SpO2 100%  or  Vitals:   11/28/18 0601 11/28/18 0631 11/28/18 0704 11/28/18 0730  BP: 129/88 131/83 120/89 (!) 131/91  Pulse: (!) 58 (!) 57 70 62  Resp:  12    Temp:      TempSrc:      SpO2:         Dilation: 3.5 Effacement (%): 70 Station: -2 Presentation: Vertex Exam by:: s seagraves rn  FHT: baseline rate 160 bpm, moderate varibility, 10 x 10 acel, decel not well visualized due to intermittent tracing reception. Toco: 3-4 mins  Labs: Lab Results  Component Value Date   WBC 11.0 (H) 11/27/2018   HGB 12.6 11/27/2018   HCT 39.0 11/27/2018   MCV 89.0 11/27/2018   PLT 176 11/27/2018    Patient Active Problem List   Diagnosis Date Noted  . Preeclampsia, third trimester 11/27/2018  . GBS bacteriuria 10/23/2018  . Yeast vaginitis 10/09/2018  . Anemia of mother in pregnancy, antepartum 06/04/2018  . Supervision of other normal pregnancy, antepartum 05/06/2018  . Fibroid 04/02/2016  . Endometriosis 04/02/2016  . H/O myomectomy 12/12/2012    Assessment / Plan: 33 y.o. G2P0010 at [redacted]w[redacted]d here for IOL 2/2 preE w/o SF.  Labor: Continue cervical exams. Continue pitocin. Fetal Wellbeing:  Cat II Pain Control:  Maternally supported, planning for an epidural Anticipated MOD:  Vaginal  Berneice Zettlemoyer Autry-Lott, D.O. Family Medicine Resident, PGY-1 11/28/2018, 11:28 AM

## 2018-11-29 ENCOUNTER — Encounter (HOSPITAL_COMMUNITY): Payer: Self-pay

## 2018-11-29 DIAGNOSIS — O1404 Mild to moderate pre-eclampsia, complicating childbirth: Secondary | ICD-10-CM

## 2018-11-29 DIAGNOSIS — O99824 Streptococcus B carrier state complicating childbirth: Secondary | ICD-10-CM

## 2018-11-29 DIAGNOSIS — Z3A39 39 weeks gestation of pregnancy: Secondary | ICD-10-CM

## 2018-11-29 MED ORDER — DIPHENHYDRAMINE HCL 25 MG PO CAPS: 25.0000 mg | ORAL_CAPSULE | Freq: Four times a day (QID) | ORAL | Status: DC | PRN

## 2018-11-29 MED ORDER — PRENATAL MULTIVITAMIN CH
1.0000 | ORAL_TABLET | Freq: Every day | ORAL | Status: DC
  Administered 2018-11-29 – 2018-12-01 (×3): 1 via ORAL
  Filled 2018-11-29 (×3): qty 1

## 2018-11-29 MED ORDER — COCONUT OIL OIL: 1.0000 "application " | TOPICAL_OIL | Status: DC | PRN

## 2018-11-29 MED ORDER — ONDANSETRON HCL 4 MG/2ML IJ SOLN: 4.0000 mg | INTRAMUSCULAR | Status: DC | PRN

## 2018-11-29 MED ORDER — TETANUS-DIPHTH-ACELL PERTUSSIS 5-2.5-18.5 LF-MCG/0.5 IM SUSP: 0.5000 mL | Freq: Once | INTRAMUSCULAR | Status: DC

## 2018-11-29 MED ORDER — SIMETHICONE 80 MG PO CHEW: 80.0000 mg | CHEWABLE_TABLET | ORAL | Status: DC | PRN

## 2018-11-29 MED ORDER — DOCUSATE SODIUM 100 MG PO CAPS
100.0000 mg | ORAL_CAPSULE | Freq: Two times a day (BID) | ORAL | Status: DC
  Administered 2018-11-29 – 2018-12-01 (×4): 100 mg via ORAL
  Filled 2018-11-29 (×4): qty 1

## 2018-11-29 MED ORDER — IBUPROFEN 800 MG PO TABS
800.0000 mg | ORAL_TABLET | Freq: Three times a day (TID) | ORAL | Status: DC
  Administered 2018-11-29 – 2018-12-01 (×7): 800 mg via ORAL
  Filled 2018-11-29 (×7): qty 1

## 2018-11-29 MED ORDER — WITCH HAZEL-GLYCERIN EX PADS: 1.0000 "application " | MEDICATED_PAD | CUTANEOUS | Status: DC | PRN

## 2018-11-29 MED ORDER — ONDANSETRON HCL 4 MG PO TABS: 4.0000 mg | ORAL_TABLET | ORAL | Status: DC | PRN

## 2018-11-29 MED ORDER — ACETAMINOPHEN 325 MG PO TABS
650.0000 mg | ORAL_TABLET | ORAL | Status: DC | PRN
  Administered 2018-11-29: 650 mg via ORAL

## 2018-11-29 MED ORDER — MEASLES, MUMPS & RUBELLA VAC IJ SOLR: 0.5000 mL | Freq: Once | INTRAMUSCULAR | Status: DC

## 2018-11-29 MED ORDER — BENZOCAINE-MENTHOL 20-0.5 % EX AERO
1.0000 "application " | INHALATION_SPRAY | CUTANEOUS | Status: DC | PRN
  Filled 2018-11-29: qty 56

## 2018-11-29 MED ORDER — DIBUCAINE (PERIANAL) 1 % EX OINT: 1.0000 "application " | TOPICAL_OINTMENT | CUTANEOUS | Status: DC | PRN

## 2018-11-29 NOTE — Anesthesia Postprocedure Evaluation (Signed)
Anesthesia Post Note  Patient: [name]  Procedure(s) Performed: AN AD [place]     Patient location during evaluation: Mother Baby Anesthesia Type: Epidural Level of consciousness: awake and alert, oriented and patient cooperative Pain management: pain level controlled Vital Signs Assessment: post-procedure vital signs reviewed and stable Respiratory status: spontaneous breathing Cardiovascular status: stable Postop Assessment: no headache, epidural receding, patient able to bend at knees and no signs of nausea or vomiting Anesthetic complications: no Comments: Pt.  Interviewed via phone consultation d/t COVID 19 precautions.  Pt. States she is walking.  States pain score is 0.      Last Vitals:  Vitals:   11/29/18 0400 11/29/18 0517  BP: 114/81 122/86  Pulse: 79 84  Resp: 18 18  Temp: 36.8 C 36.7 C  SpO2: 98% 99%    Last Pain:  Vitals:   11/29/18 0730  TempSrc:   PainSc: 0-No pain   Pain Goal:                   [facility]

## 2018-11-29 NOTE — Progress Notes (Signed)
OB/GYN Faculty Practice: Labor Progress Note  Subjective: Doing well, feeling a bit more pressure. Plan of care discussed with RN several times since start of shift - having some prolonged decelerations since complete. No headaches, blurry vision.  Objective: BP 132/84   Pulse 61   Temp 98.4 F (36.9 C) (Oral)   Resp 19   LMP 02/26/2018 (Exact Date)   SpO2 98%  Gen: tired-appearing, NAD Dilation: 10 Dilation Complete Date: 11/28/18 Dilation Complete Time: 2235 Effacement (%): 100 Cervical Position: Anterior Station: -1 Presentation: Vertex Exam by:: Jerrell Mylar, RN  Assessment and Plan: 33 y.o. G2P0010 [redacted]w[redacted]d here for IOL for preeclampsia without severe features.  Labor: Induction started with FB followed by pitocin. SROM earlier this afternoon, IUPC placed to help titrate pitocin. IUPC has not been reading well, high baseline, but continuing to progress. Now complete, -1 to 0 station. Will start pushing.  -- pain control: epidural -- PPH Risk: medium  Preeclampsia without Severe Features: Asymptomatic. Normal to moderate range BP. UPC 1.05 on admission, Cr 0.92, AST/ALT wnl, Plt 156. -- continue to monitor closely   Fetal Well-Being: EFW 8lbs by [place]. Cephalic by sutures on prior checks.  -- Category II - continuous fetal monitoring - excellent variability in between, will start pushing -- GBS positive - PCN   Emily Sanna S. Juleen China, DO OB/GYN Fellow, Faculty Practice  1:02 AM

## 2018-11-29 NOTE — Lactation Note (Signed)
This note was copied from a baby's chart. Lactation Consultation Note  Patient Name: [name] IDPOE'U Date: 11/29/2018 Reason for consult: Primapara;1st time breastfeeding;Term Baby is 45 hours old, HNV, has stooled x 5 in life and has been to the breast  X 2 15 - 27 mins and attempts.  LC encouraged mom to feed with feeding cues and by 3 hours due to ( + ) DAT and  OA incompatibility.  Baby stirring and waking up. LC offered to check the baby's diaper  And noted a large mec - brown black stool and changed it.  LC assisted mom to latch on the right breast /. Cross cradle and It took several attempts to latch with depth due to baby not opening  Her mouth at 1st. Baby finally opened up and latched with depth, per mom  Comfortable and swallows noted/ increased with breast compressions.  Baby released at 17 mins, nipple well rounded.  Baby satisfied.  LC instructed mom on the use of shells between feedings except when sleeping or  10 mins prior to feeding.  To enhance compressibility of the the areolas and elongate the nipple, also due to  The baby not opening wide.  Improved with the latch when the LC eased down on the the chin and with firm support.   Per mom doesn't have a pump at home, will need a hand pump prior to D/C, and has contacted  Her insurance company( has to decide where, and what kind of DEBP ).   Mom receptive to breast feeding assistance and due to difficult will probably need assistance to latch.      Maternal Data Has patient been taught Hand Expression?: Yes Does the patient have breastfeeding experience prior to this delivery?: No  Feeding Feeding Type: Breast Fed  LATCH Score Latch: Repeated attempts needed to sustain latch, nipple held in mouth throughout feeding, stimulation needed to elicit sucking reflex.  Audible Swallowing: Spontaneous and intermittent  Type of Nipple: Everted at rest and after stimulation  Comfort (Breast/Nipple): Soft  / non-tender  Hold (Positioning): Assistance needed to correctly position infant at breast and maintain latch.  LATCH Score: 8  Interventions Interventions: Breast feeding basics reviewed;Assisted with latch;Skin to skin;Breast massage;Hand express;Breast compression;Adjust position;Support pillows;Position options  Lactation Tools Discussed/Used WIC Program: No   Consult Status Consult Status: Follow-up Date: 11/30/18 Follow-up type: In-patient    [place] 11/29/2018, 3:20 PM

## 2018-11-29 NOTE — Plan of Care (Signed)
  Problem: Education: Goal: Knowledge of General Education information will improve Description: Including pain rating scale, medication(s)/side effects and non-pharmacologic comfort measures 11/29/2018 0311 by Laruth Bouchard, RN Outcome: Progressing 11/28/2018 1944 by Laruth Bouchard, RN Outcome: Progressing   Problem: Health Behavior/Discharge Planning: Goal: Ability to manage health-related needs will improve 11/29/2018 0311 by Laruth Bouchard, RN Outcome: Progressing 11/28/2018 1944 by Laruth Bouchard, RN Outcome: Progressing   Problem: Clinical Measurements: Goal: Ability to maintain clinical measurements within normal limits will improve 11/29/2018 0311 by Laruth Bouchard, RN Outcome: Progressing 11/28/2018 1944 by Laruth Bouchard, RN Outcome: Progressing Goal: Will remain free from infection 11/29/2018 0311 by Laruth Bouchard, RN Outcome: Progressing 11/28/2018 1944 by Laruth Bouchard, RN Outcome: Progressing Goal: Diagnostic test results will improve 11/29/2018 0311 by Laruth Bouchard, RN Outcome: Progressing 11/28/2018 1944 by Laruth Bouchard, RN Outcome: Progressing Goal: Respiratory complications will improve 11/29/2018 0311 by Laruth Bouchard, RN Outcome: Progressing 11/28/2018 1944 by Laruth Bouchard, RN Outcome: Progressing Goal: Cardiovascular complication will be avoided 11/29/2018 0311 by Laruth Bouchard, RN Outcome: Progressing 11/28/2018 1944 by Laruth Bouchard, RN Outcome: Progressing   Problem: Activity: Goal: Risk for activity intolerance will decrease 11/29/2018 0311 by Laruth Bouchard, RN Outcome: Progressing 11/28/2018 1944 by Laruth Bouchard, RN Outcome: Progressing   Problem: Nutrition: Goal: Adequate nutrition will be maintained 11/29/2018 0311 by Laruth Bouchard, RN Outcome: Progressing 11/28/2018 1944 by Laruth Bouchard, RN Outcome:  Progressing   Problem: Coping: Goal: Level of anxiety will decrease 11/29/2018 0311 by Laruth Bouchard, RN Outcome: Progressing 11/28/2018 1944 by Laruth Bouchard, RN Outcome: Progressing   Problem: Elimination: Goal: Will not experience complications related to bowel motility 11/29/2018 0311 by Laruth Bouchard, RN Outcome: Progressing 11/28/2018 1944 by Laruth Bouchard, RN Outcome: Progressing Goal: Will not experience complications related to urinary retention 11/29/2018 0311 by Laruth Bouchard, RN Outcome: Progressing 11/28/2018 1944 by Laruth Bouchard, RN Outcome: Progressing   Problem: Pain Managment: Goal: General experience of comfort will improve 11/29/2018 0311 by Laruth Bouchard, RN Outcome: Progressing 11/28/2018 1944 by Laruth Bouchard, RN Outcome: Progressing   Problem: Safety: Goal: Ability to remain free from injury will improve 11/29/2018 0311 by Laruth Bouchard, RN Outcome: Progressing 11/28/2018 1944 by Laruth Bouchard, RN Outcome: Progressing   Problem: Skin Integrity: Goal: Risk for impaired skin integrity will decrease 11/29/2018 0311 by Laruth Bouchard, RN Outcome: Progressing 11/28/2018 1944 by Laruth Bouchard, RN Outcome: Progressing   Problem: Education: Goal: Knowledge of disease or condition will improve 11/29/2018 0311 by Laruth Bouchard, RN Outcome: Progressing 11/28/2018 1944 by Laruth Bouchard, RN Outcome: Progressing Goal: Knowledge of the prescribed therapeutic regimen will improve 11/29/2018 0311 by Laruth Bouchard, RN Outcome: Progressing 11/28/2018 1944 by Laruth Bouchard, RN Outcome: Progressing   Problem: Fluid Volume: Goal: Peripheral tissue perfusion will improve 11/29/2018 0311 by Laruth Bouchard, RN Outcome: Progressing 11/28/2018 1944 by Laruth Bouchard, RN Outcome: Progressing   Problem: Clinical Measurements: Goal:  Complications related to disease process, condition or treatment will be avoided or minimized 11/29/2018 0311 by Laruth Bouchard, RN Outcome: Progressing 11/28/2018 1944 by Laruth Bouchard, RN Outcome: Progressing

## 2018-11-29 NOTE — Discharge Summary (Signed)
Obstetrics Discharge Summary OB/GYN Faculty Practice   Patient Name: [name] DOB: [birth year] MRN: [id number]  Date of admission: 11/27/2018 Delivering MD: Glenice Bow   Date of discharge: 12/01/2018  Admitting diagnosis: 106WKS ELEV BP Intrauterine pregnancy: [redacted]w[redacted]d     Secondary diagnosis:   Principal Problem:   Preeclampsia, third trimester Active Problems:   H/O myomectomy   Supervision of other normal pregnancy, antepartum   GBS bacteriuria   NSVD (normal spontaneous vaginal delivery)    Discharge diagnosis: Term Pregnancy Delivered                                            Postpartum procedures: None  Complications: None  Outpatient Follow-Up: [ ]  BP check [ ]  IUD placement  Hospital course: [name] is a 33 y.o. [redacted]w[redacted]d who was admitted for induction of labor for preeclampsia without severe features. Her pregnancy was complicated by above noted. Her labor course was notable for induction with FB, pitocin, SROM, epidural placement. Delivery was complicated by 1st degree perineal laceration and periurethral lacerations. Please see delivery/op note for additional details. Her postpartum course was uncomplicated. Her BP was well-controlled throughout admission, and she denied headaches, vision changes, RUQ pain or SOB on day of discharge. She was breastfeeding without difficulty. By day of discharge, she was passing flatus, urinating, eating and drinking without difficulty. Her pain was well-controlled, and she was discharged home with ibuprofen. She will follow-up in clinic in 4-6 weeks.   Physical exam  Vitals:   11/30/18 0543 11/30/18 1500 11/30/18 2147 12/01/18 0610  BP: 118/82 123/86 130/86 (!) 134/93  Pulse: 89 89 95 60  Resp: 18 18 20 18   Temp: 98.2 F (36.8 C) 97.8 F (36.6 C) 98.2 F (36.8 C) 97.8 F (36.6 C)  TempSrc: Oral Oral Oral Oral  SpO2: 100%  100% 100%   General: Well appearing, no distress Lochia: appropriate Uterine Fundus:  firm Incision: N/A DVT Evaluation: No evidence of DVT seen on physical exam. Negative Homan's sign. No cords or calf tenderness. No significant calf/ankle edema.  Labs: Lab Results  Component Value Date   WBC 11.0 (H) 11/27/2018   HGB 12.6 11/27/2018   HCT 39.0 11/27/2018   MCV 89.0 11/27/2018   PLT 156 11/28/2018   CMP Latest Ref Rng & Units 11/27/2018  Glucose 70 - 99 mg/dL 75  BUN 6 - 20 mg/dL <5(L)  Creatinine 0.44 - 1.00 mg/dL 0.92  Sodium 135 - 145 mmol/L 136  Potassium 3.5 - 5.1 mmol/L 3.9  Chloride 98 - 111 mmol/L 104  CO2 22 - 32 mmol/L 21(L)  Calcium 8.9 - 10.3 mg/dL 9.4  Total Protein 6.5 - 8.1 g/dL 7.3  Total Bilirubin 0.3 - 1.2 mg/dL 0.6  Alkaline Phos 38 - 126 U/L 190(H)  AST 15 - 41 U/L 21  ALT 0 - 44 U/L 14    Discharge instructions: Per After Visit Summary and "Baby and Me Booklet"  After visit meds:  Allergies as of 12/01/2018   No Known Allergies     Medication List    STOP taking these medications   [place] these medications   acetaminophen 160 MG/5ML elixir Commonly known as: TYLENOL Take 15 mg/kg by mouth every 4 (four) hours as needed for fever.   ibuprofen 800 MG tablet Commonly known  as: ADVIL Take 1 tablet (800 mg total) by mouth 3 (three) times daily.   prenatal multivitamin Tabs tablet Take 1 tablet by mouth daily at 12 noon.       Postpartum contraception: IUD Liletta Diet: Routine Diet Activity: Advance as tolerated. Pelvic rest for 6 weeks.   Follow-up Appt: Future Appointments  Date Time Provider [place]  01/12/2019 10:00 AM Lavonia Drafts, MD [facility] None   Follow-up Visit:No follow-ups on file.  Please schedule this patient for Postpartum visit in: 4-6 weeks with the following provider: Any provider High risk pregnancy complicated by: preE w/o SF Delivery mode:  SVD Anticipated Birth Control:  IUD PP Procedures needed: BP check  Schedule Integrated [place] visit:  no  Newborn Data: Live born female  Birth Weight: 6 lb 15.1 oz (3150 g) APGAR: 8, 9  Newborn Delivery   Birth date/time: 11/29/2018 01:48:00 Delivery type: Vaginal, Spontaneous      Baby Feeding: Breast Disposition:home with mother

## 2018-11-30 NOTE — Progress Notes (Signed)
POSTPARTUM PROGRESS NOTE  Post Partum Day 1  Subjective:  [name] is a 33 y.o. G2P1011 s/p SVD at [redacted]w[redacted]d.  She reports she is doing well. No acute events overnight. She denies any problems with ambulating, voiding or po intake. Denies nausea or vomiting.  Pain is well controlled.  Lochia is mild.  Objective: Blood pressure 118/82, pulse 89, temperature 98.2 F (36.8 C), temperature source Oral, resp. rate 18, last menstrual period 02/26/2018, SpO2 100 %, unknown if currently breastfeeding.  Physical Exam:  General: alert, cooperative and no distress Chest: no respiratory distress Heart:regular rate, distal pulses intact Abdomen: soft, nontender,  Uterine Fundus: firm, appropriately tender DVT Evaluation: No calf swelling or tenderness Extremities: no edema Skin: warm, dry  Recent Labs    11/27/18 1536  HGB 12.6  HCT 39.0    Assessment/Plan: [name] is a 33 y.o. G2P1011 s/p SVD at [redacted]w[redacted]d   PPD#1 - Doing well  Routine postpartum care  Contraception: IUD Feeding: Breastfeeding  Dispo: Plan for discharge tomorrow.   LOS: 3 days   Darrelyn Hillock, D.O. Family Medicine PGY-2   11/30/2018, 8:38 AM

## 2018-11-30 NOTE — Lactation Note (Signed)
This note was copied from a baby's chart. Lactation Consultation Note  Patient Name: [name] HYIFO'Y Date: 11/30/2018 Reason for consult: Follow-up assessment;1st time breastfeeding;Primapara;Term;Infant weight loss(6 % weight loss) As LC entered the room baby asleep and per mom recently finished feeding the baby. Per mom the  breast shells are really helping, also hearing swallows.  Baby was dressed in an sleeper. LC stressed STS feedings especially since her breast are getting fuller and heavier.  Teachable moment - reviewed sore nipple and engorgement prevention and tx, importance of the shells helping with soreness prevention, nutritive vs non - nutritive feeding patterns and the importance of watching the baby for hanging out latched, storage of breast milk.  LC instructed mom on the use of hand pump and provided the #27 F when her milk comes in due to areola edema.   Mom aware of the [facility] resources post D/C with [facility].  [place] praised mom for her breast feeding efforts.    Maternal Data Has patient been taught Hand Expression?: Yes  Feeding Feeding Type: (per mom the baby recently fed)  LATCH Score                   Interventions Interventions: Breast feeding basics reviewed;Hand pump;Shells  Lactation Tools Discussed/Used Tools: Shells;Pump;Flanges(per mom the shells are really working well) Flange Size: 24;27 Shell Type: Inverted Breast pump type: Manual Pump Review: Setup, frequency, and cleaning;Milk Storage Initiated by:: MAI Date initiated:: 11/30/18   Consult Status Consult Status: Follow-up Date: 12/01/18 Follow-up type: In-patient    [place] 11/30/2018, 3:44 PM

## 2018-12-01 MED ORDER — IBUPROFEN 800 MG PO TABS: 800.0000 mg | ORAL_TABLET | Freq: Three times a day (TID) | ORAL | 0 refills | Status: DC

## 2018-12-01 NOTE — Lactation Note (Signed)
This note was copied from a baby's chart. Lactation Consultation Note  Patient Name: Girl Thessaly Mccullers IWPYK'D Date: 12/01/2018 Reason for consult: Initial assessment;Primapara;Term Baby is 54 hours old/8% weight loss.  Mom states that baby is latching easily and feeding well.  Baby cluster fed last night.  Discussed milk coming to volume and the prevention and treatment of engorgement.  No questions or concerns.  Mom states she has had sufficient breastfeeding assist.  Reviewed lactation outpatient services and encouraged to call prn.  Maternal Data    Feeding Feeding Type: Breast Fed  LATCH Score                   Interventions    Lactation Tools Discussed/Used     Consult Status Consult Status: Complete Follow-up type: Call as needed    [place] Filter 12/01/2018, 8:23 AM

## 2018-12-02 ENCOUNTER — Encounter: Payer: 59 | Admitting: Advanced Practice Midwife

## 2018-12-09 ENCOUNTER — Other Ambulatory Visit: Payer: Self-pay

## 2018-12-09 ENCOUNTER — Ambulatory Visit: Payer: 59

## 2018-12-09 VITALS — BP 150/92 | HR 62 | Wt 197.0 lb

## 2018-12-09 DIAGNOSIS — Z0130 Encounter for examination of blood pressure without abnormal findings: Secondary | ICD-10-CM

## 2018-12-09 NOTE — Progress Notes (Signed)
Pt presents to the office for BP check. BP was 156/96 and 150/92. Pt denies dizziness, blurred vision, and headaches. Pt advised to check BP at home tomorrow and to send readings via Mychart. Pt is also scheduled for f/u BP check next week. chiquita l wilson, CMA

## 2018-12-10 ENCOUNTER — Telehealth: Payer: Self-pay | Admitting: Obstetrics & Gynecology

## 2018-12-10 DIAGNOSIS — O135 Gestational [pregnancy-induced] hypertension without significant proteinuria, complicating the puerperium: Secondary | ICD-10-CM

## 2018-12-10 MED ORDER — ENALAPRIL MALEATE 5 MG PO TABS: 2.5000 mg | ORAL_TABLET | Freq: Every day | ORAL | 1 refills | Status: DC

## 2018-12-10 NOTE — Telephone Encounter (Signed)
Patient called and made aware that Dr. Ihor Dow has instructed that she start a BP medication. Patient states understanding.Patient will keep BP check for next week as well.  Kathrene Alu RN

## 2018-12-10 NOTE — Telephone Encounter (Signed)
TC to pt ti inform of need for BP meds with current BP. No answer left message to call back.   Rosalene Wardrop L. Harraway-Smith, M.D., Cherlynn June

## 2018-12-15 ENCOUNTER — Ambulatory Visit: Payer: 59

## 2018-12-15 ENCOUNTER — Other Ambulatory Visit: Payer: Self-pay

## 2018-12-15 VITALS — BP 131/88 | HR 84 | Ht 63.0 in | Wt 185.0 lb

## 2018-12-15 DIAGNOSIS — Z0130 Encounter for examination of blood pressure without abnormal findings: Secondary | ICD-10-CM

## 2018-12-15 NOTE — Progress Notes (Signed)
Chart reviewed - agree with CMA documentation.

## 2018-12-15 NOTE — Progress Notes (Signed)
Pt presents for BP check. BP reading was 131/88. Pt advised to continue Enalapril 5 mg PO daily and to f/u up at [facility] appt or sooner if needed. Understanding was voiced.  [name] l Tyheim Vanalstyne, CMA

## 2019-01-12 ENCOUNTER — Ambulatory Visit (INDEPENDENT_AMBULATORY_CARE_PROVIDER_SITE_OTHER): Payer: 59 | Admitting: Obstetrics & Gynecology

## 2019-01-12 ENCOUNTER — Encounter: Payer: Self-pay | Admitting: Obstetrics & Gynecology

## 2019-01-12 ENCOUNTER — Other Ambulatory Visit: Payer: Self-pay

## 2019-01-12 VITALS — BP 126/90 | Ht 63.0 in | Wt 188.0 lb

## 2019-01-12 DIAGNOSIS — O135 Gestational [pregnancy-induced] hypertension without significant proteinuria, complicating the puerperium: Secondary | ICD-10-CM

## 2019-01-12 DIAGNOSIS — Z30014 Encounter for initial prescription of intrauterine contraceptive device: Secondary | ICD-10-CM

## 2019-01-12 DIAGNOSIS — Z30430 Encounter for insertion of intrauterine contraceptive device: Secondary | ICD-10-CM

## 2019-01-12 DIAGNOSIS — Z3202 Encounter for pregnancy test, result negative: Secondary | ICD-10-CM | POA: Diagnosis not present

## 2019-01-12 DIAGNOSIS — Z01812 Encounter for preprocedural laboratory examination: Secondary | ICD-10-CM

## 2019-01-12 LAB — POCT URINE PREGNANCY: Preg Test, Ur: NEGATIVE

## 2019-01-12 MED ORDER — LEVONORGESTREL 19.5 MCG/DAY IU IUD
INTRAUTERINE_SYSTEM | Freq: Once | INTRAUTERINE | Status: AC
  Administered 2019-01-12: 11:00:00 via INTRAUTERINE

## 2019-01-12 NOTE — Progress Notes (Signed)
Post Partum Exam  [name] is a 33 y.o. G77P1011 female who presents for a postpartum visit. She is 6 weeks postpartum following a spontaneous vaginal delivery. I have fully reviewed the prenatal and intrapartum course. The delivery was at  39.3 gestational weeks.  Anesthesia: none. Postpartum course has been uneventful. Baby's course has been uneventful. Baby is feeding by breast. Bleeding no bleeding. Bowel function is normal. Bladder function is normal. Patient is sexually active. Contraception method is IUD. 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    Objective:  unknown if currently breastfeeding.  BP 126/90   Ht 5\' 3"  (1.6 m)   Wt 188 lb (85.3 kg)   BMI 33.30 kg/m    CONSTITUTIONAL: Well-developed, well-nourished female in no acute distress.  HENT:  Normocephalic, atraumatic EYES: Conjunctivae and EOM are normal. No scleral icterus.  NECK: Normal range of motion SKIN: Skin is warm and dry. No rash noted. Not diaphoretic.No pallor. [place]: Alert and oriented to person, place, and time. Normal coordination.   GYNECOLOGY CLINIC PROCEDURE NOTE  [name] is a 33 y.o. G2P1011 here for Mirena IUD insertion. No GYN concerns.  Last pap smear was on 04/2019 and was normal but, with a +hrHPV.  IUD Insertion Procedure Note Patient identified, informed consent performed.  Discussed risks of irregular bleeding, cramping, infection, malpositioning or misplacement of the IUD outside the uterus which may require further procedures. Time out was performed.  Urine pregnancy test negative.  Speculum placed in the vagina.  Cervix visualized.  Cleaned with Betadine x 2.  Grasped anteriorly with a single tooth tenaculum.  Uterus sounded to 8 cm.  Mirena IUD placed per manufacturer's recommendations.  Strings trimmed to 3 cm. Tenaculum was removed, good hemostasis noted.  Patient  tolerated procedure well.    Assessment:    6weeks postpartum exam. Pap smear not done at today's visit.  PP state c/w Gestational HTN- pt took only one dose of medication. DBP still elevated.    Plan:   1. Contraception: IUD 2. Needs repeat PAP 04/2019 3. Follow up in: 4 weeks or as needed.  4.  Review post IUD instructions 5. Pt declines meds for BP. Rec control of diet and exercise. Reviewed with pt.   [place]ford Tamura L. Harraway-Smith, M.D., Cherlynn June

## 2019-01-12 NOTE — Patient Instructions (Signed)

## 2019-02-09 ENCOUNTER — Ambulatory Visit: Payer: 59 | Admitting: Obstetrics & Gynecology

## 2019-02-20 ENCOUNTER — Other Ambulatory Visit: Payer: Self-pay

## 2019-02-20 ENCOUNTER — Encounter: Payer: Self-pay | Admitting: Obstetrics & Gynecology

## 2019-02-20 ENCOUNTER — Ambulatory Visit (INDEPENDENT_AMBULATORY_CARE_PROVIDER_SITE_OTHER): Payer: 59 | Admitting: Obstetrics & Gynecology

## 2019-02-20 VITALS — BP 145/94 | HR 87 | Wt 193.0 lb

## 2019-02-20 DIAGNOSIS — I10 Essential (primary) hypertension: Secondary | ICD-10-CM | POA: Diagnosis not present

## 2019-02-20 DIAGNOSIS — O135 Gestational [pregnancy-induced] hypertension without significant proteinuria, complicating the puerperium: Secondary | ICD-10-CM

## 2019-02-20 MED ORDER — ENALAPRIL MALEATE 5 MG PO TABS: 5.0000 mg | ORAL_TABLET | Freq: Every day | ORAL | 3 refills | Status: DC

## 2019-02-20 NOTE — Patient Instructions (Signed)
Preventing Cerebrovascular Disease  Arteries are blood vessels that carry blood that contains oxygen from the heart to all parts of the body. Cerebrovascular disease affects arteries that supply the brain. Any condition that blocks or disrupts blood flow to the brain can cause cerebrovascular disease. Brain cells that lose blood supply start to die within minutes (stroke). Stroke is the main danger of cerebrovascular disease. Atherosclerosis and high blood pressure are common causes of cerebrovascular disease. Atherosclerosis is narrowing and hardening of an artery that results when fat, cholesterol, calcium, or other substances (plaque) build up inside an artery. Plaque reduces blood flow through the artery. High blood pressure increases the risk of bleeding inside the brain. Making diet and lifestyle changes to prevent atherosclerosis and high blood pressure lowers your risk of cerebrovascular disease. What nutrition changes can be made?  Eat more fruits, vegetables, and whole grains.  Reduce how much saturated fat you eat. To do this, eat less red meat and fewer full-fat dairy products.  Eat healthy proteins instead of red meat. Healthy proteins include: ? Fish. Eat fish that contains heart-healthy omega-3 fatty acids, twice a week. Examples include salmon, albacore tuna, mackerel, and herring. ? Chicken. ? Nuts. ? Low-fat or nonfat yogurt.  Avoid processed meats, like bacon and lunchmeat.  Avoid foods that contain: ? A lot of sugar, such as sweets and drinks with added sugar. ? A lot of salt (sodium). Avoid adding extra salt to your food, as told by your health care provider. ? Trans fats, such as margarine and baked goods. Trans fats may be listed as "partially hydrogenated oils" on food labels.  Check food labels to see how much sodium, sugar, and trans fats are in foods.  Use vegetable oils that contain low amounts of saturated fat, such as olive oil or canola oil. What lifestyle  changes can be made?  Drink alcohol in moderation. This means no more than 1 drink a day for nonpregnant women and 2 drinks a day for men. One drink equals 12 oz of beer, 5 oz of wine, or 1 oz of hard liquor.  If you are overweight, ask your health care provider to recommend a weight-loss plan for you. Losing 5-10 lb (2.2-4.5 kg) can reduce your risk of diabetes, atherosclerosis, and high blood pressure.  Exercise for 30?60 minutes on most days, or as much as told by your health care provider. ? Do moderate-intensity exercise, such as brisk walking, bicycling, and water aerobics. Ask your health care provider which activities are safe for you.   Do not use any products that contain nicotine or tobacco, such as cigarettes and e-cigarettes. If you need help quitting, ask your health care provider. Why are these changes important? Making these changes lowers your risk of many diseases that can cause cerebrovascular disease and stroke. Stroke is a leading cause of death and disability. Making these changes also improves your overall health and quality of life. What can I do to lower my risk? The following factors make you more likely to develop cerebrovascular disease:  Being overweight.  Smoking.  Being physically inactive.  Eating a high-fat diet.  Having certain health conditions, such as: ? Diabetes. ? High blood pressure. ? Heart disease. ? Atherosclerosis. ? High cholesterol. ? Sickle cell disease.  Talk with your health care provider about your risk for cerebrovascular disease. Work with your health care provider to control diseases that you have that may contribute to cerebrovascular disease. Your health care provider may prescribe medicines to  help prevent major causes of cerebrovascular disease. Where to find more information Learn more about preventing cerebrovascular disease from:  [place], Lung, and [place]:  [employer]de  Centers for Disease Control and Prevention: http://www.curry-wood.biz/ Summary  Cerebrovascular disease can lead to a stroke.  Atherosclerosis and high blood pressure are major causes of cerebrovascular disease.  Making diet and lifestyle changes can reduce your risk of cerebrovascular disease.  Work with your health care provider to get your risk factors under control to reduce your risk of cerebrovascular disease. This information is not intended to replace advice given to you by your health care provider. Make sure you discuss any questions you have with your health care provider. Document Released: 05/22/2015 Document Revised: 04/19/2017 Document Reviewed: 05/22/2015 Elsevier Patient Education  2020 [employer]. Hypertension, Adult High blood pressure (hypertension) is when the force of blood pumping through the arteries is too strong. The arteries are the blood vessels that carry blood from the heart throughout the body. Hypertension forces the heart to work harder to pump blood and may cause arteries to become narrow or stiff. Untreated or uncontrolled hypertension can cause a heart attack, heart failure, a stroke, kidney disease, and other problems. A blood pressure reading consists of a higher number over a lower number. Ideally, your blood pressure should be below 120/80. The first ("top") number is called the systolic pressure. It is a measure of the pressure in your arteries as your heart beats. The second ("bottom") number is called the diastolic pressure. It is a measure of the pressure in your arteries as the heart relaxes. What are the causes? The exact cause of this condition is not known. There are some conditions that result in or are related to high blood pressure. What increases the risk? Some risk factors for high blood pressure are under your control. The following factors may make you more likely to develop this  condition:  Smoking.  Having type 2 diabetes mellitus, high cholesterol, or both.  Not getting enough exercise or physical activity.  Being overweight.  Having too much fat, sugar, calories, or salt (sodium) in your diet.  Drinking too much alcohol. Some risk factors for high blood pressure may be difficult or impossible to change. Some of these factors include:  Having chronic kidney disease.  Having a family history of high blood pressure.  Age. Risk increases with age.  Race. You may be at higher risk if you are African American.  Gender. Men are at higher risk than women before age 28. After age 54, women are at higher risk than men.  Having obstructive sleep apnea.  Stress. What are the signs or symptoms? High blood pressure may not cause symptoms. Very high blood pressure (hypertensive crisis) may cause:  Headache.  Anxiety.  Shortness of breath.  Nosebleed.  Nausea and vomiting.  Vision changes.  Severe chest pain.  Seizures. How is this diagnosed? This condition is diagnosed by measuring your blood pressure while you are seated, with your arm resting on a flat surface, your legs uncrossed, and your feet flat on the floor. The cuff of the blood pressure monitor will be placed directly against the skin of your upper arm at the level of your heart. It should be measured at least twice using the same arm. Certain conditions can cause a difference in blood pressure between your right and left arms. Certain factors can cause blood pressure readings to be lower or higher than normal for a short period of time:  When your blood pressure is higher when you are in a health care provider's office than when you are at home, this is called white coat hypertension. Most people with this condition do not need medicines.  When your blood pressure is higher at home than when you are in a health care provider's office, this is called masked hypertension. Most people with  this condition may need medicines to control blood pressure. If you have a high blood pressure reading during one visit or you have normal blood pressure with other risk factors, you may be asked to:  Return on a different day to have your blood pressure checked again.  Monitor your blood pressure at home for 1 week or longer. If you are diagnosed with hypertension, you may have other blood or imaging tests to help your health care provider understand your overall risk for other conditions. How is this treated? This condition is treated by making healthy lifestyle changes, such as eating healthy foods, exercising more, and reducing your alcohol intake. Your health care provider may prescribe medicine if lifestyle changes are not enough to get your blood pressure under control, and if:  Your systolic blood pressure is above 130.  Your diastolic blood pressure is above 80. Your personal target blood pressure may vary depending on your medical conditions, your age, and other factors. Follow these instructions at home: Eating and drinking   Eat a diet that is high in fiber and potassium, and low in sodium, added sugar, and fat. An example eating plan is called the DASH (Dietary Approaches to Stop Hypertension) diet. To eat this way: ? Eat plenty of fresh fruits and vegetables. Try to fill one half of your plate at each meal with fruits and vegetables. ? Eat whole grains, such as whole-wheat pasta, brown rice, or whole-grain bread. Fill about one fourth of your plate with whole grains. ? Eat or drink low-fat dairy products, such as skim milk or low-fat yogurt. ? Avoid fatty cuts of meat, processed or cured meats, and poultry with skin. Fill about one fourth of your plate with lean proteins, such as fish, chicken without skin, beans, eggs, or tofu. ? Avoid pre-made and processed foods. These tend to be higher in sodium, added sugar, and fat.  Reduce your daily sodium intake. Most people with  hypertension should eat less than 1,500 mg of sodium a day.  Do not drink alcohol if: ? Your health care provider tells you not to drink. ? You are pregnant, may be pregnant, or are planning to become pregnant.  If you drink alcohol: ? Limit how much you use to:  0-1 drink a day for women.  0-2 drinks a day for men. ? Be aware of how much alcohol is in your drink. In the [place] one drink equals one 12 oz bottle of beer (355 mL), one 5 oz glass of wine (148 mL), or one 1 oz glass of hard liquor (44 mL). Lifestyle   Work with your health care provider to maintain a healthy body weight or to lose weight. Ask what an ideal weight is for you.  Get at least 30 minutes of exercise most days of the week. Activities may include walking, swimming, or biking.  Include exercise to strengthen your muscles (resistance exercise), such as Pilates or lifting weights, as part of your weekly exercise routine. Try to do these types of exercises for 30 minutes at least 3 days a week.  Do not use any products that contain  nicotine or tobacco, such as cigarettes, e-cigarettes, and chewing tobacco. If you need help quitting, ask your health care provider.  Monitor your blood pressure at home as told by your health care provider.  Keep all follow-up visits as told by your health care provider. This is important. Medicines  Take over-the-counter and prescription medicines only as told by your health care provider. Follow directions carefully. Blood pressure medicines must be taken as prescribed.  Do not skip doses of blood pressure medicine. Doing this puts you at risk for problems and can make the medicine less effective.  Ask your health care provider about side effects or reactions to medicines that you should watch for. Contact a health care provider if you:  Think you are having a reaction to a medicine you are taking.  Have headaches that keep coming back (recurring).  Feel dizzy.  Have  swelling in your ankles.  Have trouble with your vision. Get help right away if you:  Develop a severe headache or confusion.  Have unusual weakness or numbness.  Feel faint.  Have severe pain in your chest or abdomen.  Vomit repeatedly.  Have trouble breathing. Summary  Hypertension is when the force of blood pumping through your arteries is too strong. If this condition is not controlled, it may put you at risk for serious complications.  Your personal target blood pressure may vary depending on your medical conditions, your age, and other factors. For most people, a normal blood pressure is less than 120/80.  Hypertension is treated with lifestyle changes, medicines, or a combination of both. Lifestyle changes include losing weight, eating a healthy, low-sodium diet, exercising more, and limiting alcohol. This information is not intended to replace advice given to you by your health care provider. Make sure you discuss any questions you have with your health care provider. Document Released: 05/07/2005 Document Revised: 01/15/2018 Document Reviewed: 01/15/2018 Elsevier Patient Education  2020 [employer].

## 2019-02-20 NOTE — Progress Notes (Signed)
History:  33 y.o. [id number] here today for today for IUD string check; LnIUD was placed  01/12/2019. No complaints about the IUD, no concerning side effects.  The following portions of the patient's history were reviewed and updated as appropriate: allergies, current medications, past family history, past medical history, past social history, past surgical history and problem list. Last pap smear on 05/06/2018 was normal, pos HRHPV.  Review of Systems:  Pertinent items are noted in HPI.   Objective:  BP (!) 145/94   Pulse 87   Wt 193 lb (87.5 kg)   BMI 34.19 kg/m    CONSTITUTIONAL: Well-developed, well-nourished female in no acute distress.  HENT:  Normocephalic, atraumatic EYES: Conjunctivae and EOM are normal. No scleral icterus.  NECK: Normal range of motion SKIN: Skin is warm and dry. No rash noted. Not diaphoretic.No pallor. [place]: Alert and oriented to person, place, and time. Normal coordination.  GU: EGBUS: no lesions Vagina: no blood in vault Cervix: no lesion; no mucopurulent d/c; IUD strings noted 4 cm from the os    Assessment & Plan:  Chronic HTN IUD check   Normal IUD check. Patient to keep IUD in place for five years; can come in for removal if she desires pregnancy within the next five years. Vasotec 5 mg po q day  Routine preventative health maintenance measures emphasized. Needs repeat PAP 04/2019  Cordell Guercio L. Harraway-Smith, M.D., Cherlynn June

## 2019-02-20 NOTE — Progress Notes (Signed)
Patient presents for IUD string check. Kathrene Alu RN

## 2019-02-24 ENCOUNTER — Encounter: Payer: Self-pay | Admitting: Obstetrics & Gynecology

## 2019-05-06 ENCOUNTER — Ambulatory Visit: Payer: 59 | Admitting: Obstetrics & Gynecology

## 2019-06-03 ENCOUNTER — Ambulatory Visit: Payer: 59 | Admitting: Obstetrics & Gynecology

## 2019-06-08 ENCOUNTER — Other Ambulatory Visit: Payer: 59

## 2019-06-19 ENCOUNTER — Other Ambulatory Visit: Payer: Self-pay

## 2019-06-19 ENCOUNTER — Encounter: Payer: Self-pay | Admitting: Obstetrics & Gynecology

## 2019-06-19 ENCOUNTER — Ambulatory Visit (INDEPENDENT_AMBULATORY_CARE_PROVIDER_SITE_OTHER): Payer: 59 | Admitting: Obstetrics & Gynecology

## 2019-06-19 VITALS — BP 118/81 | HR 83 | Ht 63.0 in | Wt 198.0 lb

## 2019-06-19 DIAGNOSIS — Z124 Encounter for screening for malignant neoplasm of cervix: Secondary | ICD-10-CM | POA: Diagnosis not present

## 2019-06-19 DIAGNOSIS — Z01419 Encounter for gynecological examination (general) (routine) without abnormal findings: Secondary | ICD-10-CM | POA: Diagnosis not present

## 2019-06-19 DIAGNOSIS — Z1151 Encounter for screening for human papillomavirus (HPV): Secondary | ICD-10-CM | POA: Diagnosis not present

## 2019-06-19 DIAGNOSIS — O165 Unspecified maternal hypertension, complicating the puerperium: Secondary | ICD-10-CM

## 2019-06-19 DIAGNOSIS — R87810 Cervical high risk human papillomavirus (HPV) DNA test positive: Secondary | ICD-10-CM

## 2019-06-19 NOTE — Progress Notes (Signed)
Subjective:     [name] is a 34 y.o. female here for a routine exam. G2P1. Pt is having irreg menses due to [place]. She reports less pain than prior to the IUD. She remains on BP meds PP. Did not have HTN prior to pregnancy. Does not take meds daily due ot forgetting at times. She is breastfeeding exclusively.     Gynecologic History No LMP recorded. 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  Review of Systems Pertinent items are noted in HPI.    Objective:  BP 118/81   Pulse 83   Ht 5\' 3"  (1.6 m)   Wt 198 lb (89.8 kg)   BMI 35.07 kg/m   General Appearance:    Alert, cooperative, no distress, appears stated age  Head:    Normocephalic, without obvious abnormality, atraumatic  Eyes:    conjunctiva/corneas clear, EOM's intact, both eyes  Ears:    Normal external ear canals, both ears  Nose:   Nares normal, septum midline, mucosa normal, no drainage    or sinus tenderness  Throat:   Lips, mucosa, and tongue normal; teeth and gums normal  Neck:   Supple, symmetrical, trachea midline, no adenopathy;    thyroid:  no enlargement/tenderness/nodules  Back:     Symmetric, no curvature, ROM normal, no CVA tenderness  Lungs:     respirations unlabored  Chest Wall:    No tenderness or deformity   Heart:    Regular rate and rhythm  Breast Exam:    No tenderness, masses, or nipple abnormality  Abdomen:     Soft,  non-tender, bowel sounds active all four quadrants,    no masses, no organomegaly  Genitalia:    Normal female without lesion, discharge or tenderness: IUD strings noted     Extremities:   Extremities normal, atraumatic, no cyanosis or edema  Pulses:   2+ and symmetric all extremities  Skin:   Skin color, texture, turgor normal, no rashes or lesions    Assessment:    Healthy female exam.   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 She has been taking her BP at home intermittently and it has been low. She is not remembering to take her meds daily.   Plan:   F/u PAP with HPV\ F/u in 1 year or sooner prn  Will stop BP meds for now and recheck BPs every 2 weeks Nurse visit in 2 weeks to check BP (may be virtual)  Flornce Record L. Harraway-Smith, M.D., Cherlynn June

## 2019-06-19 NOTE — Addendum Note (Signed)
Addended by: Phill Myron on: 06/19/2019 09:12 AM   Modules accepted: Orders

## 2019-06-22 LAB — CYTOLOGY - PAP
Comment: NEGATIVE
Diagnosis: NEGATIVE
High risk HPV: NEGATIVE

## 2019-07-01 ENCOUNTER — Telehealth (INDEPENDENT_AMBULATORY_CARE_PROVIDER_SITE_OTHER): Payer: 59

## 2019-07-01 VITALS — BP 118/83

## 2019-07-01 DIAGNOSIS — Z0130 Encounter for examination of blood pressure without abnormal findings: Secondary | ICD-10-CM

## 2019-07-01 NOTE — Progress Notes (Signed)
Subjective:  [name] is a 34 y.o. female here for BP check.   Objective:  BP 118/83   Appearance alert, well appearing, and in no distress. General exam BP noted to be well controlled today in office.    Assessment:   Blood Pressure  Plan:  Follow up in 2 weeks for BP check   Attestation of Attending Supervision of CMA/RN: Evaluation and management procedures were performed by the nurse under my supervision and collaboration.  I have reviewed the nursing note and chart, and I agree with the management and plan.  Carolyn L. Harraway-Smith, M.D., Cherlynn June

## 2019-07-15 ENCOUNTER — Telehealth (INDEPENDENT_AMBULATORY_CARE_PROVIDER_SITE_OTHER): Payer: 59

## 2019-07-15 DIAGNOSIS — Z0130 Encounter for examination of blood pressure without abnormal findings: Secondary | ICD-10-CM

## 2019-07-15 NOTE — Progress Notes (Addendum)
Subjective:  [name] is a 34 y.o. female here for BP check. This visit was performed using the telephone. Patient id'd by name and dob.   Patient has been off blood pressure medication for over a month now.    Objective:  BP 124/80    General exam BP noted to be well controlled.    Assessment:   Blood Pressure stable.   Plan:  Patient will continue using at home blood pressure monitoring. Patient will monitor more frequently if she develops headaches, dizziness, and or increased overall not feeling well. Patient states understanding and will call back if any changes in her symptoms. Kathrene Alu RN   Attestation of Attending Supervision of RN: Evaluation and management procedures were performed by the nurse under my supervision and collaboration.  I have reviewed the nursing note and chart, and I agree with the management and plan.  Carolyn L. Harraway-Smith, M.D., Cherlynn June

## 2020-03-21 IMAGING — US US MFM OB FOLLOW UP
1 series · 13 of 28 positions shown · non-contrast
Comparison: none

[Series 1: us mfm ob follow up · 68 acquisitions, 13 frames shown]
[im 3/68]
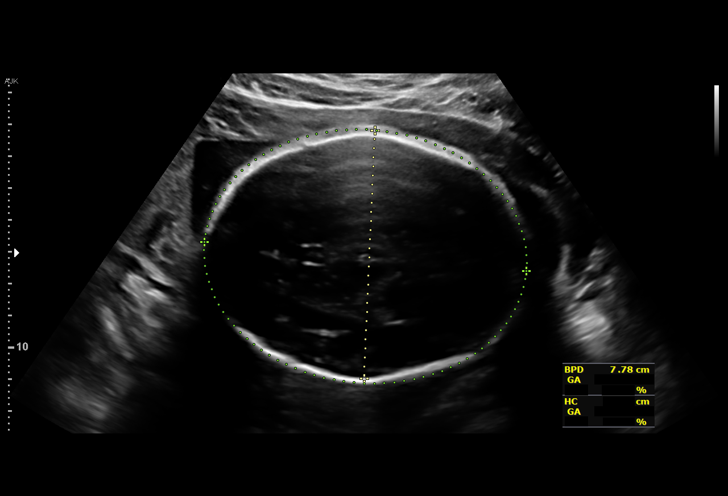
[im 8/68]
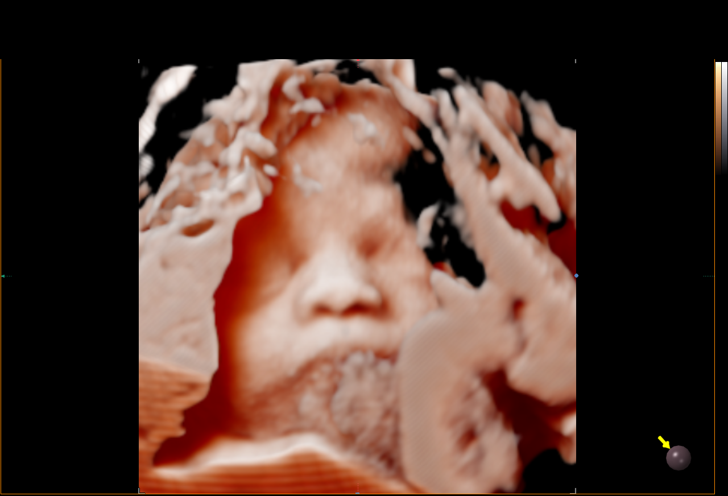
[im 13/68]
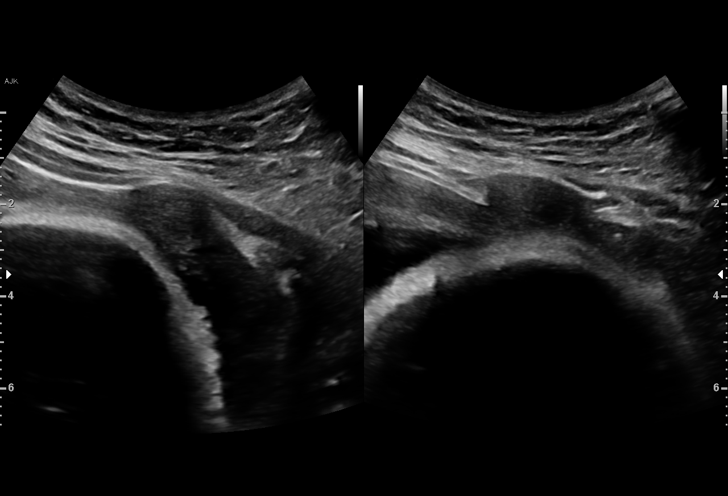
[im 18/68]
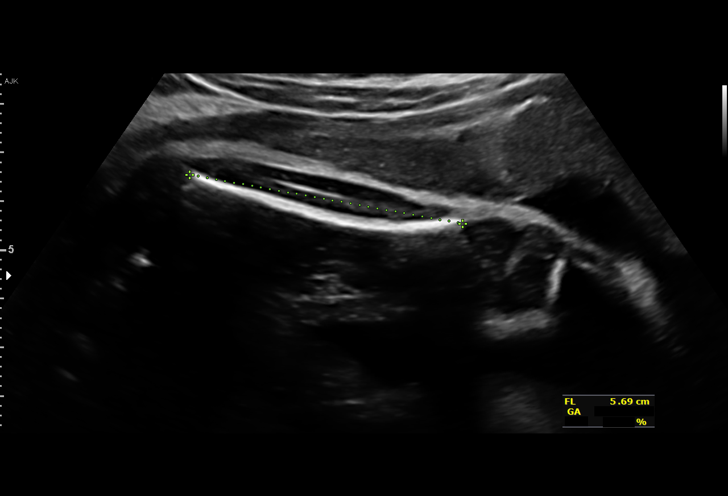
[im 23/68]
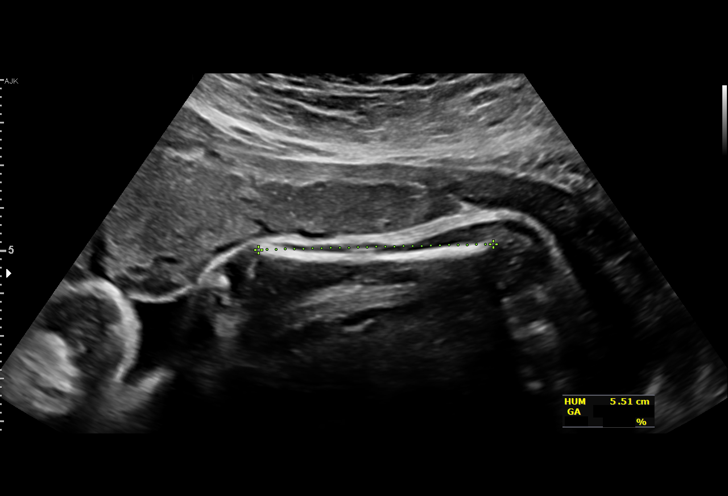
[im 28/68]
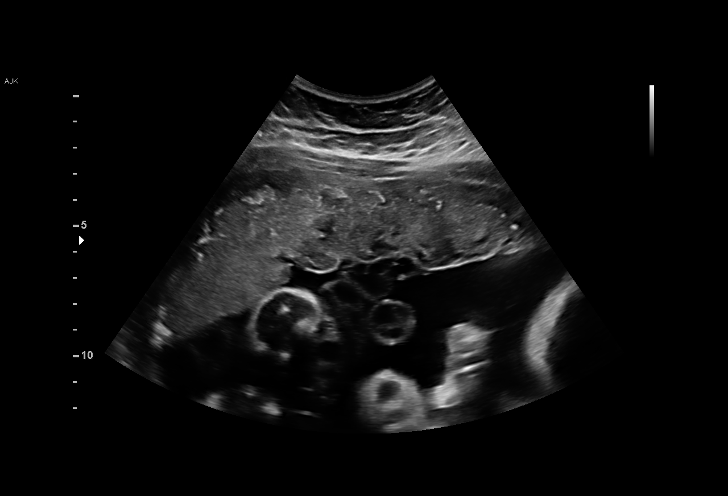
[im 35/68]
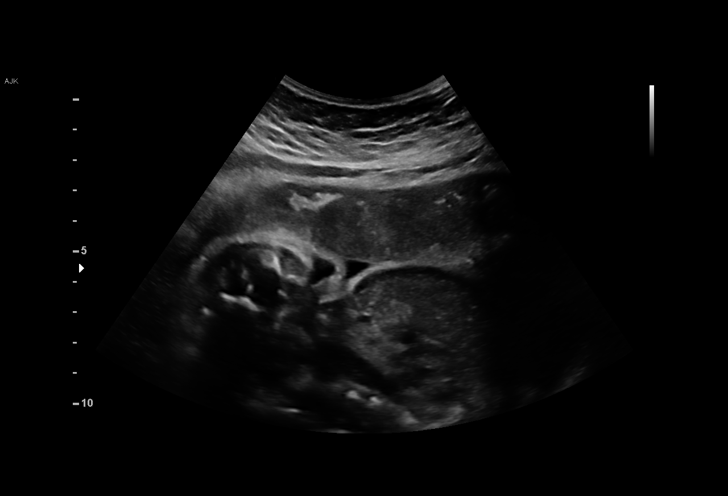
[im 40/68]
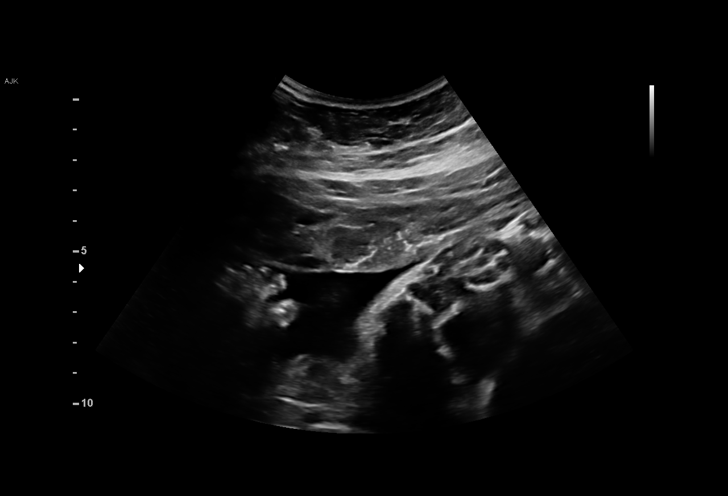
[im 45/68]
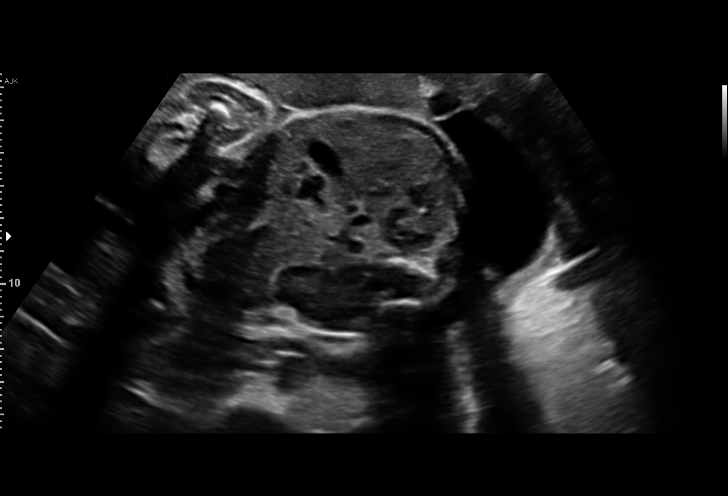
[im 50/68]
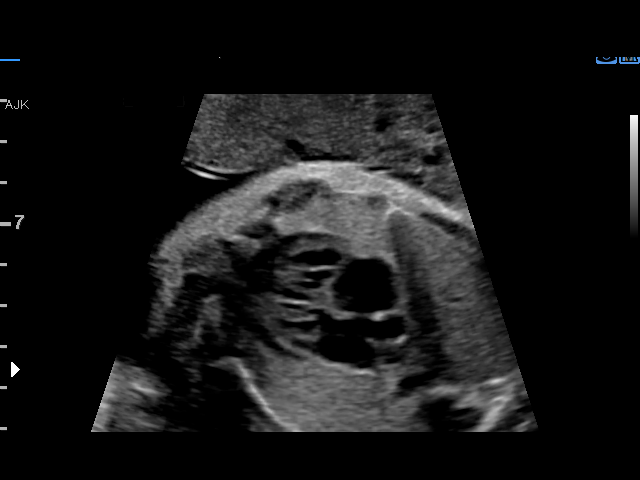
[im 55/68]
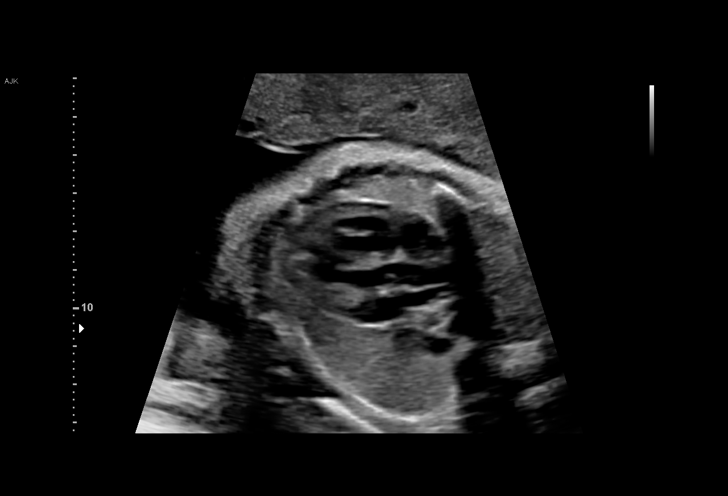
[im 60/68]
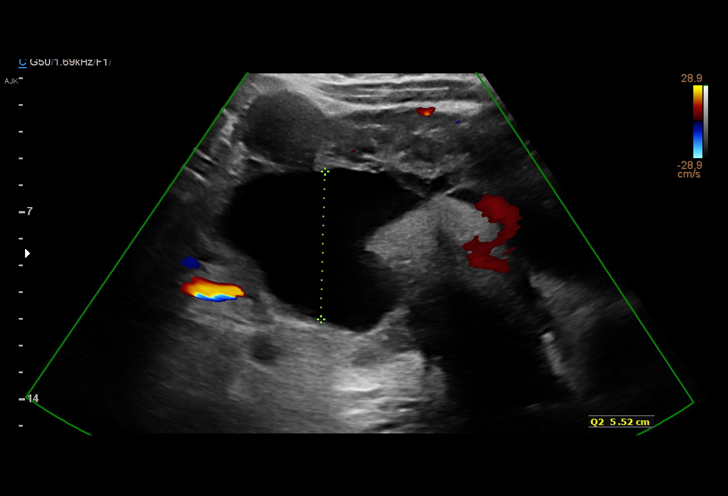
[im 65/68]
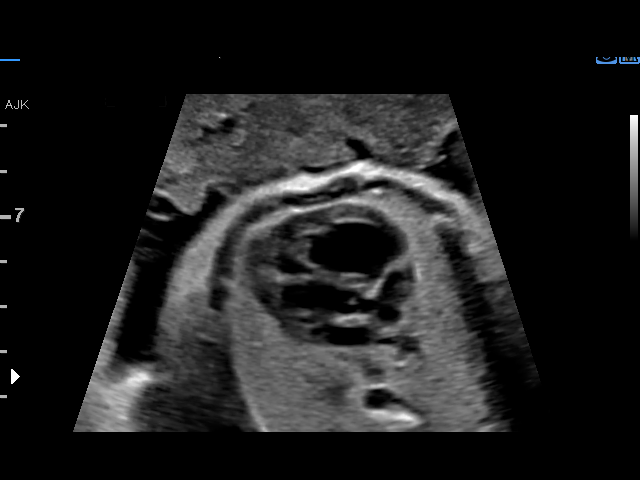

[13 of 28 positions shown; findings below may reference images not displayed]

KAHILAINEN
 ----------------------------------------------------------------------

 ----------------------------------------------------------------------
Indications

  Uterine fibroids
  Encounter for other antenatal screening
  follow-up
  30 weeks gestation of pregnancy
 ----------------------------------------------------------------------
Vital Signs

                                                Height:        5'3"
Fetal Evaluation

 Num Of Fetuses:         1
 Fetal Heart Rate(bpm):  157
 Cardiac Activity:       Observed
 Presentation:           Cephalic
 Placenta:               Anterior
 P. Cord Insertion:      Visualized

 Amniotic Fluid
 AFI FV:      Within normal limits

 AFI Sum(cm)     %Tile       Largest Pocket(cm)
 15.96           57

 RUQ(cm)       RLQ(cm)       LUQ(cm)        LLQ(cm)
 3.31          4
Biometry

 BPD:      77.6  mm     G. Age:  31w 1d         47  %    CI:        72.71   %    70 - 86
                                                         FL/HC:      19.4   %    19.3 -
 HC:      289.4  mm     G. Age:  31w 6d         42  %    HC/AC:      1.02        0.96 -
 AC:      283.2  mm     G. Age:  32w 2d         85  %    FL/BPD:     72.3   %    71 - 87
 FL:       56.1  mm     G. Age:  29w 4d          9  %    FL/AC:      19.8   %    20 - 24
 HUM:      53.9  mm     G. Age:  31w 2d         63  %

 Est. FW:    5798  gm    3 lb 14 oz      64  %
OB History

 Gravidity:    2         Term:   0        Prem:   0        SAB:   0
 TOP:          0       Ectopic:  0        Living: 1
Gestational Age

 LMP:           30w 6d        Date:  02/26/18                 EDD:   12/03/18
 U/S Today:     31w 2d                                        EDD:   11/30/18
 Best:          30w 6d     Det. By:  LMP  (02/26/18)          EDD:   12/03/18
Anatomy

 Cranium:               Appears normal         Aortic Arch:            Previously seen
 Cavum:                 Appears normal         Ductal Arch:            Previously seen
 Ventricles:            Appears normal         Diaphragm:              Appears normal
 Choroid Plexus:        Previously seen        Stomach:                Appears normal, left
                                                                       sided
 Cerebellum:            Previously seen        Abdomen:                Appears normal
 Posterior Fossa:       Previously seen        Abdominal Wall:         Previously seen
 Nuchal Fold:           Previously seen        Cord Vessels:           Previously seen
 Face:                  Orbits and profile     Kidneys:                Appear normal
                        previously seen
 Lips:                  Previously seen        Bladder:                Appears normal
 Thoracic:              Appears normal         Spine:                  Previously seen
 Heart:                 Appears normal         Upper Extremities:      Previously seen
                        (4CH, axis, and
                        situs)
 RVOT:                  Appears normal         Lower Extremities:      Previously seen
 LVOT:                  Appears normal

 Other:  Heels and left 5th digit visualized. Nasal bone visualized.
Myomas

  Site                     L(cm)      W(cm)      D(cm)      Location
  Fundus
  Anterior
  Posterior                4          4          3
 ----------------------------------------------------------------------

  Blood Flow                 RI        PI       Comments

 ----------------------------------------------------------------------
Impression

 Normal interval growth.
Recommendations

 Follow up as clinically indicated.

## 2021-04-27 ENCOUNTER — Encounter: Payer: Self-pay | Admitting: Obstetrics and Gynecology

## 2021-04-27 ENCOUNTER — Ambulatory Visit (INDEPENDENT_AMBULATORY_CARE_PROVIDER_SITE_OTHER): Payer: BC Managed Care – PPO | Admitting: Obstetrics and Gynecology

## 2021-04-27 ENCOUNTER — Ambulatory Visit (HOSPITAL_BASED_OUTPATIENT_CLINIC_OR_DEPARTMENT_OTHER)
Admission: RE | Admit: 2021-04-27 | Discharge: 2021-04-27 | Disposition: A | Discharge status: Home / Self Care | Payer: BC Managed Care – PPO | Source: Ambulatory Visit | Attending: Obstetrics and Gynecology | Admitting: Obstetrics and Gynecology

## 2021-04-27 ENCOUNTER — Other Ambulatory Visit: Payer: Self-pay

## 2021-04-27 VITALS — BP 116/85 | HR 88 | Ht 63.0 in | Wt 209.0 lb

## 2021-04-27 DIAGNOSIS — D259 Leiomyoma of uterus, unspecified: Secondary | ICD-10-CM | POA: Diagnosis present

## 2021-04-27 NOTE — Progress Notes (Signed)
35 yo P1 with Ln-IUD induced amenorrhea presenting for follow-up on fibroid. Patient reports some abdominal pain over the past few days which has since resolved and concern that she can palpate her fibroid uterus. Patient is without any other complaints. She denies pelvic pain or abnormal discharge.   Past Medical History:  Diagnosis Date   Endometriosis    Ovarian cyst    Preeclampsia, third trimester 11/27/2018   Uterine fibroid    Past Surgical History:  Procedure Laterality Date   APPENDECTOMY  03/15/11   ROBOT ASSISTED MYOMECTOMY N/A 12/11/2012   Procedure: ROBOTIC ASSISTED MYOMECTOMY;  Surgeon: Marvene Staff, MD;  Location: [facility];  Service: Gynecology;  Laterality: N/A;   Family History  Problem Relation Age of Onset   Cancer Maternal Aunt        pt unaware of what kind   Cancer Maternal Grandmother        pt unawaure of what kinf   Social History   Tobacco Use   Smoking status: Never   Smokeless tobacco: Never  Substance Use Topics   Alcohol use: No   Drug use: No   ROS See pertinent in HPI. All other systems reviewed and non contributory Blood pressure 116/85, pulse 88, height 5\' 3"  (1.6 m), weight 209 lb (94.8 kg), unknown if currently breastfeeding. GENERAL: Well-developed, well-nourished female in no acute distress.  ABDOMEN: Soft, nontender, nondistended. No organomegaly. PELVIC: Normal external female genitalia. Vagina is pink and rugated.  Normal discharge. Normal appearing cervix with IUD strings visualized at the os. Uterus is normal in size. No adnexal mass or tenderness. Chaperone present during the pelvic exam EXTREMITIES: No cyanosis, clubbing, or edema, 2+ distal pulses.  A/P 35 yo here for fibroid follow up - Pelvic ultrasound ordered - patient plans to conceive in the next year - patient will be contacted with results - RTC prn

## 2021-04-28 ENCOUNTER — Ambulatory Visit (HOSPITAL_BASED_OUTPATIENT_CLINIC_OR_DEPARTMENT_OTHER): Payer: BC Managed Care – PPO

## 2022-01-11 ENCOUNTER — Ambulatory Visit (INDEPENDENT_AMBULATORY_CARE_PROVIDER_SITE_OTHER): Payer: BC Managed Care – PPO | Admitting: Family Medicine

## 2022-01-11 ENCOUNTER — Encounter: Payer: Self-pay | Admitting: Family Medicine

## 2022-01-11 ENCOUNTER — Other Ambulatory Visit (HOSPITAL_COMMUNITY)
Admission: RE | Admit: 2022-01-11 | Discharge: 2022-01-11 | Disposition: A | Discharge status: Home / Self Care | Payer: BC Managed Care – PPO | Source: Ambulatory Visit | Attending: Family Medicine | Admitting: Family Medicine

## 2022-01-11 VITALS — BP 129/81 | HR 84 | Wt 206.0 lb

## 2022-01-11 DIAGNOSIS — Z01419 Encounter for gynecological examination (general) (routine) without abnormal findings: Secondary | ICD-10-CM | POA: Diagnosis present

## 2022-01-11 NOTE — Progress Notes (Signed)
Patient states that she is here for her annual exam. Patient states she is going to keep her IUD in now. Patient desires to conceive but her husband is not ready. [facility]

## 2022-01-11 NOTE — Progress Notes (Signed)
GYNECOLOGY ANNUAL PREVENTATIVE CARE ENCOUNTER NOTE  Subjective:   [name] is a 36 y.o. G45P1011 female here for a routine annual gynecologic exam.  Current complaints: taking phenteramine for weight loss.  She finds that she has been developing headaches.  Has not particularly noticed weight loss at the moment.  Does have irregular spotting with the IUD, but no pain. Denies abnormal vaginal bleeding, discharge, pelvic pain, problems with intercourse or other gynecologic concerns.    Gynecologic History No LMP recorded. (Menstrual status: IUD). Patient is sexually active  Contraception: IUD Last Pap: 2021. Results were: normal Last mammogram: n/a.   The pregnancy intention screening data noted above was reviewed. Potential methods of contraception were discussed. The patient elected to proceed with No data recorded.   Obstetric History OB History  Gravida Para Term Preterm AB Living  '2 1 1 '$ 0 1 1  SAB IAB Ectopic Multiple Live Births  1 0 0 0 1    # Outcome Date GA Lbr Len/2nd Weight Sex Delivery Anes PTL Lv  2 Term 11/29/18 [redacted]w[redacted]d/ 03:13 6 lb 15.1 oz (3.15 kg) F Vag-Spont EPI  LIV     Birth Comments: WDL  1 SAB             Past Medical History:  Diagnosis Date   Endometriosis    Ovarian cyst    Preeclampsia, third trimester 11/27/2018   Uterine fibroid     Past Surgical History:  Procedure Laterality Date   APPENDECTOMY  03/15/11   ROBOT ASSISTED MYOMECTOMY N/A 12/11/2012   Procedure: ROBOTIC ASSISTED MYOMECTOMY;  Surgeon: SMarvene Staff MD;  [facility];  Service: Gynecology;  Laterality: N/A;    Current Outpatient Medications on File Prior to Visit  Medication Sig Dispense Refill   Cholecalciferol (VITAMIN D3) 1.25 MG (50000 UT) CAPS      phentermine (ADIPEX-P) 37.5 MG tablet Take 37.5 mg by mouth daily.     acetaminophen (TYLENOL) 160 MG/5ML elixir Take 15 mg/kg by mouth every 4 (four) hours as needed for fever.     enalapril (VASOTEC) 5 MG  tablet Take 1 tablet (5 mg total) by mouth daily. (Patient not taking: Reported on 07/01/2019) 30 tablet 3   ibuprofen (ADVIL) 800 MG tablet Take 1 tablet (800 mg total) by mouth 3 (three) times daily. (Patient not taking: Reported on 06/19/2019) 30 tablet 0   Prenatal Vit-Fe Fumarate-FA (PRENATAL MULTIVITAMIN) TABS tablet Take 1 tablet by mouth daily at 12 noon. (Patient not taking: Reported on 01/11/2022)     No current facility-administered medications on file prior to visit.    No Known Allergies  Social History   Socioeconomic History   Marital status: Married    Spouse name: Not on file   Number of children: 1   Years of education: Not on file   Highest education level: Not on file  Occupational History   Not on file  Tobacco Use   Smoking status: Never   Smokeless tobacco: Never  Vaping Use   Vaping Use: Never used  Substance and Sexual Activity   Alcohol use: No   Drug use: No   Sexual activity: Yes    Birth control/protection: Pill  Other Topics Concern   Not on file  Social History Narrative   Not on file   Social Determinants of Health   Financial Resource Strain: Not on file  Food Insecurity: Not on file  Transportation Needs: Not on file  Physical Activity: Not  on file  Stress: Not on file  Social Connections: Not on file  Intimate Partner Violence: Not on file    Family History  Problem Relation Age of Onset   Cancer Maternal Aunt        pt unaware of what kind   Cancer Maternal Grandmother        pt unawaure of what kinf    The following portions of the patient's history were reviewed and updated as appropriate: allergies, current medications, past family history, past medical history, past social history, past surgical history and problem list.  Review of Systems Pertinent items are noted in HPI.   Objective:  BP 129/81   Pulse 84   Wt 206 lb (93.4 kg)   BMI 36.49 kg/m  Wt Readings from Last 3 Encounters:  01/11/22 206 lb (93.4 kg)   04/27/21 209 lb (94.8 kg)  06/19/19 198 lb (89.8 kg)     Chaperone present during exam  CONSTITUTIONAL: Well-developed, well-nourished female in no acute distress.  HENT:  Normocephalic, atraumatic, External right and left ear normal. Oropharynx is clear and moist EYES: Conjunctivae and EOM are normal. Pupils are equal, round, and reactive to light. No scleral icterus.  NECK: Normal range of motion, supple, no masses.  Normal thyroid.   CARDIOVASCULAR: Normal heart rate noted, regular rhythm RESPIRATORY: Clear to auscultation bilaterally. Effort and breath sounds normal, no problems with respiration noted. BREASTS: Symmetric in size. No masses, skin changes, nipple drainage, or lymphadenopathy. ABDOMEN: Soft, normal bowel sounds, no distention noted.  No tenderness, rebound or guarding.  PELVIC: Normal appearing external genitalia; normal appearing vaginal mucosa and cervix.  No abnormal discharge noted.  Normal uterine size, no other palpable masses, no uterine or adnexal tenderness. IUD strings seen. MUSCULOSKELETAL: Normal range of motion. No tenderness.  No cyanosis, clubbing, or edema.  2+ distal pulses. SKIN: Skin is warm and dry. No rash noted. Not diaphoretic. No erythema. No pallor. NEUROLOGIC: Alert and oriented to person, place, and time. Normal reflexes, muscle tone coordination. No cranial nerve deficit noted. PSYCHIATRIC: Normal mood and affect. Normal behavior. Normal judgment and thought content.  Assessment:  Annual gynecologic examination with pap smear   Plan:  1. Well Woman Exam Will follow up results of pap smear and manage accordingly. IUD strings seen. - Cytology - PAP( [place])   Routine preventative health maintenance measures emphasized. Please refer to After Visit Summary for other counseling recommendations.    Loma Boston, [place] for [employer]

## 2022-01-17 LAB — CYTOLOGY - PAP
Adequacy: ABSENT
Comment: NEGATIVE
Diagnosis: NEGATIVE
High risk HPV: NEGATIVE

## 2022-01-19 MED ORDER — FLUCONAZOLE 150 MG PO TABS: 150.0000 mg | ORAL_TABLET | Freq: Once | ORAL | 3 refills | Status: AC

## 2022-01-19 NOTE — Addendum Note (Signed)
Addended by: Truett Mainland on: 01/19/2022 12:20 PM   Modules accepted: Orders

## 2022-03-01 ENCOUNTER — Ambulatory Visit (INDEPENDENT_AMBULATORY_CARE_PROVIDER_SITE_OTHER): Payer: BC Managed Care – PPO | Admitting: Family Medicine

## 2022-03-01 ENCOUNTER — Encounter: Payer: Self-pay | Admitting: Family Medicine

## 2022-03-01 VITALS — BP 117/82 | HR 86 | Ht 63.0 in | Wt 208.0 lb

## 2022-03-01 DIAGNOSIS — Z30432 Encounter for removal of intrauterine contraceptive device: Secondary | ICD-10-CM

## 2022-03-01 NOTE — Progress Notes (Signed)
    IUD Removal  Patient was in the dorsal lithotomy position, normal external genitalia was noted.  A speculum was placed in the patient's vagina, normal discharge was noted, no lesions. The multiparous cervix was visualized, no lesions, no abnormal discharge,  and was swabbed with Betadine using scopettes.  The strings of the IUD was grasped and pulled using ring forceps.  The IUD was successfully removed in its entirety.  Patient tolerated the procedure well.

## 2022-03-01 NOTE — Progress Notes (Signed)
Patient would like her IUD removed. Patient states they are trying to conceive. Kathrene Alu RN

## 2022-09-27 ENCOUNTER — Encounter: Payer: Self-pay | Admitting: Family Medicine

## 2022-09-27 ENCOUNTER — Other Ambulatory Visit (HOSPITAL_COMMUNITY)
Admission: RE | Admit: 2022-09-27 | Discharge: 2022-09-27 | Disposition: A | Discharge status: Home / Self Care | Payer: BC Managed Care – PPO | Source: Ambulatory Visit | Attending: Family Medicine | Admitting: Family Medicine

## 2022-09-27 ENCOUNTER — Ambulatory Visit (INDEPENDENT_AMBULATORY_CARE_PROVIDER_SITE_OTHER): Payer: BC Managed Care – PPO | Admitting: Family Medicine

## 2022-09-27 ENCOUNTER — Other Ambulatory Visit (INDEPENDENT_AMBULATORY_CARE_PROVIDER_SITE_OTHER): Payer: BC Managed Care – PPO

## 2022-09-27 VITALS — BP 112/72 | HR 96 | Wt <= 1120 oz

## 2022-09-27 DIAGNOSIS — D259 Leiomyoma of uterus, unspecified: Secondary | ICD-10-CM | POA: Diagnosis not present

## 2022-09-27 DIAGNOSIS — Z3A19 19 weeks gestation of pregnancy: Secondary | ICD-10-CM | POA: Diagnosis not present

## 2022-09-27 DIAGNOSIS — Z3480 Encounter for supervision of other normal pregnancy, unspecified trimester: Secondary | ICD-10-CM | POA: Diagnosis present

## 2022-09-27 DIAGNOSIS — O3412 Maternal care for benign tumor of corpus uteri, second trimester: Secondary | ICD-10-CM | POA: Diagnosis not present

## 2022-09-27 DIAGNOSIS — Z9889 Other specified postprocedural states: Secondary | ICD-10-CM | POA: Diagnosis not present

## 2022-09-27 DIAGNOSIS — Z6838 Body mass index (BMI) 38.0-38.9, adult: Secondary | ICD-10-CM

## 2022-09-27 DIAGNOSIS — O09291 Supervision of pregnancy with other poor reproductive or obstetric history, first trimester: Secondary | ICD-10-CM

## 2022-09-27 DIAGNOSIS — O09299 Supervision of pregnancy with other poor reproductive or obstetric history, unspecified trimester: Secondary | ICD-10-CM

## 2022-09-27 DIAGNOSIS — Z3481 Encounter for supervision of other normal pregnancy, first trimester: Secondary | ICD-10-CM | POA: Diagnosis not present

## 2022-09-27 DIAGNOSIS — O3680X0 Pregnancy with inconclusive fetal viability, not applicable or unspecified: Secondary | ICD-10-CM

## 2022-09-27 DIAGNOSIS — Z3A10 10 weeks gestation of pregnancy: Secondary | ICD-10-CM | POA: Diagnosis not present

## 2022-09-27 DIAGNOSIS — O43193 Other malformation of placenta, third trimester: Secondary | ICD-10-CM | POA: Diagnosis not present

## 2022-09-27 DIAGNOSIS — O09522 Supervision of elderly multigravida, second trimester: Secondary | ICD-10-CM | POA: Diagnosis not present

## 2022-09-27 DIAGNOSIS — O99212 Obesity complicating pregnancy, second trimester: Secondary | ICD-10-CM | POA: Diagnosis not present

## 2022-09-27 DIAGNOSIS — O09293 Supervision of pregnancy with other poor reproductive or obstetric history, third trimester: Secondary | ICD-10-CM | POA: Diagnosis not present

## 2022-09-27 DIAGNOSIS — Z1339 Encounter for screening examination for other mental health and behavioral disorders: Secondary | ICD-10-CM

## 2022-09-27 DIAGNOSIS — O09523 Supervision of elderly multigravida, third trimester: Secondary | ICD-10-CM | POA: Diagnosis not present

## 2022-09-27 DIAGNOSIS — O99820 Streptococcus B carrier state complicating pregnancy: Secondary | ICD-10-CM | POA: Diagnosis not present

## 2022-09-27 DIAGNOSIS — Z3A38 38 weeks gestation of pregnancy: Secondary | ICD-10-CM | POA: Diagnosis not present

## 2022-09-27 MED ORDER — ASPIRIN 81 MG PO CHEW: 162.0000 mg | CHEWABLE_TABLET | Freq: Every day | ORAL | 3 refills | Status: DC

## 2022-09-27 NOTE — Progress Notes (Signed)
Subjective:   [name] is a 37 y.o. G3P1011 at [redacted]w[redacted]d by early ultrasound being seen today for her first obstetrical visit.  Her obstetrical history is significant for advanced maternal age and obesity. Patient does intend to breast feed. Pregnancy history fully reviewed.  Patient reports no complaints.  HISTORY: OB History  Gravida Para Term Preterm AB Living  3 1 1  0 1 1  SAB IAB Ectopic Multiple Live Births  1 0 0 0 1    # Outcome Date GA Lbr Len/2nd Weight Sex Delivery Anes PTL Lv  3 Current           2 Term 11/29/18 [redacted]w[redacted]d / 03:13 6 lb 15.1 oz (3.15 kg) F Vag-Spont EPI  LIV     Birth Comments: WDL     Name: Breitenstein,GIRL Yenni     Apgar1: 8  Apgar5: 9  1 SAB            Last pap smear was  12/2021 and was normal Past Medical History:  Diagnosis Date   Endometriosis    Ovarian cyst    Preeclampsia, third trimester 11/27/2018   Uterine fibroid    Past Surgical History:  Procedure Laterality Date   APPENDECTOMY  03/15/11   ROBOT ASSISTED MYOMECTOMY N/A 12/11/2012   Procedure: ROBOTIC ASSISTED MYOMECTOMY;  Surgeon: Serita Kyle, MD;  Location: [facility];  Service: Gynecology;  Laterality: N/A;   Family History  Problem Relation Age of Onset   Cancer Maternal Aunt        pt unaware of what kind   Cancer Maternal Grandmother        pt unawaure of what kinf   Social History   Tobacco Use   Smoking status: Never   Smokeless tobacco: Never  Vaping Use   Vaping Use: Never used  Substance Use Topics   Alcohol use: No   Drug use: No   No Known Allergies Current Outpatient Medications on File Prior to Visit  Medication Sig Dispense Refill   Prenatal Vit-Fe Fumarate-FA (PRENATAL VITAMINS PO) Take by mouth.     acetaminophen (TYLENOL) 160 MG/5ML elixir Take 15 mg/kg by mouth every 4 (four) hours as needed for fever. (Patient not taking: Reported on 09/27/2022)     Cholecalciferol (VITAMIN D3) 1.25 MG (50000 UT) CAPS      No current facility-administered  medications on file prior to visit.     Exam   Vitals:   09/27/22 1312  BP: 112/72  Pulse: 96  Weight: 22 lb (9.979 kg)   Fetal Heart Rate (bpm): 165  System: General: well-developed, well-nourished female in no acute distress   Skin: normal coloration and turgor, no rashes   Neurologic: oriented, normal, negative, normal mood   Extremities: normal strength, tone, and muscle mass, ROM of all joints is normal   HEENT PERRLA, extraocular movement intact and sclera clear, anicteric   Mouth/Teeth mucous membranes moist, pharynx normal without lesions and dental hygiene good   Neck supple and no masses   Cardiovascular: regular rate and rhythm   Respiratory:  no respiratory distress, normal breath sounds   Abdomen: soft, non-tender; bowel sounds normal; no masses,  no organomegaly     Assessment:   Pregnancy: [id number] Patient Active Problem List   Diagnosis Date Noted   Supervision of other normal pregnancy, antepartum 09/27/2022   Yeast vaginitis 10/09/2018   Fibroid 04/02/2016   Endometriosis 04/02/2016   H/O myomectomy 12/12/2012     Plan:  1. Encounter to determine fetal viability of pregnancy, single or unspecified fetus Dating by today's scan - US OB Limited; Future  2. Supervision of other normal pregnancy, antepartum New OB labs today - CBC/D/Plt+RPR+Rh+ABO+RubIgG... - Culture, OB Urine - [place] MFM OB DETAIL +14 WK; Future - [facility] AMB BABYSCRIPTS OPT IN - GC/Chlamydia probe amp ([place])not at [facility] - Hemoglobin A1c  3. BMI 38.0-38.9,adult - TSH - Hemoglobin A1c  4. H/O myomectomy Has had SVD since  5. History of pre-eclampsia in prior pregnancy, currently pregnant Baseline labs ASA starting @ 12 weeks - Comprehensive metabolic panel - Protein / creatinine ratio, urine - aspirin 81 MG chewable tablet; Chew 2 tablets (162 mg total) by mouth daily.  Dispense: 180 tablet; Refill: 3   Initial labs drawn. Continue prenatal vitamins. Genetic Screening  discussed, NIPS: undecided. Ultrasound discussed; fetal anatomic survey: ordered. Problem list reviewed and updated.  Routine obstetric precautions reviewed. Return in 4 weeks (on 10/25/2022).

## 2022-09-28 ENCOUNTER — Encounter: Payer: Self-pay | Admitting: Family Medicine

## 2022-09-28 ENCOUNTER — Telehealth: Payer: Self-pay

## 2022-09-28 ENCOUNTER — Ambulatory Visit: Payer: BC Managed Care – PPO

## 2022-09-28 DIAGNOSIS — R7303 Prediabetes: Secondary | ICD-10-CM

## 2022-09-28 HISTORY — DX: Prediabetes: R73.03

## 2022-09-28 LAB — COMPREHENSIVE METABOLIC PANEL
ALT: 11 IU/L (ref 0–32)
AST: 13 IU/L (ref 0–40)
Albumin/Globulin Ratio: 1.5 (ref 1.2–2.2)
Albumin: 4.5 g/dL (ref 3.9–4.9)
Alkaline Phosphatase: 60 IU/L (ref 44–121)
BUN/Creatinine Ratio: 10 (ref 9–23)
BUN: 7 mg/dL (ref 6–20)
Bilirubin Total: 0.2 mg/dL (ref 0.0–1.2)
CO2: 21 mmol/L (ref 20–29)
Calcium: 10.2 mg/dL (ref 8.7–10.2)
Chloride: 99 mmol/L (ref 96–106)
Creatinine, Ser: 0.73 mg/dL (ref 0.57–1.00)
Globulin, Total: 3.1 g/dL (ref 1.5–4.5)
Glucose: 75 mg/dL (ref 70–99)
Potassium: 4.4 mmol/L (ref 3.5–5.2)
Sodium: 135 mmol/L (ref 134–144)
Total Protein: 7.6 g/dL (ref 6.0–8.5)
eGFR: 111 mL/min/{1.73_m2} (ref >59)

## 2022-09-28 LAB — HEMOGLOBIN A1C
Est. average glucose Bld gHb Est-mCnc: 134 mg/dL
Hgb A1c MFr Bld: 6.3 % — ABNORMAL HIGH (ref 4.8–5.6)

## 2022-09-28 LAB — CBC/D/PLT+RPR+RH+ABO+RUBIGG...
Antibody Screen: NEGATIVE
Basophils Absolute: 0.1 10*3/uL (ref 0.0–0.2)
Basos: 0 %
EOS (ABSOLUTE): 0.3 10*3/uL (ref 0.0–0.4)
Eos: 2 %
HCV Ab: NONREACTIVE
HIV Screen 4th Generation wRfx: NONREACTIVE
Hematocrit: 37.2 % (ref 34.0–46.6)
Hemoglobin: 12.2 g/dL (ref 11.1–15.9)
Hepatitis B Surface Ag: NEGATIVE
Immature Grans (Abs): 0.1 10*3/uL (ref 0.0–0.1)
Immature Granulocytes: 1 %
Lymphocytes Absolute: 3 10*3/uL (ref 0.7–3.1)
Lymphs: 18 %
MCH: 29.1 pg (ref 26.6–33.0)
MCHC: 32.8 g/dL (ref 31.5–35.7)
MCV: 89 fL (ref 79–97)
Monocytes Absolute: 1.1 10*3/uL — ABNORMAL HIGH (ref 0.1–0.9)
Monocytes: 7 %
Neutrophils Absolute: 12 10*3/uL — ABNORMAL HIGH (ref 1.4–7.0)
Neutrophils: 72 %
Platelets: 286 10*3/uL (ref 150–450)
RBC: 4.19 x10E6/uL (ref 3.77–5.28)
RDW: 13.1 % (ref 11.7–15.4)
RPR Ser Ql: NONREACTIVE
Rh Factor: POSITIVE
Rubella Antibodies, IGG: 4.56 index (ref >0.99)
WBC: 16.5 10*3/uL — ABNORMAL HIGH (ref 3.4–10.8)

## 2022-09-28 LAB — HCV INTERPRETATION

## 2022-09-28 LAB — PROTEIN / CREATININE RATIO, URINE
Creatinine, Urine: 295.4 mg/dL
Protein, Ur: 21 mg/dL
Protein/Creat Ratio: 71 mg/g creat (ref 0–200)

## 2022-09-28 LAB — TSH: TSH: 3.16 u[IU]/mL (ref 0.450–4.500)

## 2022-09-28 NOTE — Telephone Encounter (Signed)
Left message for pt to return call to office. Mayson Mcneish RN 

## 2022-09-28 NOTE — Telephone Encounter (Signed)
Patient called and scheduled for early 2 hr gtt. Armandina Stammer RN

## 2022-09-28 NOTE — Telephone Encounter (Signed)
-----   Message from Reva Bores, MD sent at 09/28/2022  8:01 AM EDT ----- Has pre-diabetes, please book for early 2 hour

## 2022-09-29 LAB — URINE CULTURE, OB REFLEX

## 2022-09-29 LAB — CULTURE, OB URINE

## 2022-10-01 ENCOUNTER — Ambulatory Visit: Payer: BC Managed Care – PPO

## 2022-10-01 DIAGNOSIS — R7303 Prediabetes: Secondary | ICD-10-CM

## 2022-10-01 LAB — GC/CHLAMYDIA PROBE AMP (~~LOC~~) NOT AT ARMC
Chlamydia: NEGATIVE
Comment: NEGATIVE
Comment: NORMAL
Neisseria Gonorrhea: NEGATIVE

## 2022-10-02 LAB — GLUCOSE TOLERANCE, 2 HOURS W/ 1HR
Glucose, 1 hour: 149 mg/dL (ref 70–179)
Glucose, 2 hour: 122 mg/dL (ref 70–152)
Glucose, Fasting: 90 mg/dL (ref 70–91)

## 2022-10-17 IMAGING — US US PELVIS COMPLETE WITH TRANSVAGINAL
1 series · 13 of 25 positions shown · non-contrast
Comparison: None.
COMPARISON: None.

Addendum:
CLINICAL DATA: Fibroid uterus

EXAM:
TRANSABDOMINAL AND TRANSVAGINAL ULTRASOUND OF PELVIS
DOPPLER ULTRASOUND OF OVARIES
TECHNIQUE: Both transabdominal and transvaginal ultrasound examinations of the
pelvis were performed. Transabdominal technique was performed for
global imaging of the pelvis including uterus, ovaries, adnexal
regions, and pelvic cul-de-sac.
It was necessary to proceed with endovaginal exam following the
transabdominal exam to visualize the ovaries. Color and duplex
Doppler ultrasound was utilized to evaluate blood flow to the
ovaries.

[Series 1: us pelvis complete with transvaginal · 13 of 106 slices shown]
[im 1/106]
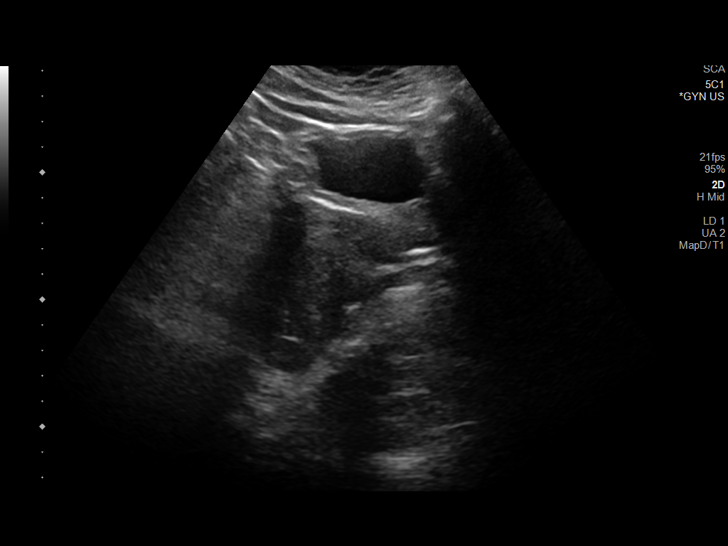
[im 9/106]
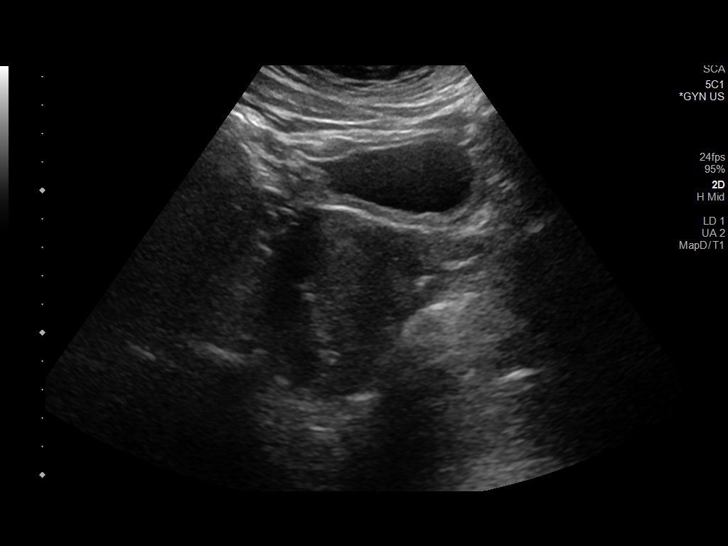
[im 18/106]
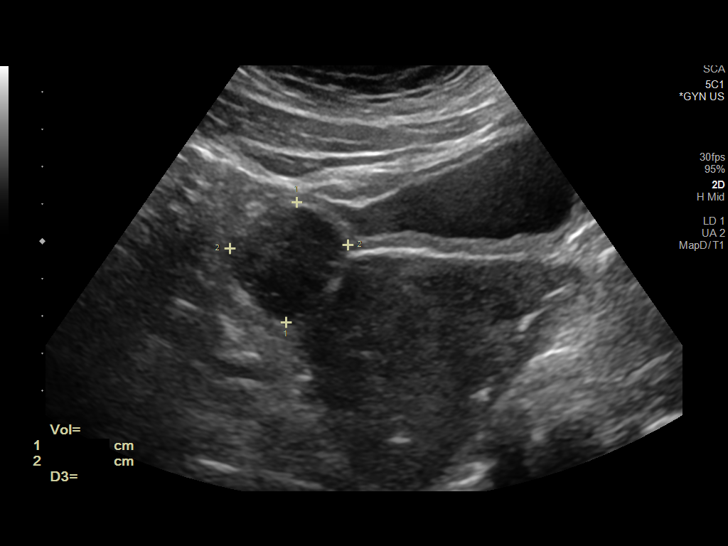
[im 27/106]
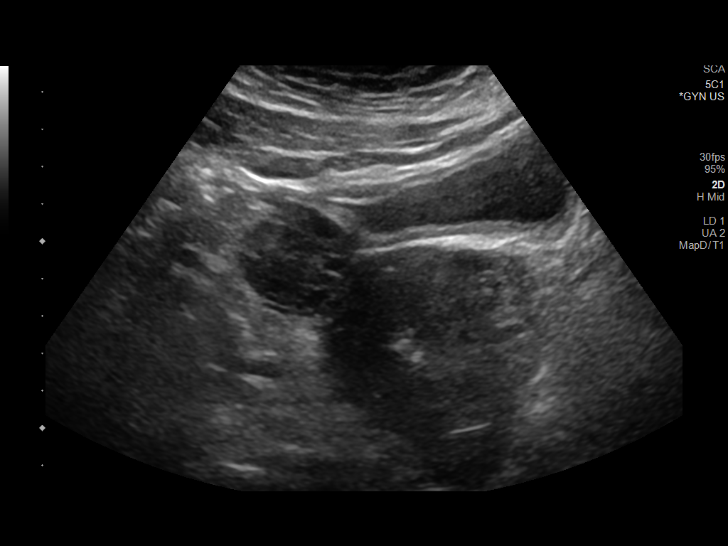
[im 36/106]
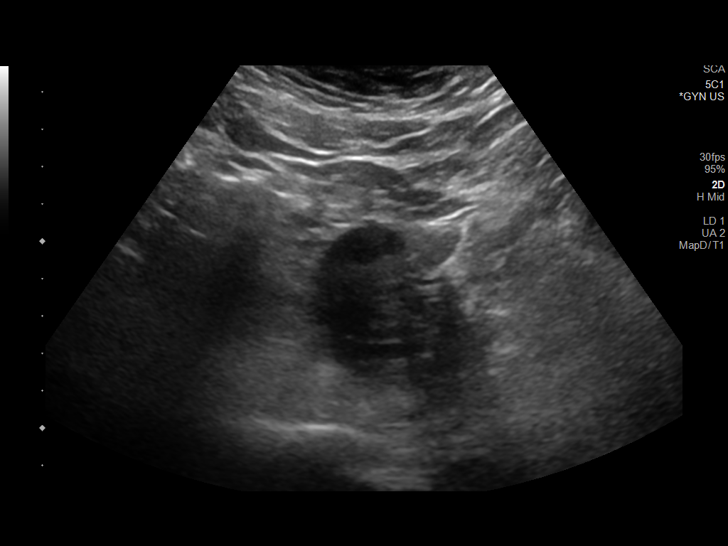
[im 44/106]
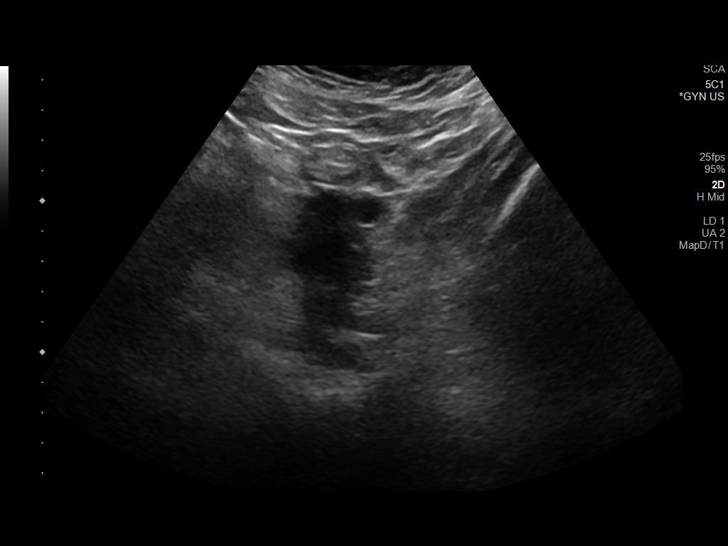
[im 53/106]
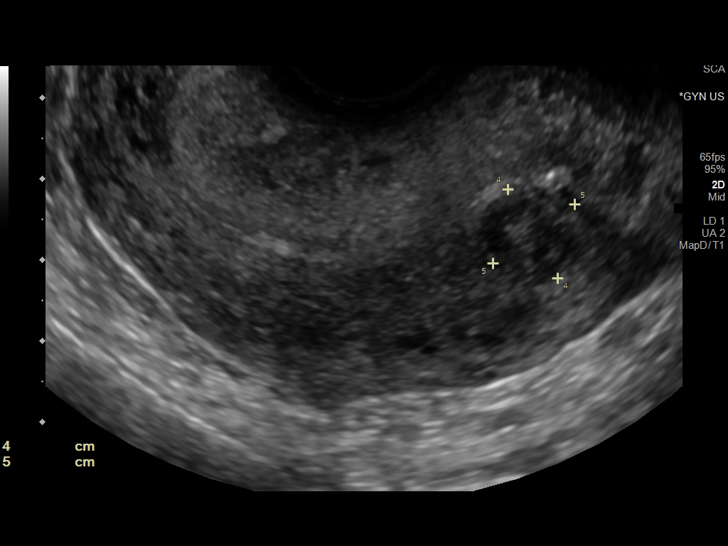
[im 62/106]
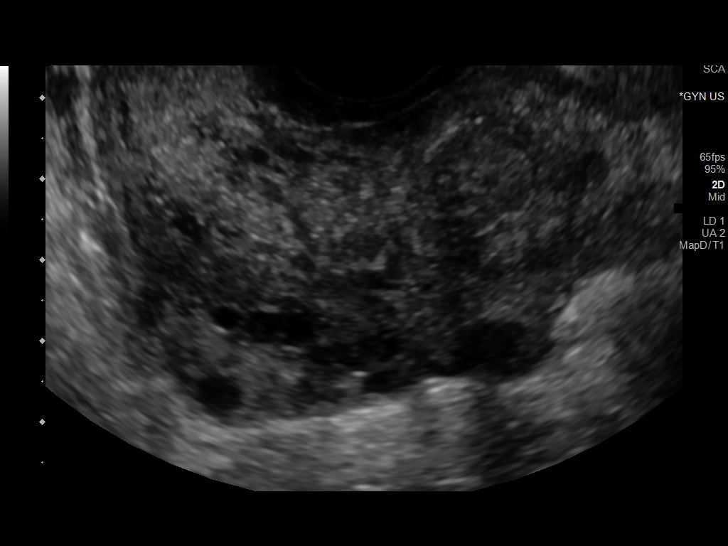
[im 71/106]
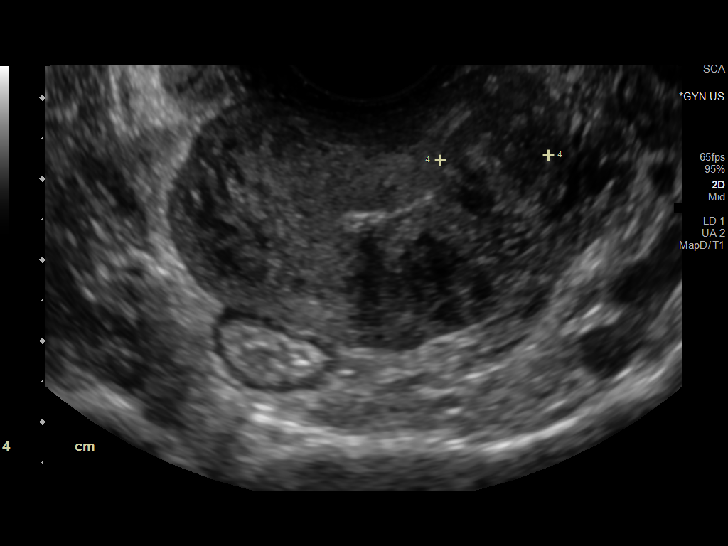
[im 79/106]
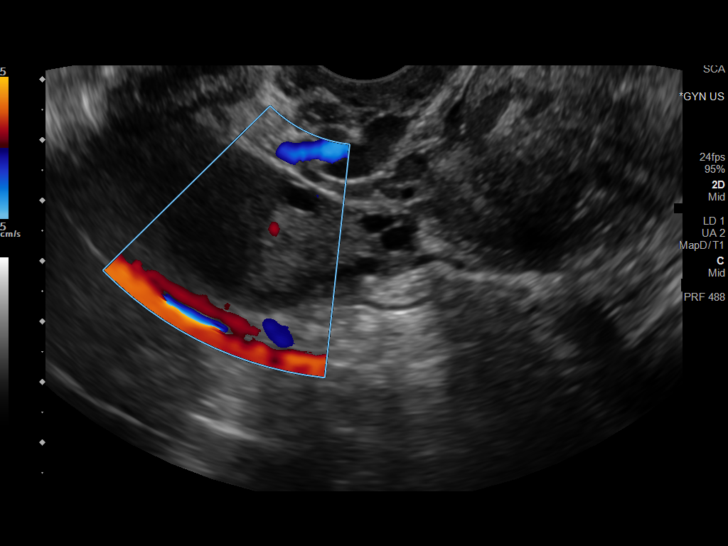
[im 88/106]
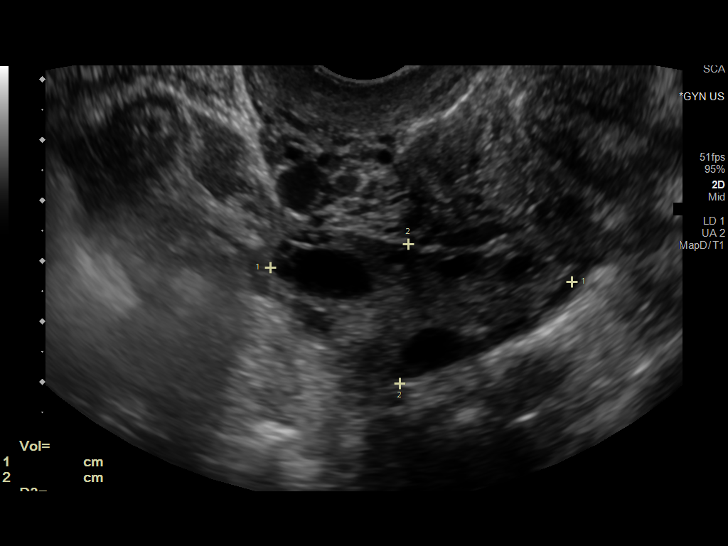
[im 97/106]
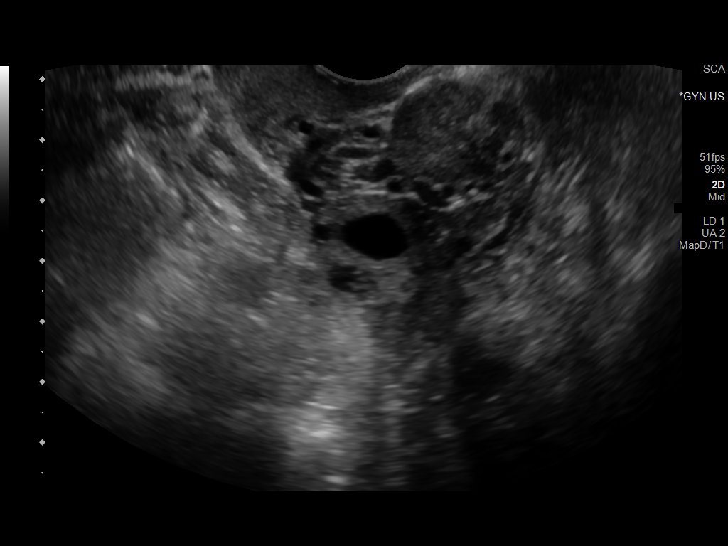
[im 106/106]
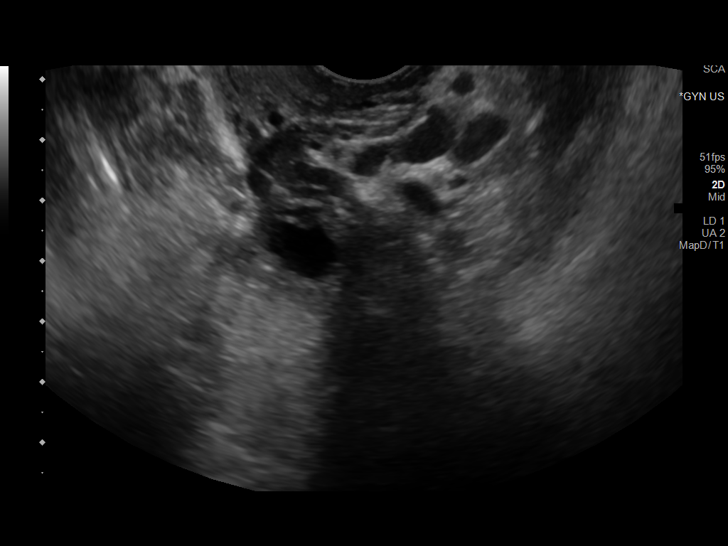

[13 of 25 positions shown; findings below may reference images not displayed]

FINDINGS: Uterus

Measurements: 6.5 x 4.9 x 4.8 cm = volume: 79.5 mL. There is
inhomogeneous echogenicity. There is 1.3 x 1 cm fibroid in the body.
There is 1.3 x 1 cm fibroid in the anterior body. There is 1.4 x
cm fibroid on the left side. Uterus is retroverted.

Endometrium

Thickness: 2.4. Linear hyperechoic focus would be consistent with
IUD.

Right ovary

Measurements: 4.1 x 2.3 x 2.2 cm = volume: 10.9 mL. Normal
appearance/no adnexal mass.

Left ovary

Measurements: 5 x 2.3 x 2.6 cm = volume: 15.5 mL. Normal
appearance/no adnexal mass.

Pulsed Doppler evaluation of both ovaries demonstrates normal
low-resistance arterial and venous waveforms.

Other findings

No abnormal free fluid.
IMPRESSION: There is inhomogeneous echogenicity in myometrium with multiple
small fibroids. IUD is seen in the endometrial cavity.

ADDENDUM:
This addendum is made to clarify Doppler technique used in this
study. Color flow Doppler was performed. Pulsed Doppler examination
was not done.

*** End of Addendum ***
FINDINGS: Uterus

Measurements: 6.5 x 4.9 x 4.8 cm = volume: 79.5 mL. There is
inhomogeneous echogenicity. There is 1.3 x 1 cm fibroid in the body.
There is 1.3 x 1 cm fibroid in the anterior body. There is 1.4 x
cm fibroid on the left side. Uterus is retroverted.

Endometrium

Thickness: 2.4. Linear hyperechoic focus would be consistent with
IUD.

Right ovary

Measurements: 4.1 x 2.3 x 2.2 cm = volume: 10.9 mL. Normal
appearance/no adnexal mass.

Left ovary

Measurements: 5 x 2.3 x 2.6 cm = volume: 15.5 mL. Normal
appearance/no adnexal mass.

Pulsed Doppler evaluation of both ovaries demonstrates normal
low-resistance arterial and venous waveforms.

Other findings

No abnormal free fluid.
IMPRESSION: There is inhomogeneous echogenicity in myometrium with multiple
small fibroids. IUD is seen in the endometrial cavity.

## 2022-10-25 ENCOUNTER — Ambulatory Visit (INDEPENDENT_AMBULATORY_CARE_PROVIDER_SITE_OTHER): Payer: BC Managed Care – PPO | Admitting: Family Medicine

## 2022-10-25 VITALS — BP 110/74 | HR 93 | Wt 220.0 lb

## 2022-10-25 DIAGNOSIS — Z9889 Other specified postprocedural states: Secondary | ICD-10-CM

## 2022-10-25 DIAGNOSIS — R7303 Prediabetes: Secondary | ICD-10-CM

## 2022-10-25 DIAGNOSIS — Z3480 Encounter for supervision of other normal pregnancy, unspecified trimester: Secondary | ICD-10-CM

## 2022-10-25 DIAGNOSIS — N809 Endometriosis, unspecified: Secondary | ICD-10-CM

## 2022-10-25 NOTE — Progress Notes (Signed)
   PRENATAL VISIT NOTE  Subjective:  [name] is a 37 y.o. G3P1011 at [redacted]w[redacted]d being seen today for ongoing prenatal care.  She is currently monitored for the following issues for this high-risk pregnancy and has H/O myomectomy; Fibroid; Endometriosis; Yeast vaginitis; Supervision of other normal pregnancy, antepartum; and Pre-diabetes on their problem list.  Patient reports nausea.  Contractions: Not present. Vag. Bleeding: None.  Movement: Absent. Denies leaking of fluid.   The following portions of the patient's history were reviewed and updated as appropriate: allergies, current medications, past family history, past medical history, past social history, past surgical history and problem list.   Objective:   Vitals:   10/25/22 1348  BP: 110/74  Pulse: 93  Weight: 220 lb (99.8 kg)    Fetal Status: Fetal Heart Rate (bpm): 156   Movement: Absent     General:  Alert, oriented and cooperative. Patient is in no acute distress.  Skin: Skin is warm and dry. No rash noted.   Cardiovascular: Normal heart rate noted  Respiratory: Normal respiratory effort, no problems with respiration noted  Abdomen: Soft, gravid, appropriate for gestational age.  Pain/Pressure: Absent     Pelvic: Cervical exam deferred        Extremities: Normal range of motion.  Edema: None  Mental Status: Normal mood and affect. Normal behavior. Normal judgment and thought content.   Assessment and Plan:  Pregnancy: G3P1011 at [redacted]w[redacted]d 1. Supervision of other normal pregnancy, antepartum FHT normal  2. Pre-diabetes Working on changing diet Early 2hr GTT normal  3. H/O myomectomy Had SVD since  4. Endometriosis   Preterm labor symptoms and general obstetric precautions including but not limited to vaginal bleeding, contractions, leaking of fluid and fetal movement were reviewed in detail with the patient. Please refer to After Visit Summary for other counseling recommendations.   No follow-ups on  file.  Future Appointments  Date Time Provider Department Center  11/27/2022  9:30 AM [facility] NURSE [facility]  11/27/2022  9:45 AM [facility] US5 [facility]  11/29/2022  1:30 PM Levie Heritage, DO [facility] None    Levie Heritage, DO

## 2022-10-31 ENCOUNTER — Encounter: Payer: Self-pay | Admitting: Family Medicine

## 2022-10-31 MED ORDER — ONDANSETRON 4 MG PO TBDP: 4.0000 mg | ORAL_TABLET | Freq: Four times a day (QID) | ORAL | 3 refills | Status: DC | PRN

## 2022-11-21 DIAGNOSIS — O09299 Supervision of pregnancy with other poor reproductive or obstetric history, unspecified trimester: Secondary | ICD-10-CM

## 2022-11-21 DIAGNOSIS — O09529 Supervision of elderly multigravida, unspecified trimester: Secondary | ICD-10-CM | POA: Insufficient documentation

## 2022-11-21 DIAGNOSIS — O99210 Obesity complicating pregnancy, unspecified trimester: Secondary | ICD-10-CM | POA: Insufficient documentation

## 2022-11-21 HISTORY — DX: Supervision of pregnancy with other poor reproductive or obstetric history, unspecified trimester: O09.299

## 2022-11-27 ENCOUNTER — Ambulatory Visit: Payer: BC Managed Care – PPO | Attending: Obstetrics | Admitting: Obstetrics

## 2022-11-27 ENCOUNTER — Ambulatory Visit: Payer: BC Managed Care – PPO | Admitting: *Deleted

## 2022-11-27 ENCOUNTER — Other Ambulatory Visit: Payer: Self-pay | Admitting: *Deleted

## 2022-11-27 ENCOUNTER — Ambulatory Visit: Payer: BC Managed Care – PPO | Attending: Family Medicine

## 2022-11-27 ENCOUNTER — Encounter: Payer: Self-pay | Admitting: Family Medicine

## 2022-11-27 VITALS — BP 114/70 | HR 110

## 2022-11-27 DIAGNOSIS — O09522 Supervision of elderly multigravida, second trimester: Secondary | ICD-10-CM

## 2022-11-27 DIAGNOSIS — D259 Leiomyoma of uterus, unspecified: Secondary | ICD-10-CM

## 2022-11-27 DIAGNOSIS — O3429 Maternal care due to uterine scar from other previous surgery: Secondary | ICD-10-CM

## 2022-11-27 DIAGNOSIS — O09299 Supervision of pregnancy with other poor reproductive or obstetric history, unspecified trimester: Secondary | ICD-10-CM | POA: Insufficient documentation

## 2022-11-27 DIAGNOSIS — O99212 Obesity complicating pregnancy, second trimester: Secondary | ICD-10-CM

## 2022-11-27 DIAGNOSIS — O3412 Maternal care for benign tumor of corpus uteri, second trimester: Secondary | ICD-10-CM | POA: Insufficient documentation

## 2022-11-27 DIAGNOSIS — Z3A19 19 weeks gestation of pregnancy: Secondary | ICD-10-CM | POA: Insufficient documentation

## 2022-11-27 DIAGNOSIS — Z3480 Encounter for supervision of other normal pregnancy, unspecified trimester: Secondary | ICD-10-CM

## 2022-11-27 DIAGNOSIS — O43199 Other malformation of placenta, unspecified trimester: Secondary | ICD-10-CM | POA: Insufficient documentation

## 2022-11-27 NOTE — Progress Notes (Signed)
MFM Note  [name] was seen for a detailed fetal anatomy scan due to maternal obesity with a BMI of 46, advanced maternal age (37 years old), and a fibroid uterus.  She denies any significant past medical history and denies any problems in her current pregnancy.    She has declined all screening tests for fetal aneuploidy in her current pregnancy.  She was informed that the fetal growth and amniotic fluid level were appropriate for her gestational age.   There were no obvious fetal anomalies noted on today's ultrasound exam.  However, today's exam was limited due to the fetal position.  The patient was informed that anomalies may be missed due to technical limitations. If the fetus is in a suboptimal position or maternal habitus is increased, visualization of the fetus in the maternal uterus may be impaired.  A possible anterior succenturiate lobe of the placenta was noted today.  The main posterior lobe appeared within normal limits.  There were no signs of vasa previa noted today.  We will continue to assess this finding on her future exams.  The following were discussed during today's consultation:  Advanced maternal age and pregnancy  The increased risk of fetal aneuploidy due to advanced maternal age was discussed.   Due to advanced maternal age, the patient was offered and declined a cell free DNA test today.    She was also offered and declined an amniocentesis today for definitive diagnosis of fetal aneuploidy.  She reports that she is comfortable with today's ultrasound findings.  Fibroids in pregnancy  Multiple fibroids were noted in her uterus today.  The fibroids are mostly located on the anterior aspect of her uterus with multiple fibroids in the lower uterine segment.  The increased risk of maternal pain issues and fetal growth issues later in her pregnancy due to the fibroids was discussed.   Due to her fibroid uterus, we will continue to follow her with growth  ultrasounds throughout her pregnancy.  A follow-up exam was scheduled in 4 weeks to assess the fetal growth and to complete the views of the fetal anatomy.    The patient stated that all of her questions were answered.  A total of 45 minutes was spent counseling and coordinating the care for this patient.  Greater than 50% of the time was spent in direct face-to-face contact.

## 2022-11-29 ENCOUNTER — Ambulatory Visit (INDEPENDENT_AMBULATORY_CARE_PROVIDER_SITE_OTHER): Payer: BC Managed Care – PPO | Admitting: Family Medicine

## 2022-11-29 VITALS — BP 119/76 | HR 101 | Wt 224.0 lb

## 2022-11-29 DIAGNOSIS — D219 Benign neoplasm of connective and other soft tissue, unspecified: Secondary | ICD-10-CM

## 2022-11-29 DIAGNOSIS — O43199 Other malformation of placenta, unspecified trimester: Secondary | ICD-10-CM

## 2022-11-29 DIAGNOSIS — Z3480 Encounter for supervision of other normal pregnancy, unspecified trimester: Secondary | ICD-10-CM

## 2022-11-29 DIAGNOSIS — Z6838 Body mass index (BMI) 38.0-38.9, adult: Secondary | ICD-10-CM

## 2022-11-29 DIAGNOSIS — O09299 Supervision of pregnancy with other poor reproductive or obstetric history, unspecified trimester: Secondary | ICD-10-CM

## 2022-11-29 DIAGNOSIS — O09529 Supervision of elderly multigravida, unspecified trimester: Secondary | ICD-10-CM

## 2022-11-29 DIAGNOSIS — Z9889 Other specified postprocedural states: Secondary | ICD-10-CM

## 2022-11-29 NOTE — Progress Notes (Signed)
   PRENATAL VISIT NOTE  Subjective:  [name] is a 37 y.o. G3P1011 at [redacted]w[redacted]d being seen today for ongoing prenatal care.  She is currently monitored for the following issues for this high-risk pregnancy and has H/O myomectomy; Fibroid; Endometriosis; Supervision of other normal pregnancy, antepartum; Pre-diabetes; Obesity affecting pregnancy; History of pre-eclampsia in prior pregnancy, currently pregnant; AMA (advanced maternal age) multigravida 35+; and Placenta succenturiata, antepartum on their problem list.  Patient reports  still having vomiting - about every other day or so. Tries to eat frequently. Has zofran .  Contractions: Not present. Vag. Bleeding: None.  Movement: Present. Denies leaking of fluid.   The following portions of the patient's history were reviewed and updated as appropriate: allergies, current medications, past family history, past medical history, past social history, past surgical history and problem list.   Objective:   Vitals:   11/29/22 1346  BP: 119/76  Pulse: (!) 101  Weight: 224 lb (101.6 kg)    Fetal Status: Fetal Heart Rate (bpm): 145   Movement: Present     General:  Alert, oriented and cooperative. Patient is in no acute distress.  Skin: Skin is warm and dry. No rash noted.   Cardiovascular: Normal heart rate noted  Respiratory: Normal respiratory effort, no problems with respiration noted  Abdomen: Soft, gravid, appropriate for gestational age.  Pain/Pressure: Absent     Pelvic: Cervical exam deferred        Extremities: Normal range of motion.  Edema: None  Mental Status: Normal mood and affect. Normal behavior. Normal judgment and thought content.   Assessment and Plan:  Pregnancy: G3P1011 at [redacted]w[redacted]d 1. Supervision of other normal pregnancy, antepartum FHT normal Declines AFP and NIPs  2. H/O myomectomy Had SVD since removal  3. Fibroid Multiple fibroids  4. Placenta succenturiata, antepartum  5. Antepartum multigravida of  advanced maternal age ASA 81mg  recommended  6. History of pre-eclampsia in prior pregnancy, currently pregnant  7. BMI 38.0-38.9,adult   Preterm labor symptoms and general obstetric precautions including but not limited to vaginal bleeding, contractions, leaking of fluid and fetal movement were reviewed in detail with the patient. Please refer to After Visit Summary for other counseling recommendations.   No follow-ups on file.  Future Appointments  Date Time Provider Department Center  12/26/2022  2:45 PM [facility] NURSE [facility]  12/26/2022  3:00 PM [facility] US1 [facility]  12/27/2022  1:30 PM Milas Hock, MD [facility] None  01/17/2023  1:50 PM Levie Heritage, DO [facility] None    Levie Heritage, DO

## 2022-12-26 ENCOUNTER — Ambulatory Visit: Payer: BC Managed Care – PPO | Admitting: *Deleted

## 2022-12-26 ENCOUNTER — Ambulatory Visit: Payer: BC Managed Care – PPO | Attending: Obstetrics

## 2022-12-26 ENCOUNTER — Other Ambulatory Visit: Payer: Self-pay | Admitting: *Deleted

## 2022-12-26 VITALS — BP 108/66 | HR 89

## 2022-12-26 DIAGNOSIS — O3410 Maternal care for benign tumor of corpus uteri, unspecified trimester: Secondary | ICD-10-CM | POA: Diagnosis present

## 2022-12-26 DIAGNOSIS — O09299 Supervision of pregnancy with other poor reproductive or obstetric history, unspecified trimester: Secondary | ICD-10-CM | POA: Diagnosis present

## 2022-12-26 DIAGNOSIS — O3429 Maternal care due to uterine scar from other previous surgery: Secondary | ICD-10-CM | POA: Diagnosis present

## 2022-12-26 DIAGNOSIS — E669 Obesity, unspecified: Secondary | ICD-10-CM

## 2022-12-26 DIAGNOSIS — O09522 Supervision of elderly multigravida, second trimester: Secondary | ICD-10-CM | POA: Insufficient documentation

## 2022-12-26 DIAGNOSIS — D259 Leiomyoma of uterus, unspecified: Secondary | ICD-10-CM | POA: Diagnosis not present

## 2022-12-26 DIAGNOSIS — O3412 Maternal care for benign tumor of corpus uteri, second trimester: Secondary | ICD-10-CM | POA: Diagnosis not present

## 2022-12-26 DIAGNOSIS — Z3689 Encounter for other specified antenatal screening: Secondary | ICD-10-CM

## 2022-12-26 DIAGNOSIS — O99212 Obesity complicating pregnancy, second trimester: Secondary | ICD-10-CM

## 2022-12-26 DIAGNOSIS — Z3A23 23 weeks gestation of pregnancy: Secondary | ICD-10-CM

## 2022-12-27 ENCOUNTER — Telehealth (INDEPENDENT_AMBULATORY_CARE_PROVIDER_SITE_OTHER): Payer: BC Managed Care – PPO | Admitting: Obstetrics and Gynecology

## 2022-12-27 ENCOUNTER — Encounter: Payer: Self-pay | Admitting: Obstetrics and Gynecology

## 2022-12-27 VITALS — BP 108/66 | HR 89 | Wt 227.0 lb

## 2022-12-27 DIAGNOSIS — O09522 Supervision of elderly multigravida, second trimester: Secondary | ICD-10-CM

## 2022-12-27 DIAGNOSIS — O09292 Supervision of pregnancy with other poor reproductive or obstetric history, second trimester: Secondary | ICD-10-CM

## 2022-12-27 DIAGNOSIS — O43192 Other malformation of placenta, second trimester: Secondary | ICD-10-CM

## 2022-12-27 DIAGNOSIS — O99212 Obesity complicating pregnancy, second trimester: Secondary | ICD-10-CM

## 2022-12-27 DIAGNOSIS — Z3480 Encounter for supervision of other normal pregnancy, unspecified trimester: Secondary | ICD-10-CM

## 2022-12-27 DIAGNOSIS — Z9889 Other specified postprocedural states: Secondary | ICD-10-CM

## 2022-12-27 DIAGNOSIS — O09299 Supervision of pregnancy with other poor reproductive or obstetric history, unspecified trimester: Secondary | ICD-10-CM

## 2022-12-27 DIAGNOSIS — O43199 Other malformation of placenta, unspecified trimester: Secondary | ICD-10-CM

## 2022-12-27 DIAGNOSIS — E669 Obesity, unspecified: Secondary | ICD-10-CM

## 2022-12-27 DIAGNOSIS — Z3A23 23 weeks gestation of pregnancy: Secondary | ICD-10-CM

## 2022-12-27 DIAGNOSIS — R7303 Prediabetes: Secondary | ICD-10-CM

## 2022-12-27 NOTE — Progress Notes (Signed)
OBSTETRICS PRENATAL VIRTUAL VISIT ENCOUNTER NOTE  Provider location: Center for [facility] at [facility]   Patient location: Home  I connected with ROCKELLE KUPERUS on 12/27/22 at  1:30 PM EDT by MyChart Video Encounter and verified that I am speaking with the correct person using two identifiers. I discussed the limitations, risks, security and privacy concerns of performing an evaluation and management service virtually and the availability of in person appointments. I also discussed with the patient that there may be a patient responsible charge related to this service. The patient expressed understanding and agreed to proceed. Subjective:  [name] is a 37 y.o. G3P1011 at [redacted]w[redacted]d being seen today for ongoing prenatal care.  She is currently monitored for the following issues for this low-risk pregnancy and has H/O myomectomy; Fibroid; Endometriosis; Supervision of other normal pregnancy, antepartum; Pre-diabetes; Obesity affecting pregnancy; History of pre-eclampsia in prior pregnancy, currently pregnant; AMA (advanced maternal age) multigravida 35+; and Placenta succenturiata, antepartum on their problem list.  Patient reports nausea - takes zofran prn.   . Vag. Bleeding: None.  Movement: Present. Denies any leaking of fluid.   The following portions of the patient's history were reviewed and updated as appropriate: allergies, current medications, past family history, past medical history, past social history, past surgical history and problem list.   Objective:   Vitals:   12/27/22 1248  BP: 108/66  Pulse: 89  Weight: 227 lb (103 kg)    Fetal Status:     Movement: Present     General:  Alert, oriented and cooperative. Patient is in no acute distress.  Respiratory: Normal respiratory effort, no problems with respiration noted  Mental Status: Normal mood and affect. Normal behavior. Normal judgment and thought content.  Rest of physical exam deferred due to  type of encounter  Imaging: [place] MFM OB FOLLOW UP  Result Date: 12/26/2022 ----------------------------------------------------------------------  OBSTETRICS REPORT                       (Signed Final 12/26/2022 04:07 pm) ---------------------------------------------------------------------- Patient Info  ID #:       [id number]                          D.O.B.:  05/18/1986 (36 yrs)  Name:       [name]                 Visit Date: 12/26/2022 03:19 pm ---------------------------------------------------------------------- Performed By  Attending:        Noralee Space MD        Ref. Address:     [address]  Performed By:     Earley Brooke     [facility]                    BS, RDMS  [facility]  Referred By:      [facility] ---------------------------------------------------------------------- Orders  #  Description                           Code        Ordered By  1  [place] MFM OB FOLLOW UP                   [phone]    Rosana Hoes ----------------------------------------------------------------------  #  Order #                     Accession #                Episode #  1  [id number]                   [id number]                 [id number] ---------------------------------------------------------------------- Indications  Advanced maternal age multigravida 56+,        O84.522  second trimester (37 at [facility])  [redacted] weeks gestation of pregnancy                Z3A.23  Obesity complicating pregnancy, second         O99.212  trimester (PG BMI - 38.5)  Uterine fibroids affecting pregnancy in        O34.12, D25.9  second trimester, antepartum  Declined screening  Antenatal follow-up for nonvisualized fetal    Z36.2  anatomy  ---------------------------------------------------------------------- Vital Signs  BP:          108/66 ---------------------------------------------------------------------- Fetal Evaluation  Num Of Fetuses:         1  Fetal Heart Rate(bpm):  180  Cardiac Activity:       Observed  Presentation:           Cephalic  Placenta:               Post w/ Lt lateral succenturiate lobe  P. Cord Insertion:      Previously seen as normal  Amniotic Fluid  AFI FV:      Within normal limits                              Largest Pocket(cm)                              5.33 ---------------------------------------------------------------------- Biometry  BPD:        61  mm     G. Age:  24w 6d         93  %  CI:        76.84   %    70 - 86                                                          FL/HC:      18.1   %    19.2 - 20.8  HC:      220.4  mm     G. Age:  24w 1d         72  %    HC/AC:      1.13        1.05 - 1.21  AC:      195.7  mm     G. Age:  24w 2d         76  %    FL/BPD:     65.4   %    71 - 87  FL:       39.9  mm     G. Age:  22w 6d         30  %    FL/AC:      20.4   %    20 - 24  HUM:      38.5  mm     G. Age:  23w 5d         52  %  LV:          5  mm  Est. FW:     625  gm      1 lb 6 oz     72  % ---------------------------------------------------------------------- OB History  Gravidity:    3         Term:   1        Prem:   0        SAB:   1  Living:       1 ---------------------------------------------------------------------- Gestational Age  LMP:           24w 2d        Date:  07/09/22                  EDD:   04/15/23  U/S Today:     24w 0d                                        EDD:   04/17/23  Best:          23w 1d     Det. By:  U/S C R L  (09/27/22)    EDD:   04/23/23 ---------------------------------------------------------------------- Anatomy  Cranium:               Appears normal         LVOT:                   Appears normal  Cavum:                 Previously seen        Aortic Arch:            Appears  normal  Ventricles:            Appears  normal         Ductal Arch:            Appears normal  Choroid Plexus:        Previously seen        Diaphragm:              Appears normal  Cerebellum:            Previously seen        Stomach:                Appears normal, left                                                                        sided  Posterior Fossa:       Previously seen        Abdomen:                Appears normal  Nuchal Fold:           Previously seen        Abdominal Wall:         Previously seen  Face:                  Orbits and profile     Cord Vessels:           Previously seen                         previously seen  Lips:                  Previously seen        Kidneys:                Appear normal  Palate:                Not well visualized    Bladder:                Appears normal  Thoracic:              Appears normal         Spine:                  Previously seen  Heart:                 Appears normal         Upper Extremities:      Previously seen                         (4CH, axis, and                         situs)  RVOT:                  Appears normal         Lower Extremities:      Previously seen  Other:  Heels/feet and open hands/5th digits previously visualized. Nasal          bone, mandible and falx visualized. VC, 3VV and 3VTV appear  normal. Technically difficult due to fetal position. ---------------------------------------------------------------------- Cervix Uterus Adnexa  Cervix  Length:            4.4  cm.  Normal appearance by transabdominal scan  Right Ovary  Previously seen  Left Ovary  Previously seen. ---------------------------------------------------------------------- Impression  Patient returned for completion of fetal anatomy .Amniotic  fluid is normal and good fetal activity is seen .Fetal biometry  is consistent with her previously-established dates .Fetal  anatomical survey was completed and appears normal.  Possibility of succenturiate lobe (left  lateral) cannot be ruled  out. ---------------------------------------------------------------------- Recommendations  -An appointment was made for her to return in 8 weeks for  fetal growth assessment. ----------------------------------------------------------------------                 Noralee Space, MD Electronically Signed Final Report   12/26/2022 04:07 pm ----------------------------------------------------------------------    Assessment and Plan:  Pregnancy: G3P1011 at [redacted]w[redacted]d 1. H/O myomectomy Okay for SVD after review of op note  2. Multigravida of advanced maternal age in second trimester Declined amnio  3. History of pre-eclampsia in prior pregnancy, currently pregnant Bp normal thus far. Continue ldASA.   4. Obesity affecting pregnancy in second trimester, unspecified obesity type  5. Placenta succenturiata, antepartum Possible extra lobe of the placenta.   6. Supervision of other normal pregnancy, antepartum Deciding about circ. Info given.  Likely LNG-IUD  7. Pre-diabetes Follow up 2 hr normal  8. Pregnancy with 23 completed weeks gestation   Preterm labor symptoms and general obstetric precautions including but not limited to vaginal bleeding, contractions, leaking of fluid and fetal movement were reviewed in detail with the patient. I discussed the assessment and treatment plan with the patient. The patient was provided an opportunity to ask questions and all were answered. The patient agreed with the plan and demonstrated an understanding of the instructions. The patient was advised to call back or seek an in-person office evaluation/go to [facility] at [facility] for any urgent or concerning symptoms. Please refer to After Visit Summary for other counseling recommendations.   I provided 6 minutes of face-to-face time during this encounter.  No follow-ups on file.  Future Appointments  Date Time Provider Department Center  12/27/2022  1:30 PM Milas Hock,  MD [facility] None  01/17/2023  1:50 PM Levie Heritage, DO [facility] None  02/20/2023  1:15 PM [facility] NURSE [facility]  02/20/2023  1:30 PM [facility] US4 [facility]    Milas Hock, MD Center for [facility], [facility]

## 2022-12-30 ENCOUNTER — Encounter: Payer: Self-pay | Admitting: Obstetrics and Gynecology

## 2022-12-31 ENCOUNTER — Other Ambulatory Visit (HOSPITAL_COMMUNITY)
Admission: RE | Admit: 2022-12-31 | Discharge: 2022-12-31 | Disposition: A | Discharge status: Home / Self Care | Payer: BC Managed Care – PPO | Source: Ambulatory Visit | Attending: Obstetrics and Gynecology | Admitting: Obstetrics and Gynecology

## 2022-12-31 ENCOUNTER — Ambulatory Visit (INDEPENDENT_AMBULATORY_CARE_PROVIDER_SITE_OTHER): Payer: BC Managed Care – PPO | Admitting: Obstetrics and Gynecology

## 2022-12-31 ENCOUNTER — Encounter: Payer: Self-pay | Admitting: Obstetrics and Gynecology

## 2022-12-31 VITALS — BP 104/60 | HR 101 | Wt 226.0 lb

## 2022-12-31 DIAGNOSIS — O23592 Infection of other part of genital tract in pregnancy, second trimester: Secondary | ICD-10-CM | POA: Diagnosis not present

## 2022-12-31 DIAGNOSIS — B9689 Other specified bacterial agents as the cause of diseases classified elsewhere: Secondary | ICD-10-CM

## 2022-12-31 DIAGNOSIS — N898 Other specified noninflammatory disorders of vagina: Secondary | ICD-10-CM | POA: Diagnosis present

## 2022-12-31 DIAGNOSIS — Z3A23 23 weeks gestation of pregnancy: Secondary | ICD-10-CM | POA: Insufficient documentation

## 2022-12-31 DIAGNOSIS — B3731 Acute candidiasis of vulva and vagina: Secondary | ICD-10-CM | POA: Diagnosis not present

## 2022-12-31 DIAGNOSIS — N760 Acute vaginitis: Secondary | ICD-10-CM | POA: Diagnosis not present

## 2022-12-31 DIAGNOSIS — Z3480 Encounter for supervision of other normal pregnancy, unspecified trimester: Secondary | ICD-10-CM

## 2022-12-31 MED ORDER — TERCONAZOLE 0.4 % VA CREA: 1.0000 | TOPICAL_CREAM | Freq: Every day | VAGINAL | 0 refills | Status: DC

## 2022-12-31 NOTE — Progress Notes (Signed)
   PRENATAL VISIT NOTE  Subjective:  [name] is a 37 y.o. G3P1011 at [redacted]w[redacted]d being seen today for ongoing prenatal care.  She is currently monitored for the following issues for this low-risk pregnancy and has H/O myomectomy; Fibroid; Endometriosis; Supervision of other normal pregnancy, antepartum; Pre-diabetes; Obesity affecting pregnancy; History of pre-eclampsia in prior pregnancy, currently pregnant; AMA (advanced maternal age) multigravida 35+; and Placenta succenturiata, antepartum on their problem list.  Patient reports  vaginal odor, discharge and hemorrhoid . She had a dream that her water broke and now will feel better if examined.  Contractions: Not present. Vag. Bleeding: None.  Movement: Present. Denies leaking of fluid.   The following portions of the patient's history were reviewed and updated as appropriate: allergies, current medications, past family history, past medical history, past social history, past surgical history and problem list.   Objective:   Vitals:   12/31/22 1110  BP: 104/60  Pulse: (!) 101  Weight: 226 lb (102.5 kg)    Fetal Status: Fetal Heart Rate (bpm): 154   Movement: Present     General:  Alert, oriented and cooperative. Patient is in no acute distress.  Skin: Skin is warm and dry. No rash noted.   Cardiovascular: Normal heart rate noted  Respiratory: Normal respiratory effort, no problems with respiration noted  Abdomen: Soft, gravid, appropriate for gestational age.  Pain/Pressure: Present     Pelvic: Cervical exam performed in the presence of a chaperone      SSE done- no pooling, thick clumping whitish-yellow discharge. No bleeding. Cervix closed.   Extremities: Normal range of motion.  Edema: None  Mental Status: Normal mood and affect. Normal behavior. Normal judgment and thought content.   Assessment and Plan:  Pregnancy: G3P1011 at [redacted]w[redacted]d 1. Supervision of other normal pregnancy, antepartum Discussed measures to help with  hemorrhoids.   2. [redacted] weeks gestation of pregnancy - Cervicovaginal ancillary only( [place])  3. Vaginal odor Suspect yeast infection. Cultures sent.  - Cervicovaginal ancillary only( [place]) - terconazole (TERAZOL 7) 0.4 % vaginal cream; Place 1 applicator vaginally at bedtime. Use for seven days  Dispense: 45 g; Refill: 0  Preterm labor symptoms and general obstetric precautions including but not limited to vaginal bleeding, contractions, leaking of fluid and fetal movement were reviewed in detail with the patient. Please refer to After Visit Summary for other counseling recommendations.   No follow-ups on file.  Future Appointments  Date Time Provider Department Center  01/15/2023  8:35 AM Corlis Hove, NP [facility] None  02/07/2023  2:30 PM Levie Heritage, DO [facility] None  02/20/2023  1:15 PM [facility] NURSE [facility]  02/20/2023  1:30 PM [facility] US4 [facility]  02/21/2023  2:50 PM Levie Heritage, DO [facility] None    Milas Hock, MD

## 2023-01-01 MED ORDER — METRONIDAZOLE 500 MG PO TABS: 500.0000 mg | ORAL_TABLET | Freq: Two times a day (BID) | ORAL | 0 refills | Status: DC

## 2023-01-01 NOTE — Addendum Note (Signed)
Addended by: Milas Hock A on: 01/01/2023 12:48 PM   Modules accepted: Orders

## 2023-01-15 ENCOUNTER — Ambulatory Visit: Payer: BC Managed Care – PPO | Admitting: Student

## 2023-01-15 ENCOUNTER — Other Ambulatory Visit (HOSPITAL_COMMUNITY)
Admission: RE | Admit: 2023-01-15 | Discharge: 2023-01-15 | Disposition: A | Discharge status: Home / Self Care | Payer: BC Managed Care – PPO | Source: Ambulatory Visit | Attending: Family Medicine | Admitting: Family Medicine

## 2023-01-15 VITALS — BP 108/72 | HR 87 | Wt 226.0 lb

## 2023-01-15 DIAGNOSIS — O26892 Other specified pregnancy related conditions, second trimester: Secondary | ICD-10-CM | POA: Diagnosis present

## 2023-01-15 DIAGNOSIS — O09299 Supervision of pregnancy with other poor reproductive or obstetric history, unspecified trimester: Secondary | ICD-10-CM

## 2023-01-15 DIAGNOSIS — Z9889 Other specified postprocedural states: Secondary | ICD-10-CM

## 2023-01-15 DIAGNOSIS — Z3A26 26 weeks gestation of pregnancy: Secondary | ICD-10-CM

## 2023-01-15 DIAGNOSIS — N898 Other specified noninflammatory disorders of vagina: Secondary | ICD-10-CM

## 2023-01-15 DIAGNOSIS — Z3480 Encounter for supervision of other normal pregnancy, unspecified trimester: Secondary | ICD-10-CM

## 2023-01-15 DIAGNOSIS — R7303 Prediabetes: Secondary | ICD-10-CM

## 2023-01-15 DIAGNOSIS — O09522 Supervision of elderly multigravida, second trimester: Secondary | ICD-10-CM

## 2023-01-15 DIAGNOSIS — O99212 Obesity complicating pregnancy, second trimester: Secondary | ICD-10-CM

## 2023-01-15 NOTE — Progress Notes (Signed)
   PRENATAL VISIT NOTE  Subjective:  [name] is a 37 y.o. G3P1011 at 108w0d being seen today for ongoing prenatal care.  She is currently monitored for the following issues for this low-risk pregnancy and has H/O myomectomy; Fibroid; Endometriosis; Supervision of other normal pregnancy, antepartum; Pre-diabetes; Obesity affecting pregnancy; History of pre-eclampsia in prior pregnancy, currently pregnant; AMA (advanced maternal age) multigravida 35+; and Placenta succenturiata, antepartum on their problem list.  Patient reports no complaints.  Contractions: Not present. Vag. Bleeding: None.  Movement: Present. Denies leaking of fluid.   The following portions of the patient's history were reviewed and updated as appropriate: allergies, current medications, past family history, past medical history, past social history, past surgical history and problem list.   Objective:   Vitals:   01/15/23 0829  BP: 108/72  Pulse: 87  Weight: 226 lb (102.5 kg)    Fetal Status: Fetal Heart Rate (bpm): 152 Fundal Height: 27 cm Movement: Present     General:  Alert, oriented and cooperative. Patient is in no acute distress.  Skin: Skin is warm and dry. No rash noted.   Cardiovascular: Normal heart rate noted  Respiratory: Normal respiratory effort, no problems with respiration noted  Abdomen: Soft, gravid, appropriate for gestational age.  Pain/Pressure: Present     Pelvic: Cervical exam deferred        Extremities: Normal range of motion.  Edema: Trace  Mental Status: Normal mood and affect. Normal behavior. Normal judgment and thought content.   Assessment and Plan:  Pregnancy: G3P1011 at [redacted]w[redacted]d 1. Supervision of other normal pregnancy, antepartum - frequent and vigorous fetal movement  2. [redacted] weeks gestation of pregnancy - gtt today - prefers TDAp at next visit  3. H/O myomectomy - plan for SVD - Multiple fibroids present, f/u growth scans scheduled  4. Multigravida of advanced  maternal age in second trimester - declined fetal aneuploidy screening or diagnostic testing - ASA therapy  5. History of pre-eclampsia in prior pregnancy, currently pregnant - ASA therapy - Bps have been stable in pregnancy   6. Obesity affecting pregnancy in second trimester, unspecified obesity type - Pregravid BMI: 38 - Follow-up growth [place] scheduled  7. Pre-diabetes - GTT today, HA1C - 6.3, normal early GTT  8. Vaginal discharge during pregnancy in second trimester - Continued d/c from prior BV and Yeast - Cervicovaginal ancillary only   Preterm labor symptoms and general obstetric precautions including but not limited to vaginal bleeding, contractions, leaking of fluid and fetal movement were reviewed in detail with the patient. Please refer to After Visit Summary for other counseling recommendations.   Return in about 2 weeks (around 01/29/2023) for LOB, IN-PERSON.  Future Appointments  Date Time Provider Department Center  02/07/2023  2:30 PM Levie Heritage, DO [facility] None  02/20/2023  1:15 PM [facility] NURSE [facility]  02/20/2023  1:30 PM [facility] US4 [facility]  02/21/2023  2:50 PM Adrian Blackwater, Rhona Raider, DO [facility] None    Corlis Hove, NP

## 2023-01-16 LAB — CERVICOVAGINAL ANCILLARY ONLY
Bacterial Vaginitis (gardnerella): POSITIVE — AB
Candida Glabrata: NEGATIVE
Candida Vaginitis: POSITIVE — AB
Comment: NEGATIVE
Comment: NEGATIVE
Comment: NEGATIVE

## 2023-01-16 LAB — GLUCOSE TOLERANCE, 2 HOURS W/ 1HR
Glucose, 1 hour: 156 mg/dL (ref 70–179)
Glucose, 2 hour: 100 mg/dL (ref 70–152)
Glucose, Fasting: 90 mg/dL (ref 70–91)

## 2023-01-16 LAB — CBC
Hematocrit: 33 % — ABNORMAL LOW (ref 34.0–46.6)
Hemoglobin: 10.6 g/dL — ABNORMAL LOW (ref 11.1–15.9)
MCH: 27.9 pg (ref 26.6–33.0)
MCHC: 32.1 g/dL (ref 31.5–35.7)
MCV: 87 fL (ref 79–97)
Platelets: 283 10*3/uL (ref 150–450)
RBC: 3.8 x10E6/uL (ref 3.77–5.28)
RDW: 12.9 % (ref 11.7–15.4)
WBC: 12 10*3/uL — ABNORMAL HIGH (ref 3.4–10.8)

## 2023-01-16 LAB — RPR: RPR Ser Ql: NONREACTIVE

## 2023-01-16 LAB — HIV ANTIBODY (ROUTINE TESTING W REFLEX): HIV Screen 4th Generation wRfx: NONREACTIVE

## 2023-01-17 ENCOUNTER — Encounter: Payer: BC Managed Care – PPO | Admitting: Family Medicine

## 2023-01-18 ENCOUNTER — Other Ambulatory Visit: Payer: Self-pay | Admitting: Student

## 2023-01-18 DIAGNOSIS — B3731 Acute candidiasis of vulva and vagina: Secondary | ICD-10-CM

## 2023-01-18 DIAGNOSIS — N898 Other specified noninflammatory disorders of vagina: Secondary | ICD-10-CM

## 2023-01-18 DIAGNOSIS — O99012 Anemia complicating pregnancy, second trimester: Secondary | ICD-10-CM

## 2023-01-18 DIAGNOSIS — O26892 Other specified pregnancy related conditions, second trimester: Secondary | ICD-10-CM

## 2023-01-18 DIAGNOSIS — B9689 Other specified bacterial agents as the cause of diseases classified elsewhere: Secondary | ICD-10-CM

## 2023-01-18 MED ORDER — FERROUS SULFATE 325 (65 FE) MG PO TBEC: 325.0000 mg | DELAYED_RELEASE_TABLET | ORAL | 1 refills | Status: DC

## 2023-01-18 MED ORDER — TERCONAZOLE 0.4 % VA CREA: 1.0000 | TOPICAL_CREAM | Freq: Every day | VAGINAL | 0 refills | Status: DC

## 2023-01-18 MED ORDER — METRONIDAZOLE 0.75 % VA GEL: 1.0000 | Freq: Every day | VAGINAL | 1 refills | Status: DC

## 2023-02-07 ENCOUNTER — Ambulatory Visit (INDEPENDENT_AMBULATORY_CARE_PROVIDER_SITE_OTHER): Payer: BC Managed Care – PPO | Admitting: Family Medicine

## 2023-02-07 VITALS — BP 110/63 | HR 102 | Wt 227.0 lb

## 2023-02-07 DIAGNOSIS — O43199 Other malformation of placenta, unspecified trimester: Secondary | ICD-10-CM

## 2023-02-07 DIAGNOSIS — Z23 Encounter for immunization: Secondary | ICD-10-CM | POA: Diagnosis not present

## 2023-02-07 DIAGNOSIS — O09299 Supervision of pregnancy with other poor reproductive or obstetric history, unspecified trimester: Secondary | ICD-10-CM

## 2023-02-07 DIAGNOSIS — Z3A29 29 weeks gestation of pregnancy: Secondary | ICD-10-CM

## 2023-02-07 DIAGNOSIS — R7303 Prediabetes: Secondary | ICD-10-CM

## 2023-02-07 DIAGNOSIS — Z9889 Other specified postprocedural states: Secondary | ICD-10-CM

## 2023-02-07 DIAGNOSIS — O09523 Supervision of elderly multigravida, third trimester: Secondary | ICD-10-CM

## 2023-02-07 DIAGNOSIS — Z3480 Encounter for supervision of other normal pregnancy, unspecified trimester: Secondary | ICD-10-CM

## 2023-02-07 NOTE — Progress Notes (Signed)
   PRENATAL VISIT NOTE  Subjective:  [name] is a 37 y.o. G3P1011 at [redacted]w[redacted]d being seen today for ongoing prenatal care.  She is currently monitored for the following issues for this high-risk pregnancy and has H/O myomectomy; Fibroid; Endometriosis; Supervision of other normal pregnancy, antepartum; Pre-diabetes; Obesity affecting pregnancy; History of pre-eclampsia in prior pregnancy, currently pregnant; AMA (advanced maternal age) multigravida 35+; and Placenta succenturiata, antepartum on their problem list.  Patient reports no complaints.  Contractions: Not present. Vag. Bleeding: None.  Movement: Present. Denies leaking of fluid.   The following portions of the patient's history were reviewed and updated as appropriate: allergies, current medications, past family history, past medical history, past social history, past surgical history and problem list.   Objective:   Vitals:   02/07/23 1427  BP: 110/63  Pulse: (!) 102  Weight: 227 lb (103 kg)    Fetal Status:     Movement: Present     General:  Alert, oriented and cooperative. Patient is in no acute distress.  Skin: Skin is warm and dry. No rash noted.   Cardiovascular: Normal heart rate noted  Respiratory: Normal respiratory effort, no problems with respiration noted  Abdomen: Soft, gravid, appropriate for gestational age.  Pain/Pressure: Absent     Pelvic: Cervical exam deferred        Extremities: Normal range of motion.  Edema: Trace  Mental Status: Normal mood and affect. Normal behavior. Normal judgment and thought content.   Assessment and Plan:  Pregnancy: G3P1011 at [redacted]w[redacted]d 1. [redacted] weeks gestation of pregnancy - Tdap vaccine greater than or equal to 7yo IM  2. Supervision of other normal pregnancy, antepartum FHT and FH normal - Tdap vaccine greater than or equal to 7yo IM  3. H/O myomectomy Has had vaginal deliveries after  4. Pre-diabetes Normal GDM testing  5. Multigravida of advanced maternal age in  third trimester ASA 81mg   6. Placenta succenturiata, antepartum  7. History of pre-eclampsia in prior pregnancy, currently pregnant  Preterm labor symptoms and general obstetric precautions including but not limited to vaginal bleeding, contractions, leaking of fluid and fetal movement were reviewed in detail with the patient. Please refer to After Visit Summary for other counseling recommendations.   No follow-ups on file.  Future Appointments  Date Time Provider Department Center  02/20/2023  1:15 PM [facility] NURSE [facility]  02/20/2023  1:30 PM [facility] US4 [facility]  02/21/2023  2:50 PM Levie Heritage, DO [facility] None  03/12/2023 10:35 AM Gerrit Heck, CNM [facility] None  03/21/2023  2:50 PM Levie Heritage, DO [facility] None  04/04/2023  1:10 PM Levie Heritage, DO [facility] None  04/11/2023  1:10 PM Levie Heritage, DO [facility] None  04/17/2023 10:15 AM Adrian Blackwater Rhona Raider, DO [facility] None    Levie Heritage, DO

## 2023-02-14 ENCOUNTER — Encounter: Payer: Self-pay | Admitting: *Deleted

## 2023-02-20 ENCOUNTER — Ambulatory Visit: Payer: BC Managed Care – PPO | Admitting: *Deleted

## 2023-02-20 ENCOUNTER — Ambulatory Visit: Payer: BC Managed Care – PPO | Attending: Obstetrics and Gynecology

## 2023-02-20 VITALS — BP 112/75 | HR 104

## 2023-02-20 DIAGNOSIS — Z3A31 31 weeks gestation of pregnancy: Secondary | ICD-10-CM | POA: Diagnosis not present

## 2023-02-20 DIAGNOSIS — Z3480 Encounter for supervision of other normal pregnancy, unspecified trimester: Secondary | ICD-10-CM | POA: Insufficient documentation

## 2023-02-20 DIAGNOSIS — O3413 Maternal care for benign tumor of corpus uteri, third trimester: Secondary | ICD-10-CM | POA: Insufficient documentation

## 2023-02-20 DIAGNOSIS — D259 Leiomyoma of uterus, unspecified: Secondary | ICD-10-CM | POA: Insufficient documentation

## 2023-02-20 DIAGNOSIS — O43199 Other malformation of placenta, unspecified trimester: Secondary | ICD-10-CM | POA: Insufficient documentation

## 2023-02-20 DIAGNOSIS — Z362 Encounter for other antenatal screening follow-up: Secondary | ICD-10-CM | POA: Diagnosis present

## 2023-02-20 DIAGNOSIS — O09523 Supervision of elderly multigravida, third trimester: Secondary | ICD-10-CM | POA: Insufficient documentation

## 2023-02-20 DIAGNOSIS — Z3689 Encounter for other specified antenatal screening: Secondary | ICD-10-CM | POA: Insufficient documentation

## 2023-02-20 DIAGNOSIS — E669 Obesity, unspecified: Secondary | ICD-10-CM

## 2023-02-20 DIAGNOSIS — O99213 Obesity complicating pregnancy, third trimester: Secondary | ICD-10-CM | POA: Diagnosis not present

## 2023-02-21 ENCOUNTER — Encounter: Payer: BC Managed Care – PPO | Admitting: Family Medicine

## 2023-02-21 ENCOUNTER — Ambulatory Visit: Payer: BC Managed Care – PPO | Admitting: Family Medicine

## 2023-02-21 VITALS — BP 123/82 | HR 98 | Wt 228.0 lb

## 2023-02-21 DIAGNOSIS — O09523 Supervision of elderly multigravida, third trimester: Secondary | ICD-10-CM

## 2023-02-21 DIAGNOSIS — O43199 Other malformation of placenta, unspecified trimester: Secondary | ICD-10-CM

## 2023-02-21 DIAGNOSIS — Z9889 Other specified postprocedural states: Secondary | ICD-10-CM

## 2023-02-21 DIAGNOSIS — O09299 Supervision of pregnancy with other poor reproductive or obstetric history, unspecified trimester: Secondary | ICD-10-CM

## 2023-02-21 DIAGNOSIS — Z3480 Encounter for supervision of other normal pregnancy, unspecified trimester: Secondary | ICD-10-CM

## 2023-02-21 DIAGNOSIS — R7303 Prediabetes: Secondary | ICD-10-CM

## 2023-02-21 NOTE — Progress Notes (Signed)
   PRENATAL VISIT NOTE  Subjective:  [name] is a 37 y.o. G3P1011 at [redacted]w[redacted]d being seen today for ongoing prenatal care.  She is currently monitored for the following issues for this high-risk pregnancy and has H/O myomectomy; Supervision of other normal pregnancy, antepartum; Pre-diabetes; Obesity affecting pregnancy; History of pre-eclampsia in prior pregnancy, currently pregnant; AMA (advanced maternal age) multigravida 35+; and Placenta succenturiata, antepartum on their problem list.  Patient reports no complaints.  Contractions: Irritability. Vag. Bleeding: None.  Movement: Present. Denies leaking of fluid.   The following portions of the patient's history were reviewed and updated as appropriate: allergies, current medications, past family history, past medical history, past social history, past surgical history and problem list.   Objective:   Vitals:   02/21/23 1101  BP: 123/82  Pulse: 98  Weight: 228 lb (103.4 kg)    Fetal Status: Fetal Heart Rate (bpm): 145   Movement: Present     General:  Alert, oriented and cooperative. Patient is in no acute distress.  Skin: Skin is warm and dry. No rash noted.   Cardiovascular: Normal heart rate noted  Respiratory: Normal respiratory effort, no problems with respiration noted  Abdomen: Soft, gravid, appropriate for gestational age.  Pain/Pressure: Absent     Pelvic: Cervical exam deferred        Extremities: Normal range of motion.  Edema: None  Mental Status: Normal mood and affect. Normal behavior. Normal judgment and thought content.   Assessment and Plan:  Pregnancy: G3P1011 at [redacted]w[redacted]d 1. Supervision of other normal pregnancy, antepartum FHT normal  2. H/O myomectomy Vaginal deliveries after myomectomy  3. History of pre-eclampsia in prior pregnancy, currently pregnant BP normal On ASA 81mg   4. Multigravida of advanced maternal age in third trimester  5. Pre-diabetes Normal GDM testing  6. Placenta succenturiata,  antepartum  Preterm labor symptoms and general obstetric precautions including but not limited to vaginal bleeding, contractions, leaking of fluid and fetal movement were reviewed in detail with the patient. Please refer to After Visit Summary for other counseling recommendations.   No follow-ups on file.  Future Appointments  Date Time Provider Department Center  03/12/2023 10:35 AM Gerrit Heck, CNM [facility] None  03/21/2023 10:35 AM Levie Heritage, DO [facility] None  04/04/2023  1:10 PM Levie Heritage, DO [facility] None  04/11/2023  1:10 PM Levie Heritage, DO [facility] None  04/17/2023 10:15 AM Levie Heritage, DO [facility] None    Levie Heritage, DO

## 2023-03-07 ENCOUNTER — Encounter: Payer: BC Managed Care – PPO | Admitting: Family Medicine

## 2023-03-12 ENCOUNTER — Ambulatory Visit (INDEPENDENT_AMBULATORY_CARE_PROVIDER_SITE_OTHER): Payer: BC Managed Care – PPO

## 2023-03-12 VITALS — BP 113/70 | HR 81 | Wt 230.0 lb

## 2023-03-12 DIAGNOSIS — Z3480 Encounter for supervision of other normal pregnancy, unspecified trimester: Secondary | ICD-10-CM

## 2023-03-12 DIAGNOSIS — O99353 Diseases of the nervous system complicating pregnancy, third trimester: Secondary | ICD-10-CM

## 2023-03-12 DIAGNOSIS — G5600 Carpal tunnel syndrome, unspecified upper limb: Secondary | ICD-10-CM

## 2023-03-12 DIAGNOSIS — Z3A34 34 weeks gestation of pregnancy: Secondary | ICD-10-CM

## 2023-03-12 NOTE — Progress Notes (Signed)
   HIGH-RISK PREGNANCY OFFICE VISIT  Patient name: [name] MRN [id number]  Date of birth: Jul 20, 1985 Chief Complaint:   Routine Prenatal Visit  Subjective:   [name] is a 37 y.o. G40P1011 female at [redacted]w[redacted]d with an Estimated Date of Delivery: 04/23/23 being seen today for ongoing management of a high-risk pregnancy aeb has H/O myomectomy; Supervision of other normal pregnancy, antepartum; Pre-diabetes; Obesity affecting pregnancy; History of pre-eclampsia in prior pregnancy, currently pregnant; AMA (advanced maternal age) multigravida 35+; and Placenta succenturiata, antepartum on their problem list.  Patient presents today, one, with carpal tunnel symptoms.  Patient reports numbness in right arm that varies in frequency and duration. She states the  pain is worse at night. Patient endorses fetal movement. Patient denies abdominal cramping or contractions.  Patient denies vaginal concerns including abnormal discharge, leaking of fluid, and bleeding. No issues with urination, constipation, or diarrhea.      Contractions: Irritability.  .  Movement: Present.  Reviewed past medical,surgical, social, obstetrical and family history as well as problem list, medications and allergies.  Objective   Vitals:   03/12/23 1058  BP: 113/70  Pulse: 81  Weight: 230 lb (104.3 kg)  Body mass index is 40.74 kg/m.  Total Weight Gain:13 lb (5.897 kg)         Physical Examination:   General appearance: Well appearing, and in no distress  Mental status: Alert, oriented to person, place, and time  Skin: Warm & dry  Cardiovascular: Normal heart rate noted  Respiratory: Normal respiratory effort, no distress  Abdomen: Soft, gravid, nontender, AGA with Fundal Height: 36 cm  Pelvic: Cervical exam deferred           Extremities: Edema: Trace  Fetal Status: Fetal Heart Rate (bpm): 147  Movement: Present   No results found for this or any previous visit (from the past 24 hour(s)).  Assessment &  Plan:  High-risk pregnancy of a 37 y.o., G3P1011 at [redacted]w[redacted]d with an Estimated Date of Delivery: 04/23/23   1. Supervision of other normal pregnancy, antepartum -Anticipatory guidance for upcoming appts. -Patient to schedule next appt in 2 weeks for an in-person visit. -Educated on GBS bacteria including what it is, why we test, and how and when we treat if needed. -Informed that GC/Ct will also be repeated. -Cervical exam if desired.  -Discussed releasing of results to mychart.   2. [redacted] weeks gestation of pregnancy -Doing well.  -Circumcision consent completed, see separate note.  -  3. Pregnancy related carpal tunnel syndrome in third trimester -Reviewed symptoms and management. -Encouraged usage of wrist splints throughout day.      Meds: No orders of the defined types were placed in this encounter.  Labs/procedures today:  Lab Orders  No laboratory test(s) ordered today     Reviewed: Preterm labor symptoms and general obstetric precautions including but not limited to vaginal bleeding, contractions, leaking of fluid and fetal movement were reviewed in detail with the patient.  All questions were answered.  Follow-up: Return in about 2 weeks (around 03/26/2023) for HROB with GBS.  No orders of the defined types were placed in this encounter.  Cherre Robins MSN, CNM 03/12/2023

## 2023-03-12 NOTE — Progress Notes (Signed)
   CIRCUMCISION CONSENT  [name] expresses desire for circumcision of anticipated female infant with EDD of 04/23/2023 .  Informed that [facility] can perform said procedure and circumcision procedure details discussed.    -It was emphasized that this is an elective procedure.   -Risks and benefits of procedure were reviewed including, but not limited to:  *Benefits include reduction in the rates of urinary tract infection (UTI), penile cancer, some sexually transmitted infections, penile inflammatory, and retractile disorders, as well as easier hygiene.   *Risks include bleeding, infection, injury of glans which may lead to need for additional surgery, penile deformity, or urinary tract issues, unsatisfactory cosmetic appearance and other potential complications related to the procedure.   -Informed that procedure will not be performed if provider deems inappropriate d/t penile size, noted deformity, or unsatisfactory pediatric evaluation. -Patient wants to proceed with circumcision. -Circumcision to be done pending pediatric evaluation of infant after delivery.  -Post circumcision care discussed. -Patient informed that circumcision can be refused despite prior consent. -Patient verbalizes understanding and without current questions or concerns.   Cherre Robins MSN, CNM Advanced Practice Provider, Center for [facility] 03/12/2023 12:08 PM

## 2023-03-21 ENCOUNTER — Encounter: Payer: BC Managed Care – PPO | Admitting: Family Medicine

## 2023-03-21 ENCOUNTER — Ambulatory Visit (INDEPENDENT_AMBULATORY_CARE_PROVIDER_SITE_OTHER): Payer: BC Managed Care – PPO | Admitting: Family Medicine

## 2023-03-21 VITALS — BP 121/82 | HR 79 | Wt 228.0 lb

## 2023-03-21 DIAGNOSIS — Z3480 Encounter for supervision of other normal pregnancy, unspecified trimester: Secondary | ICD-10-CM

## 2023-03-21 DIAGNOSIS — O43199 Other malformation of placenta, unspecified trimester: Secondary | ICD-10-CM

## 2023-03-21 DIAGNOSIS — O09523 Supervision of elderly multigravida, third trimester: Secondary | ICD-10-CM

## 2023-03-21 DIAGNOSIS — O09299 Supervision of pregnancy with other poor reproductive or obstetric history, unspecified trimester: Secondary | ICD-10-CM

## 2023-03-21 NOTE — Progress Notes (Signed)
   PRENATAL VISIT NOTE  Subjective:  [name] is a 37 y.o. G3P1011 at [redacted]w[redacted]d being seen today for ongoing prenatal care.  She is currently monitored for the following issues for this high-risk pregnancy and has H/O myomectomy; Supervision of other normal pregnancy, antepartum; Pre-diabetes; Obesity affecting pregnancy; History of pre-eclampsia in prior pregnancy, currently pregnant; AMA (advanced maternal age) multigravida 35+; and Placenta succenturiata, antepartum on their problem list.  Patient reports no complaints.  Contractions: Not present. Vag. Bleeding: None.  Movement: Present. Denies leaking of fluid.   The following portions of the patient's history were reviewed and updated as appropriate: allergies, current medications, past family history, past medical history, past social history, past surgical history and problem list.   Objective:   Vitals:   03/21/23 1045  BP: 121/82  Pulse: 79  Weight: 228 lb (103.4 kg)    Fetal Status: Fetal Heart Rate (bpm): 147   Movement: Present     General:  Alert, oriented and cooperative. Patient is in no acute distress.  Skin: Skin is warm and dry. No rash noted.   Cardiovascular: Normal heart rate noted  Respiratory: Normal respiratory effort, no problems with respiration noted  Abdomen: Soft, gravid, appropriate for gestational age.  Pain/Pressure: Absent     Pelvic: Cervical exam deferred        Extremities: Normal range of motion.  Edema: None  Mental Status: Normal mood and affect. Normal behavior. Normal judgment and thought content.   Assessment and Plan:  Pregnancy: G3P1011 at [redacted]w[redacted]d 1. Supervision of other normal pregnancy, antepartum FHT and FH normal  2. Multigravida of advanced maternal age in third trimester ASA 81mg   3. History of pre-eclampsia in prior pregnancy, currently pregnant BP normal  4. Placenta succenturiata, antepartum   Preterm labor symptoms and general obstetric precautions including but not  limited to vaginal bleeding, contractions, leaking of fluid and fetal movement were reviewed in detail with the patient. Please refer to After Visit Summary for other counseling recommendations.   No follow-ups on file.  Future Appointments  Date Time Provider Department Center  04/04/2023  1:10 PM Levie Heritage, DO [facility] None  04/11/2023  1:10 PM Levie Heritage, DO [facility] None  04/17/2023 10:15 AM Levie Heritage, DO [facility] None    Levie Heritage, DO

## 2023-04-04 ENCOUNTER — Ambulatory Visit (INDEPENDENT_AMBULATORY_CARE_PROVIDER_SITE_OTHER): Payer: BC Managed Care – PPO | Admitting: Family Medicine

## 2023-04-04 ENCOUNTER — Other Ambulatory Visit (HOSPITAL_COMMUNITY)
Admission: RE | Admit: 2023-04-04 | Discharge: 2023-04-04 | Disposition: A | Discharge status: Home / Self Care | Payer: BC Managed Care – PPO | Source: Ambulatory Visit | Attending: Family Medicine | Admitting: Family Medicine

## 2023-04-04 VITALS — BP 125/79 | HR 75 | Wt 228.0 lb

## 2023-04-04 DIAGNOSIS — Z3480 Encounter for supervision of other normal pregnancy, unspecified trimester: Secondary | ICD-10-CM | POA: Diagnosis present

## 2023-04-04 DIAGNOSIS — O43199 Other malformation of placenta, unspecified trimester: Secondary | ICD-10-CM

## 2023-04-04 DIAGNOSIS — Z9889 Other specified postprocedural states: Secondary | ICD-10-CM

## 2023-04-04 DIAGNOSIS — O09299 Supervision of pregnancy with other poor reproductive or obstetric history, unspecified trimester: Secondary | ICD-10-CM

## 2023-04-04 DIAGNOSIS — O09523 Supervision of elderly multigravida, third trimester: Secondary | ICD-10-CM

## 2023-04-04 NOTE — Progress Notes (Signed)
   PRENATAL VISIT NOTE  Subjective:  [name] is a 37 y.o. G3P1011 at [redacted]w[redacted]d being seen today for ongoing prenatal care.  She is currently monitored for the following issues for this low-risk pregnancy and has H/O myomectomy; Supervision of other normal pregnancy, antepartum; Pre-diabetes; Obesity affecting pregnancy; History of pre-eclampsia in prior pregnancy, currently pregnant; AMA (advanced maternal age) multigravida 35+; and Placenta succenturiata, antepartum on their problem list.  Patient reports no complaints.  Contractions: Irritability. Vag. Bleeding: None.  Movement: Present. Denies leaking of fluid.   The following portions of the patient's history were reviewed and updated as appropriate: allergies, current medications, past family history, past medical history, past social history, past surgical history and problem list.   Objective:   Vitals:   04/04/23 1312  BP: 125/79  Pulse: 75  Weight: 228 lb (103.4 kg)    Fetal Status:     Movement: Present     General:  Alert, oriented and cooperative. Patient is in no acute distress.  Skin: Skin is warm and dry. No rash noted.   Cardiovascular: Normal heart rate noted  Respiratory: Normal respiratory effort, no problems with respiration noted  Abdomen: Soft, gravid, appropriate for gestational age.  Pain/Pressure: Present     Pelvic:       Chaperone present  Extremities: Normal range of motion.  Edema: Trace  Mental Status: Normal mood and affect. Normal behavior. Normal judgment and thought content.   Assessment and Plan:  Pregnancy: G3P1011 at [redacted]w[redacted]d 1. Supervision of other normal pregnancy, antepartum FHT normal - Culture, beta strep (group b only) - GC/Chlamydia probe amp ([place])not at [facility]  2. H/O myomectomy Has h/o SVD after myomectomy  3. Multigravida of advanced maternal age in third trimester ASA 81mg   4. History of pre-eclampsia in prior pregnancy, currently pregnant BP normal  5. Placenta  succenturiata, antepartum   Preterm labor symptoms and general obstetric precautions including but not limited to vaginal bleeding, contractions, leaking of fluid and fetal movement were reviewed in detail with the patient. Please refer to After Visit Summary for other counseling recommendations.   No follow-ups on file.  Future Appointments  Date Time Provider Department Center  04/11/2023  1:10 PM Levie Heritage, DO [facility] None  04/17/2023 10:15 AM Levie Heritage, DO [facility] None    Levie Heritage, DO

## 2023-04-05 LAB — GC/CHLAMYDIA PROBE AMP (~~LOC~~) NOT AT ARMC
Chlamydia: NEGATIVE
Comment: NEGATIVE
Comment: NORMAL
Neisseria Gonorrhea: NEGATIVE

## 2023-04-07 LAB — CULTURE, BETA STREP (GROUP B ONLY): Strep Gp B Culture: POSITIVE — AB

## 2023-04-08 ENCOUNTER — Encounter: Payer: Self-pay | Admitting: Family Medicine

## 2023-04-08 DIAGNOSIS — O99820 Streptococcus B carrier state complicating pregnancy: Secondary | ICD-10-CM | POA: Insufficient documentation

## 2023-04-11 ENCOUNTER — Ambulatory Visit: Payer: BC Managed Care – PPO | Admitting: Family Medicine

## 2023-04-11 VITALS — BP 120/82 | HR 78 | Wt 231.0 lb

## 2023-04-11 DIAGNOSIS — O43199 Other malformation of placenta, unspecified trimester: Secondary | ICD-10-CM

## 2023-04-11 DIAGNOSIS — O09523 Supervision of elderly multigravida, third trimester: Secondary | ICD-10-CM

## 2023-04-11 DIAGNOSIS — O09299 Supervision of pregnancy with other poor reproductive or obstetric history, unspecified trimester: Secondary | ICD-10-CM

## 2023-04-11 DIAGNOSIS — Z3A38 38 weeks gestation of pregnancy: Secondary | ICD-10-CM

## 2023-04-11 DIAGNOSIS — O99820 Streptococcus B carrier state complicating pregnancy: Secondary | ICD-10-CM

## 2023-04-11 DIAGNOSIS — O43193 Other malformation of placenta, third trimester: Secondary | ICD-10-CM

## 2023-04-11 DIAGNOSIS — Z3480 Encounter for supervision of other normal pregnancy, unspecified trimester: Secondary | ICD-10-CM

## 2023-04-11 DIAGNOSIS — O09293 Supervision of pregnancy with other poor reproductive or obstetric history, third trimester: Secondary | ICD-10-CM

## 2023-04-11 DIAGNOSIS — Z9889 Other specified postprocedural states: Secondary | ICD-10-CM

## 2023-04-11 NOTE — Progress Notes (Signed)
   PRENATAL VISIT NOTE  Subjective:  [name] is a 37 y.o. G3P1011 at [redacted]w[redacted]d being seen today for ongoing prenatal care.  She is currently monitored for the following issues for this high-risk pregnancy and has H/O myomectomy; Supervision of other normal pregnancy, antepartum; Pre-diabetes; Obesity affecting pregnancy; History of pre-eclampsia in prior pregnancy, currently pregnant; AMA (advanced maternal age) multigravida 35+; Placenta succenturiata, antepartum; and GBS (group B Streptococcus carrier), +RV culture, currently pregnant on their problem list.  Patient reports occasional contractions.  Contractions: Irritability. Vag. Bleeding: None.  Movement: Present. Denies leaking of fluid.   The following portions of the patient's history were reviewed and updated as appropriate: allergies, current medications, past family history, past medical history, past social history, past surgical history and problem list.   Objective:   Vitals:   04/11/23 1307  BP: 120/82  Pulse: 78  Weight: 231 lb (104.8 kg)    Fetal Status: Fetal Heart Rate (bpm): 140 Fundal Height: 38 cm Movement: Present  Presentation: Vertex  General:  Alert, oriented and cooperative. Patient is in no acute distress.  Skin: Skin is warm and dry. No rash noted.   Cardiovascular: Normal heart rate noted  Respiratory: Normal respiratory effort, no problems with respiration noted  Abdomen: Soft, gravid, appropriate for gestational age.  Pain/Pressure: Present     Pelvic: Cervical exam performed in the presence of a chaperone Dilation: 4 Effacement (%): 50 Station: Ballotable  Extremities: Normal range of motion.  Edema: Trace  Mental Status: Normal mood and affect. Normal behavior. Normal judgment and thought content.   Assessment and Plan:  Pregnancy: G3P1011 at [redacted]w[redacted]d 1. [redacted] weeks gestation of pregnancy  2. Supervision of other normal pregnancy, antepartum FHT normal  3. History of pre-eclampsia in prior  pregnancy, currently pregnant BP normal today Did have headache with elevated bp a couple days ago - resolved. If this happens again, patient instructed to go to hospital  4. H/O myomectomy Has nsvd after myomectomy  5. Multigravida of advanced maternal age in third trimester On ASA   6. GBS (group B Streptococcus carrier), +RV culture, currently pregnant Intrapartum ppx  7. Placenta succenturiata, antepartum   Term labor symptoms and general obstetric precautions including but not limited to vaginal bleeding, contractions, leaking of fluid and fetal movement were reviewed in detail with the patient. Please refer to After Visit Summary for other counseling recommendations.   No follow-ups on file.  Future Appointments  Date Time Provider Department Center  04/17/2023 10:15 AM Levie Heritage, DO [facility] None    Levie Heritage, DO

## 2023-04-17 ENCOUNTER — Encounter: Payer: Self-pay | Admitting: Family Medicine

## 2023-04-17 ENCOUNTER — Encounter: Payer: BC Managed Care – PPO | Admitting: Family Medicine

## 2023-04-23 ENCOUNTER — Inpatient Hospital Stay (HOSPITAL_COMMUNITY): Admit: 2023-04-23 | Payer: BC Managed Care – PPO

## 2023-04-25 ENCOUNTER — Encounter: Payer: BC Managed Care – PPO | Admitting: Family Medicine

## 2023-04-25 ENCOUNTER — Ambulatory Visit: Payer: BC Managed Care – PPO | Admitting: Family Medicine

## 2023-04-25 ENCOUNTER — Encounter: Payer: Self-pay | Admitting: Family Medicine

## 2023-04-25 VITALS — BP 128/88 | HR 86 | Ht 63.0 in | Wt 223.0 lb

## 2023-04-25 DIAGNOSIS — O43199 Other malformation of placenta, unspecified trimester: Secondary | ICD-10-CM

## 2023-04-25 NOTE — Progress Notes (Signed)
   Subjective:    Patient ID: [name], female    DOB: Feb 17, 1986, 37 y.o.   MRN: [id number]  HPI Patient seen for postpartum check after vaginal delivery.  She had a succenturiate lobe and had to have a manual extraction of the placenta.  Per delivery note in care everywhere, the send came out in pieces.  Ultrasound afterwards seemed to show that everything was retrieved.,  But noted calcified fibroid where the succenturiate lobe was thought to be.  Has some mild bleeding but no pelvic pain, fevers, chills, nausea.  She was also given Lasix due to shortness of breath and some pulmonary congestion.  She has finished this.  And feels much better Review of Systems     Objective:  BP 128/88   Pulse 86   Ht 5\' 3"  (1.6 m)   Wt 223 lb (101.2 kg)   LMP 07/09/2022   Breastfeeding Yes   BMI 39.50 kg/m    Physical Exam Vitals reviewed.  Constitutional:      Appearance: Normal appearance.  Cardiovascular:     Rate and Rhythm: Normal rate and regular rhythm.     Pulses: Normal pulses.  Pulmonary:     Effort: Pulmonary effort is normal.     Breath sounds: Normal breath sounds.  Skin:    General: Skin is warm and dry.     Capillary Refill: Capillary refill takes less than 2 seconds.  Neurological:     General: No focal deficit present.     Mental Status: She is alert.  Psychiatric:        Mood and Affect: Mood normal.        Behavior: Behavior normal.        Thought Content: Thought content normal.        Judgment: Judgment normal.        Assessment & Plan:  1. Placenta succenturiata, antepartum Will check ultrasound to reevaluate uterus and to ensure no retained products. Warning signs of infection given as well as when to follow-up in the emergency department at the hospital - US PELVIC COMPLETE WITH TRANSVAGINAL; Future  2. Postpartum hemorrhage, unspecified type  - US PELVIC COMPLETE WITH TRANSVAGINAL; Future

## 2023-04-25 NOTE — Progress Notes (Deleted)
Post Partum Visit Note  [name] is a 37 y.o. G72P1011 female who presents for a postpartum visit. She is {1-10:13787} {time; units:18646} postpartum following a {method of delivery:313099}.  I have fully reviewed the prenatal and intrapartum course. The delivery was at *** gestational weeks.  Anesthesia: {anesthesia types:812}. Postpartum course has been ***. Baby is doing well***. Baby is feeding by {breastmilk/bottle:69}. Bleeding {vag bleed:12292}. Bowel function is {normal:32111}. Bladder function is {normal:32111}. Patient {is/is not:9024} sexually active. Contraception method is {contraceptive method:5051}. Postpartum depression screening: {gen negative/positive:315881}.   The pregnancy intention screening data noted above was reviewed. Potential methods of contraception were discussed. The patient elected to proceed with No data recorded.   Edinburgh Postnatal Depression Scale - 04/25/23 1342       Edinburgh Postnatal Depression Scale:  In the Past 7 Days   I have been able to laugh and see the funny side of things. 0    I have looked forward with enjoyment to things. 0    I have blamed myself unnecessarily when things went wrong. 0    I have been anxious or worried for no good reason. 0    I have felt scared or panicky for no good reason. 0    Things have been getting on top of me. 0    I have been so unhappy that I have had difficulty sleeping. 0    I have felt sad or miserable. 0    I have been so unhappy that I have been crying. 0    The thought of harming myself has occurred to me. 0    Edinburgh Postnatal Depression Scale Total 0             Health Maintenance Due  Topic Date Due   INFLUENZA VACCINE  12/20/2022   COVID-19 Vaccine (1 - 2023-24 season) Never done    {Common ambulatory SmartLinks:19316}  Review of Systems {ros; complete:30496}  Objective:  BP (!) 131/94   Pulse 90   Ht 5\' 3"  (1.6 m)   Wt 223 lb (101.2 kg)   LMP 07/09/2022    Breastfeeding Yes   BMI 39.50 kg/m    General:  {gen appearance:16600}   Breasts:  {desc; normal/abnormal/not indicated:14647}  Lungs: {lung exam:16931}  Heart:  {heart exam:5510}  Abdomen: {abdomen exam:16834}   Wound {Wound assessment:11097}  GU exam:  {desc; normal/abnormal/not indicated:14647}       Assessment:    There are no diagnoses linked to this encounter.  *** postpartum exam.   Plan:   Essential components of care per ACOG recommendations:  1.  Mood and well being: Patient with {gen negative/positive:315881} depression screening today. Reviewed local resources for support.  - Patient tobacco use? {tobacco use:25506}  - hx of drug use? {yes/no:25505}    2. Infant care and feeding:  -Patient currently breastmilk feeding? {yes/no:25502}  -Social determinants of health (SDOH) reviewed in EPIC. No concerns***The following needs were identified***  3. Sexuality, contraception and birth spacing - Patient {DOES_DOES WGN:56213} want a pregnancy in the next year.  Desired family size is {NUMBER 1-10:22536} children.  - Reviewed reproductive life planning. Reviewed contraceptive methods based on pt preferences and effectiveness.  Patient desired {Upstream End Methods:24109} today.   - Discussed birth spacing of 18 months  4. Sleep and fatigue -Encouraged family/partner/community support of 4 hrs of uninterrupted sleep to help with mood and fatigue  5. Physical Recovery  - Discussed patients delivery and complications. She describes her labor as {  description:25511} - Patient had a {CHL AMB DELIVERY:[phone]}. Patient had a {laceration:25518} laceration. Perineal healing reviewed. Patient expressed understanding - Patient has urinary incontinence? {yes/no:25515} - Patient {ACTION; IS/IS AOZ:30865784} safe to resume physical and sexual activity  6.  Health Maintenance - HM due items addressed {Yes or If no, why not?:20788} - Last pap smear  Diagnosis  Date Value Ref  Range Status  01/11/2022   Final   - Negative for intraepithelial lesion or malignancy (NILM)   Pap smear {done:10129} at today's visit.  -Breast Cancer screening indicated? {indicated:25516}  7. Chronic Disease/Pregnancy Condition follow up: {Follow up:25499}  - PCP follow up  Leola Brazil, RN Center for [employer], [facility]

## 2023-04-27 ENCOUNTER — Encounter (HOSPITAL_BASED_OUTPATIENT_CLINIC_OR_DEPARTMENT_OTHER): Payer: Self-pay | Admitting: Radiology

## 2023-04-27 ENCOUNTER — Ambulatory Visit (HOSPITAL_BASED_OUTPATIENT_CLINIC_OR_DEPARTMENT_OTHER)
Admission: RE | Admit: 2023-04-27 | Discharge: 2023-04-27 | Disposition: A | Discharge status: Home / Self Care | Payer: BC Managed Care – PPO | Source: Ambulatory Visit | Attending: Family Medicine | Admitting: Family Medicine

## 2023-04-27 DIAGNOSIS — O43199 Other malformation of placenta, unspecified trimester: Secondary | ICD-10-CM | POA: Diagnosis present

## 2023-04-29 ENCOUNTER — Encounter: Payer: Self-pay | Admitting: Family Medicine

## 2023-04-30 ENCOUNTER — Encounter: Payer: Self-pay | Admitting: Family Medicine

## 2023-05-04 ENCOUNTER — Encounter: Payer: Self-pay | Admitting: Family Medicine

## 2023-05-06 ENCOUNTER — Other Ambulatory Visit: Payer: Self-pay | Admitting: Obstetrics and Gynecology

## 2023-05-06 ENCOUNTER — Inpatient Hospital Stay (HOSPITAL_COMMUNITY): Payer: BC Managed Care – PPO

## 2023-05-06 ENCOUNTER — Inpatient Hospital Stay (HOSPITAL_COMMUNITY)
Admission: AD | Admit: 2023-05-06 | Discharge: 2023-05-06 | Disposition: A | Discharge status: Home / Self Care | Payer: BC Managed Care – PPO | Attending: Obstetrics and Gynecology | Admitting: Obstetrics and Gynecology

## 2023-05-06 ENCOUNTER — Telehealth: Payer: Self-pay

## 2023-05-06 ENCOUNTER — Other Ambulatory Visit: Payer: Self-pay

## 2023-05-06 DIAGNOSIS — D219 Benign neoplasm of connective and other soft tissue, unspecified: Secondary | ICD-10-CM

## 2023-05-06 DIAGNOSIS — O9089 Other complications of the puerperium, not elsewhere classified: Secondary | ICD-10-CM | POA: Insufficient documentation

## 2023-05-06 DIAGNOSIS — R252 Cramp and spasm: Secondary | ICD-10-CM | POA: Insufficient documentation

## 2023-05-06 DIAGNOSIS — D259 Leiomyoma of uterus, unspecified: Secondary | ICD-10-CM | POA: Diagnosis not present

## 2023-05-06 DIAGNOSIS — N898 Other specified noninflammatory disorders of vagina: Secondary | ICD-10-CM | POA: Insufficient documentation

## 2023-05-06 DIAGNOSIS — N939 Abnormal uterine and vaginal bleeding, unspecified: Secondary | ICD-10-CM | POA: Diagnosis not present

## 2023-05-06 DIAGNOSIS — R102 Pelvic and perineal pain: Secondary | ICD-10-CM | POA: Insufficient documentation

## 2023-05-06 LAB — SAMPLE TO BLOOD BANK

## 2023-05-06 LAB — CBC
HCT: 34.6 % — ABNORMAL LOW (ref 36.0–46.0)
Hemoglobin: 10.9 g/dL — ABNORMAL LOW (ref 12.0–15.0)
MCH: 28.2 pg (ref 26.0–34.0)
MCHC: 31.5 g/dL (ref 30.0–36.0)
MCV: 89.4 fL (ref 80.0–100.0)
Platelets: 413 10*3/uL — ABNORMAL HIGH (ref 150–400)
RBC: 3.87 MIL/uL (ref 3.87–5.11)
RDW: 15 % (ref 11.5–15.5)
WBC: 11.8 10*3/uL — ABNORMAL HIGH (ref 4.0–10.5)
nRBC: 0 % (ref 0.0–0.2)

## 2023-05-06 MED ORDER — AMOXICILLIN-POT CLAVULANATE 875-125 MG PO TABS
1.0000 | ORAL_TABLET | Freq: Two times a day (BID) | ORAL | 1 refills | Status: DC
Start: 2023-05-06 — End: 2024-01-31

## 2023-05-06 MED ORDER — IBUPROFEN 600 MG PO TABS
600.0000 mg | ORAL_TABLET | Freq: Once | ORAL | Status: AC
  Administered 2023-05-06: 600 mg via ORAL
  Filled 2023-05-06: qty 1

## 2023-05-06 NOTE — Telephone Encounter (Signed)
Called patient - temp is 99 degrees. Increasing bleeding and pelvic pain. WIll give augmentin and will get scheduled for hysteroscopy and D&E with next available surgeon.

## 2023-05-06 NOTE — MAU Provider Note (Signed)
Chief Complaint:  Vaginal Bleeding and Abdominal Pain   Event Date/Time   First Provider Initiated Contact with Patient 05/06/23 2203       HPI: [name] is a 37 y.o. G3P1011 who presents to maternity admissions reporting heavy vaginal bleeding and uterine cramping.  Had a vaginal delivery 3.5 wks ago with subsequent treatment for endometritis a week later.  Followup US in office showed retained tissue. Dr Adrian Blackwater is scheduling a hysteroscopy and D&C sometime this week. . She reports vaginal bleeding, denies urinary symptoms, h/a, dizziness, n/v, or fever/chills.    After arrival here, she passed a white mass, approximately 3-4cm around, appears to be a double fibroid.  Vaginal Bleeding The patient's primary symptoms include pelvic pain and vaginal bleeding. The patient's pertinent negatives include no genital itching. The problem has been unchanged. The pain is moderate. She is not pregnant. Associated symptoms include abdominal pain. Pertinent negatives include no chills. The vaginal discharge was bloody. The vaginal bleeding is heavier than menses. She has been passing clots. She has been passing tissue.  Abdominal Pain This is a recurrent problem. The quality of the pain is cramping. Nothing aggravates the pain. The pain is relieved by Nothing.    RN note: [name] is a 37 y.o. at [redacted]w[redacted]d here in MAU reporting: PP vaginal delivery on 11/22 at [facility] - hard time delivering placenta. Was readmitted to [facility] on thanksgiving for infection in uterus and d/c home that following Sunday. Had follow up with Dr. Adrian Blackwater and U/S - need surgery to see what is left in uterus. Saturday had chills, fatigue, aching bones, increase bleeding and low grade fever. Thought things were subsiding, but today bleeding has increased along with extreme abdominal pain -  sharp/shooting/cramping.   Past Medical History: Past Medical History:  Diagnosis Date   Endometriosis    Fibroid 04/02/2016   Hx  Myomectomy in 2014  [ x ] Review Op Note by MD to evaluate for need for C/S  05/08/2018 Op note reviewed. Per note the uterine incision was transverse. The endometrium was not entered and the fibroid was in the lower uterine segment. Based on this documentation, a vag delivery can be attempted.    Carolyn L. Harraway-Smith, M.D., FACOG        Ovarian cyst    Preeclampsia, third trimester 11/27/2018   Uterine fibroid    Yeast vaginitis 10/09/2018    Past obstetric history: OB History  Gravida Para Term Preterm AB Living  3 1 1  0 1 1  SAB IAB Ectopic Multiple Live Births  1 0 0 0 1    # Outcome Date GA Lbr Len/2nd Weight Sex Type Anes PTL Lv  3 Gravida           2 Term 11/29/18 [redacted]w[redacted]d / 03:13 3150 g F Vag-Spont EPI  LIV     Birth Comments: WDL  1 SAB             Past Surgical History: Past Surgical History:  Procedure Laterality Date   APPENDECTOMY  03/15/11   ROBOT ASSISTED MYOMECTOMY N/A 12/11/2012   Procedure: ROBOTIC ASSISTED MYOMECTOMY;  Surgeon: Serita Kyle, MD;  Location: [facility] ORS;  Service: Gynecology;  Laterality: N/A;    Family History: Family History  Problem Relation Age of Onset   Cancer Maternal Aunt        pt unaware of what kind   Cancer Maternal Grandmother        pt unawaure of what  kinf   Asthma Neg Hx    COPD Neg Hx    Diabetes Neg Hx    Heart disease Neg Hx    Hypertension Neg Hx     Social History: Social History   Tobacco Use   Smoking status: Never   Smokeless tobacco: Never  Vaping Use   Vaping status: Never Used  Substance Use Topics   Alcohol use: No   Drug use: No    Allergies: No Known Allergies  Meds:  Medications Prior to Admission  Medication Sig Dispense Refill Last Dose/Taking   ferrous sulfate 325 (65 FE) MG EC tablet Take 1 tablet (325 mg total) by mouth every other day. 45 tablet 1 05/06/2023   acetaminophen (TYLENOL) 500 MG tablet Take by mouth.      amoxicillin-clavulanate (AUGMENTIN) 875-125 MG tablet Take 1  tablet by mouth 2 (two) times daily. 20 tablet 1    aspirin 81 MG chewable tablet Chew 2 tablets (162 mg total) by mouth daily. (Patient not taking: Reported on 04/25/2023) 180 tablet 3    ibuprofen (ADVIL) 600 MG tablet Take by mouth.      ondansetron (ZOFRAN-ODT) 4 MG disintegrating tablet Take 1 tablet (4 mg total) by mouth every 6 (six) hours as needed for nausea. (Patient not taking: Reported on 02/20/2023) 20 tablet 3    Prenatal Vit-Fe Fumarate-FA (PRENATAL VITAMINS PO) Take by mouth.       I have reviewed patient's Past Medical Hx, Surgical Hx, Family Hx, Social Hx, medications and allergies.  ROS:  Review of Systems  Constitutional:  Negative for chills.  Gastrointestinal:  Positive for abdominal pain.  Genitourinary:  Positive for pelvic pain and vaginal bleeding.   Other systems negative     Physical Exam  Patient Vitals for the past 24 hrs:  BP Temp Temp src Pulse Resp SpO2 Height Weight  05/06/23 2202 129/86 99.1 F (37.3 C) Oral 89 19 -- -- --  05/06/23 2115 137/88 99.7 F (37.6 C) Oral 96 19 100 % 5\' 3"  (1.6 m) 97.9 kg   Constitutional: Well-developed, well-nourished female in no acute distress.  Cardiovascular: normal rate Respiratory: normal effort, no distress.  GI: Abd soft, non-tender.  Nondistended.  No rebound, No guarding.   MS: Extremities nontender, no edema, normal ROM Neurologic: Alert and oriented x 4.   Grossly nonfocal. GU: Neg CVAT. Skin:  Warm and Dry Psych:  Affect appropriate.  PELVIC EXAM: Moderate amount vaginal bleeding  Internal exam deferred in lieu of ultrasound    Labs: Results for orders placed or performed during the hospital encounter of 05/06/23 (from the past 24 hours)  Sample to Blood Bank     Status: None   Collection Time: 05/06/23  9:53 PM  Result Value Ref Range   Blood Bank Specimen SAMPLE AVAILABLE FOR TESTING    Sample Expiration      05/09/2023,2359 Performed at [facility], [address], [place],  [place] [address]   CBC     Status: Abnormal   Collection Time: 05/06/23  9:57 PM  Result Value Ref Range   WBC 11.8 (H) 4.0 - 10.5 K/uL   RBC 3.87 3.87 - 5.11 MIL/uL   Hemoglobin 10.9 (L) 12.0 - 15.0 g/dL   HCT 60.4 (L) 54.0 - 98.1 %   MCV 89.4 80.0 - 100.0 fL   MCH 28.2 26.0 - 34.0 pg   MCHC 31.5 30.0 - 36.0 g/dL   RDW 19.1 47.8 - 29.5 %  Platelets 413 (H) 150 - 400 K/uL   nRBC 0.0 0.0 - 0.2 %   Last Hemoglobin on 04/21/23 was 9.2  O/Positive/-- (05/09 1432)  Imaging:  US PELVIC COMPLETE WITH TRANSVAGINAL Result Date: 05/07/2023 CLINICAL DATA:  Vaginal delivery 04/12/2023 with persistent postnatal hemorrhage and pelvic pain. Possible retained products of conception noted on prior sonogram of 04/27/2023. Recurrent pelvic pain and vaginal bleeding EXAM: TRANSABDOMINAL AND TRANSVAGINAL ULTRASOUND OF PELVIS TECHNIQUE: Both transabdominal and transvaginal ultrasound examinations of the pelvis were performed. Transabdominal technique was performed for global imaging of the pelvis including uterus, ovaries, adnexal regions, and pelvic cul-de-sac. It was necessary to proceed with endovaginal exam following the transabdominal exam to visualize the endometrium and ovaries bilaterally. COMPARISON:  04/27/2023, 02/20/2023, 04/27/2021 FINDINGS: Uterus Measurements: 15.2 x 9.1 x 10.8 cm = volume: 788 mL. The uterus is anteverted. The cervix is incompletely visualized, however, the visualized portion is closed and is unremarkable. No intramural intrauterine masses are identified. Endometrium Thickness: 5.3 cm. Similar prior examination is heterogeneous echogenic soft tissue within the endometrial cavity measuring 6.0 x 5.2 x 5.9 cm in keeping with probable retained products of conception. Right ovary Measurements: 4.6 x 3.7 x 3.2 cm = volume: 29 mL. Normal appearance/no adnexal mass. Left ovary Measurements: 3.2 x 2.6 x 3.6 cm = volume: 16 mL. Normal appearance/no adnexal mass. Other findings No abnormal free  fluid. IMPRESSION: 1. Persistent heterogeneous echogenic soft tissue within the endometrial cavity measuring 6.0 x 5.2 x 5.9 cm in keeping with probable retained products of conception. Electronically Signed   By: Helyn Numbers M.D.   On: 05/07/2023 00:15     MAU Course/MDM: I have reviewed the triage vital signs and the nursing notes.   Pertinent labs & imaging results that were available during my care of the patient were reviewed by me and considered in my medical decision making (see chart for details).      I have reviewed her medical records including past results, notes and treatments.   I have ordered labs as follows:  CBC showed Hgb higher than last measurement  Imaging ordered: US showed retained products.  Results reviewed.   Consult Dr Para March who also reviewed US findings.  She recommends continuing plan for surgery and recommended messaging the office to that effect. .   Treatments in MAU included Ibuprofen for cramping.   Pt stable at time of discharge.  Assessment: Postpartum Day #25 Retained products of conception with stable hemoglobin Fibroid expulsion (presumed fibroid) Vaginal bleeding Pelvic cramping  Plan: Discharge home Recommend schedule surgery per office this week Ibuprofen prn pain Message sent to office to schedule surgery (if not already done) Bleeding precautions  Encouraged to return here or to other Urgent Care/ED if she develops worsening of symptoms, increase in pain, fever, or other concerning symptoms.   Wynelle Bourgeois CNM, MSN Certified Nurse-Midwife 05/06/2023 10:03 PM

## 2023-05-06 NOTE — Telephone Encounter (Signed)
Called patient back. Patient states she has has increased bleeding since her last office visit. Patient states that on Saturday she was fatigued, cold, had a low grade fever and pain in her lower abdomen. Advised patient to go to [facility] to rule out infection. Understanding was voiced. Gethsemane Fischler l Debara Kamphuis, CMA

## 2023-05-06 NOTE — MAU Note (Signed)
.  [name] is a 37 y.o. at [redacted]w[redacted]d here in MAU reporting: PP vaginal delivery on 11/22 at [facility] - hard time delivering placenta. Was readmitted to [facility] on thanksgiving for infection in uterus and d/c home that following Sunday. Had follow up with Dr. Adrian Blackwater and U/S - need surgery to see what is left in uterus. Saturday had chills, fatigue, aching bones, increase bleeding and low grade fever. Thought things were subsiding, but today bleeding has increased along with extreme abdominal pain -  sharp/shooting/cramping.   Pain score: 8 Vitals:   05/06/23 2115  BP: 137/88  Pulse: 96  Resp: 19  Temp: 99.7 F (37.6 C)  SpO2: 100%     FHT:NA  Lab orders placed from triage:  none

## 2023-05-08 ENCOUNTER — Other Ambulatory Visit: Payer: Self-pay

## 2023-05-08 ENCOUNTER — Encounter (HOSPITAL_COMMUNITY): Payer: Self-pay | Admitting: Obstetrics & Gynecology

## 2023-05-08 LAB — SURGICAL PATHOLOGY

## 2023-05-08 NOTE — Progress Notes (Signed)
PCP - Levie Heritage, DO  Cardiologist -   PPM/ICD - denies Device Orders - n/a Rep Notified - n/a  Chest x-ray - requested from [facility] EKG -  Stress Test -  ECHO - requested from [facility] Cardiac Cath -   CPAP - denies  Dm -denies  Blood Thinner Instructions: denies Aspirin Instructions: n/a  ERAS Protcol - clear liquids until 1045 am  COVID TEST- n/a  Anesthesia review: no  Patient verbally denies any shortness of breath, fever, cough and chest pain during phone call   -------------  SDW INSTRUCTIONS given:  Your procedure is scheduled on May 09, 2023.  Report to [facility] Main Entrance "A" at 11:15 A.M., and check in at the Admitting office.  Call this number if you have problems the morning of surgery:  [phone]   Remember:  Do not eat after midnight the night before your surgery  You may drink clear liquids until 10:45 the morning of your surgery.   Clear liquids allowed are: Water, Non-Citrus Juices (without pulp), Carbonated Beverages, Clear Tea, Black Coffee Only, and Gatorade    Take these medicines the morning of surgery with A SIP OF WATER  acetaminophen (TYLENOL)  amoxicillin-clavulanate (AUGMENTIN)   As of today, STOP taking any Aspirin (unless otherwise instructed by your surgeon) Aleve, Naproxen, Ibuprofen, Motrin, Advil, Goody's, BC's, all herbal medications, fish oil, and all vitamins.                      Do not wear jewelry, make up, or nail polish            Do not wear lotions, powders, perfumes/colognes, or deodorant.            Do not shave 48 hours prior to surgery.  Men may shave face and neck.            Do not bring valuables to the hospital.            [facility] is not responsible for any belongings or valuables.  Do NOT Smoke (Tobacco/Vaping) 24 hours prior to your procedure If you use a CPAP at night, you may bring all equipment for your overnight stay.   Contacts, glasses, dentures or  bridgework may not be worn into surgery.      For patients admitted to the hospital, discharge time will be determined by your treatment team.   Patients discharged the day of surgery will not be allowed to drive home, and someone needs to stay with them for 24 hours.    Special instructions:   [facility]- Preparing For Surgery  Before surgery, you can play an important role. Because skin is not sterile, your skin needs to be as free of germs as possible. You can reduce the number of germs on your skin by washing with CHG (chlorahexidine gluconate) Soap before surgery.  CHG is an antiseptic cleaner which kills germs and bonds with the skin to continue killing germs even after washing.    Oral Hygiene is also important to reduce your risk of infection.  Remember - BRUSH YOUR TEETH THE MORNING OF SURGERY WITH YOUR REGULAR TOOTHPASTE  Please do not use if you have an allergy to CHG or antibacterial soaps. If your skin becomes reddened/irritated stop using the CHG.  Do not shave (including legs and underarms) for at least 48 hours prior to first CHG shower. It is OK to shave your face.  Please follow  these instructions carefully.   Shower the NIGHT BEFORE SURGERY and the MORNING OF SURGERY with DIAL Soap.   Pat yourself dry with a CLEAN TOWEL.  Wear CLEAN PAJAMAS to bed the night before surgery  Place CLEAN SHEETS on your bed the night of your first shower and DO NOT SLEEP WITH PETS.   Day of Surgery: Please shower morning of surgery  Wear Clean/Comfortable clothing the morning of surgery Do not apply any deodorants/lotions.   Remember to brush your teeth WITH YOUR REGULAR TOOTHPASTE.   Questions were answered. Patient verbalized understanding of instructions.

## 2023-05-09 ENCOUNTER — Other Ambulatory Visit: Payer: Self-pay

## 2023-05-09 ENCOUNTER — Encounter (HOSPITAL_COMMUNITY): Payer: Self-pay | Admitting: Obstetrics & Gynecology

## 2023-05-09 ENCOUNTER — Ambulatory Visit (HOSPITAL_COMMUNITY)
Admission: RE | Admit: 2023-05-09 | Discharge: 2023-05-09 | Disposition: A | Discharge status: Home / Self Care | Payer: BC Managed Care – PPO | Attending: Obstetrics & Gynecology | Admitting: Obstetrics & Gynecology

## 2023-05-09 ENCOUNTER — Ambulatory Visit (HOSPITAL_COMMUNITY): Payer: BC Managed Care – PPO | Admitting: Anesthesiology

## 2023-05-09 ENCOUNTER — Encounter (HOSPITAL_COMMUNITY)
Admission: RE | Disposition: A | Discharge status: Home / Self Care | Payer: Self-pay | Source: Home / Self Care | Attending: Obstetrics & Gynecology

## 2023-05-09 DIAGNOSIS — N939 Abnormal uterine and vaginal bleeding, unspecified: Secondary | ICD-10-CM | POA: Insufficient documentation

## 2023-05-09 HISTORY — PX: DILATION AND EVACUATION: SHX1459

## 2023-05-09 SURGERY — DILATION AND EVACUATION, UTERUS
Anesthesia: General | Site: Vagina

## 2023-05-09 MED ORDER — OXYCODONE HCL 5 MG PO TABS: 5.0000 mg | ORAL_TABLET | Freq: Once | ORAL | Status: DC | PRN

## 2023-05-09 MED ORDER — 0.9 % SODIUM CHLORIDE (POUR BTL) OPTIME
TOPICAL | Status: DC | PRN
  Administered 2023-05-09: 1000 mL

## 2023-05-09 MED ORDER — KETOROLAC TROMETHAMINE 15 MG/ML IJ SOLN
INTRAMUSCULAR | Status: DC | PRN
  Administered 2023-05-09: 15 mg via INTRAVENOUS

## 2023-05-09 MED ORDER — LIDOCAINE 2% (20 MG/ML) 5 ML SYRINGE
INTRAMUSCULAR | Status: AC
  Filled 2023-05-09: qty 5

## 2023-05-09 MED ORDER — CHLORHEXIDINE GLUCONATE 0.12 % MT SOLN
15.0000 mL | Freq: Once | OROMUCOSAL | Status: AC
  Administered 2023-05-09: 15 mL via OROMUCOSAL
  Filled 2023-05-09: qty 15

## 2023-05-09 MED ORDER — SCOPOLAMINE 1 MG/3DAYS TD PT72
1.0000 | MEDICATED_PATCH | Freq: Once | TRANSDERMAL | Status: DC
  Administered 2023-05-09: 1.5 mg via TRANSDERMAL
  Filled 2023-05-09: qty 1

## 2023-05-09 MED ORDER — ACETAMINOPHEN 500 MG PO TABS
1000.0000 mg | ORAL_TABLET | Freq: Once | ORAL | Status: AC
Start: 2023-05-09 — End: 2023-05-09
  Administered 2023-05-09: 1000 mg via ORAL
  Filled 2023-05-09: qty 2

## 2023-05-09 MED ORDER — DROPERIDOL 2.5 MG/ML IJ SOLN
0.6250 mg | Freq: Once | INTRAMUSCULAR | Status: DC | PRN
Start: 2023-05-09 — End: 2023-05-11

## 2023-05-09 MED ORDER — MIDAZOLAM HCL 2 MG/2ML IJ SOLN
INTRAMUSCULAR | Status: AC
  Filled 2023-05-09: qty 2

## 2023-05-09 MED ORDER — PROPOFOL 10 MG/ML IV BOLUS
INTRAVENOUS | Status: AC
  Filled 2023-05-09: qty 20

## 2023-05-09 MED ORDER — BUPIVACAINE HCL (PF) 0.5 % IJ SOLN
INTRAMUSCULAR | Status: DC | PRN
  Administered 2023-05-09: 10 mL

## 2023-05-09 MED ORDER — DEXAMETHASONE SODIUM PHOSPHATE 10 MG/ML IJ SOLN
INTRAMUSCULAR | Status: AC
  Filled 2023-05-09: qty 1

## 2023-05-09 MED ORDER — DEXAMETHASONE SODIUM PHOSPHATE 10 MG/ML IJ SOLN
INTRAMUSCULAR | Status: DC | PRN
  Administered 2023-05-09: 10 mg via INTRAVENOUS

## 2023-05-09 MED ORDER — LIDOCAINE 2% (20 MG/ML) 5 ML SYRINGE
INTRAMUSCULAR | Status: DC | PRN
  Administered 2023-05-09: 100 mg via INTRAVENOUS

## 2023-05-09 MED ORDER — FENTANYL CITRATE (PF) 250 MCG/5ML IJ SOLN
INTRAMUSCULAR | Status: DC | PRN
  Administered 2023-05-09: 100 ug via INTRAVENOUS
  Administered 2023-05-09: 50 ug via INTRAVENOUS

## 2023-05-09 MED ORDER — ORAL CARE MOUTH RINSE: 15.0000 mL | Freq: Once | OROMUCOSAL | Status: AC

## 2023-05-09 MED ORDER — ONDANSETRON HCL 4 MG/2ML IJ SOLN
INTRAMUSCULAR | Status: AC
  Filled 2023-05-09: qty 2

## 2023-05-09 MED ORDER — POVIDONE-IODINE 10 % EX SWAB
2.0000 | Freq: Once | CUTANEOUS | Status: AC
  Administered 2023-05-09: 2 via TOPICAL

## 2023-05-09 MED ORDER — OXYCODONE HCL 5 MG/5ML PO SOLN: 5.0000 mg | Freq: Once | ORAL | Status: DC | PRN

## 2023-05-09 MED ORDER — OXYCODONE-ACETAMINOPHEN 5-325 MG PO TABS: 1.0000 | ORAL_TABLET | Freq: Four times a day (QID) | ORAL | 0 refills | Status: DC | PRN

## 2023-05-09 MED ORDER — MIDAZOLAM HCL 2 MG/2ML IJ SOLN
INTRAMUSCULAR | Status: DC | PRN
  Administered 2023-05-09: 2 mg via INTRAVENOUS

## 2023-05-09 MED ORDER — FENTANYL CITRATE (PF) 250 MCG/5ML IJ SOLN
INTRAMUSCULAR | Status: AC
  Filled 2023-05-09: qty 5

## 2023-05-09 MED ORDER — OXYCODONE-ACETAMINOPHEN 5-325 MG PO TABS: 1.0000 | ORAL_TABLET | ORAL | 0 refills | Status: DC | PRN

## 2023-05-09 MED ORDER — ONDANSETRON HCL 4 MG/2ML IJ SOLN
INTRAMUSCULAR | Status: DC | PRN
  Administered 2023-05-09: 4 mg via INTRAVENOUS

## 2023-05-09 MED ORDER — DOXYCYCLINE HYCLATE 100 MG IV SOLR
200.0000 mg | INTRAVENOUS | Status: AC
  Administered 2023-05-09: 200 mg via INTRAVENOUS
  Filled 2023-05-09: qty 200

## 2023-05-09 MED ORDER — SODIUM CHLORIDE 0.9 % IV SOLN: INTRAVENOUS | Status: DC

## 2023-05-09 MED ORDER — PROPOFOL 10 MG/ML IV BOLUS
INTRAVENOUS | Status: DC | PRN
  Administered 2023-05-09: 200 mg via INTRAVENOUS

## 2023-05-09 MED ORDER — KETOROLAC TROMETHAMINE 30 MG/ML IJ SOLN
INTRAMUSCULAR | Status: AC
  Filled 2023-05-09: qty 1

## 2023-05-09 MED ORDER — FENTANYL CITRATE (PF) 100 MCG/2ML IJ SOLN: 25.0000 ug | INTRAMUSCULAR | Status: DC | PRN

## 2023-05-09 MED ORDER — DEXTROSE-SODIUM CHLORIDE 5-0.45 % IV SOLN: INTRAVENOUS | Status: DC

## 2023-05-09 SURGICAL SUPPLY — 19 items
CATH ROBINSON RED A/P 16FR (CATHETERS) ×1 IMPLANT
FILTER UTR ASPR ASSEMBLY (MISCELLANEOUS) ×1 IMPLANT
GLOVE BIO SURGEON STRL SZ 6.5 (GLOVE) ×1 IMPLANT
GLOVE BIOGEL PI IND STRL 7.0 (GLOVE) ×2 IMPLANT
GOWN STRL REUS W/ TWL LRG LVL3 (GOWN DISPOSABLE) ×2 IMPLANT
HOSE CONNECTING 18IN BERKELEY (TUBING) ×1 IMPLANT
KIT BERKELEY 1ST TRI 3/8 NO TR (MISCELLANEOUS) ×1 IMPLANT
KIT BERKELEY 1ST TRIMESTER 3/8 (MISCELLANEOUS) ×1 IMPLANT
NS IRRIG 1000ML POUR BTL (IV SOLUTION) ×1 IMPLANT
PACK VAGINAL MINOR WOMEN LF (CUSTOM PROCEDURE TRAY) ×1 IMPLANT
PAD OB MATERNITY 4.3X12.25 (PERSONAL CARE ITEMS) ×1 IMPLANT
SET BERKELEY SUCTION TUBING (SUCTIONS) IMPLANT
SPIKE FLUID TRANSFER (MISCELLANEOUS) IMPLANT
TOWEL GREEN STERILE FF (TOWEL DISPOSABLE) ×2 IMPLANT
UNDERPAD 30X36 HEAVY ABSORB (UNDERPADS AND DIAPERS) ×1 IMPLANT
VACURETTE 10 RIGID CVD (CANNULA) IMPLANT
VACURETTE 7MM CVD STRL WRAP (CANNULA) IMPLANT
VACURETTE 8 RIGID CVD (CANNULA) IMPLANT
VACURETTE 9 RIGID CVD (CANNULA) IMPLANT

## 2023-05-09 NOTE — Op Note (Signed)
[name] PROCEDURE DATE: 05/09/2023  PREOPERATIVE DIAGNOSIS: retained placenta postpartum POSTOPERATIVE DIAGNOSIS: The same PROCEDURE:     Dilation and Evacuation SURGEON:  Scheryl Darter MD  INDICATIONS: 37 y.o. [phone] with retained placental tissue at 4 weeks postpartum after vaginal delivery, needing surgical completion.  Risks of surgery were discussed with the patient including but not limited to: bleeding which may require transfusion; infection which may require antibiotics; injury to uterus or surrounding organs; need for additional procedures including laparotomy or laparoscopy; possibility of intrauterine scarring which may impair future fertility; and other postoperative/anesthesia complications. Written informed consent was obtained.    FINDINGS:  A 10 week size uterus, moderate amounts of products of conception, specimen sent to pathology.  ANESTHESIA:    Monitored intravenous sedation, paracervical block. INTRAVENOUS FLUIDS:  500 ml of LR ESTIMATED BLOOD LOSS:  Less than 20 ml. SPECIMENS:  Products of conception sent to pathology COMPLICATIONS:  None immediate.  PROCEDURE DETAILS:  The patient received intravenous Doxycycline while in the preoperative area.  She was then taken to the operating room where monitored intravenous sedation was administered and was found to be adequate.  After an adequate timeout was performed, she was placed in the dorsal lithotomy position and examined; then prepped and draped in the sterile manner.   Her bladder was catheterized for an unmeasured amount of clear, yellow urine. A vaginal speculum was then placed in the patient's vagina and a single tooth tenaculum was applied to the anterior lip of the cervix.  A paracervical block using 10 ml of 0.5% Marcaine was administered. The cervix was gently dilated to accommodate a 10 mm suction curette that was gently advanced to the uterine fundus.  The suction device was then activated and curette slowly  rotated to clear the uterus of products of conception.  There was minimal bleeding noted and the tenaculum removed with good hemostasis noted.   All instruments were removed from the patient's vagina.  Sponge and instrument counts were correct times two  The patient tolerated the procedure well and was taken to the recovery area awake, and in stable condition.  Adam Phenix, MD 05/09/2023 1:10 PM

## 2023-05-09 NOTE — Anesthesia Postprocedure Evaluation (Signed)
Anesthesia Post Note  Patient: [name]  Procedure(s) Performed: DILATATION AND EVACUATION (Vagina )     Patient location during evaluation: PACU Anesthesia Type: General Level of consciousness: awake and alert Pain management: pain level controlled Vital Signs Assessment: post-procedure vital signs reviewed and stable Respiratory status: spontaneous breathing, nonlabored ventilation and respiratory function stable Cardiovascular status: blood pressure returned to baseline Postop Assessment: no apparent nausea or vomiting Anesthetic complications: no   No notable events documented.  Last Vitals:  Vitals:   05/09/23 1345 05/09/23 1400  BP: 130/88 127/87  Pulse: 81 80  Resp: 17 16  Temp:    SpO2: 95% 97%    Last Pain:  Vitals:   05/09/23 1345  TempSrc:   PainSc: Asleep                 Shanda Howells

## 2023-05-09 NOTE — Anesthesia Preprocedure Evaluation (Addendum)
Anesthesia Evaluation  Patient identified by MRN, date of birth, ID band Patient awake    Reviewed: Allergy & Precautions, NPO status , Patient's Chart, lab work & pertinent test results  History of Anesthesia Complications Negative for: history of anesthetic complications  Airway Mallampati: II  TM Distance: >3 FB Neck ROM: Full    Dental no notable dental hx.    Pulmonary neg pulmonary ROS   Pulmonary exam normal        Cardiovascular hypertension, Normal cardiovascular exam     Neuro/Psych negative neurological ROS     GI/Hepatic negative GI ROS, Neg liver ROS,,,  Endo/Other  BMI 38  Renal/GU negative Renal ROS  negative genitourinary   Musculoskeletal negative musculoskeletal ROS (+)    Abdominal   Peds  Hematology  (+) Blood dyscrasia (Hgb 10.9), anemia   Anesthesia Other Findings Day of surgery medications reviewed with patient.  Reproductive/Obstetrics negative OB ROS                             Anesthesia Physical Anesthesia Plan  ASA: 2  Anesthesia Plan: General   Post-op Pain Management: Tylenol PO (pre-op)* and Toradol IV (intra-op)*   Induction: Intravenous  PONV Risk Score and Plan: 3 and Ondansetron, Dexamethasone, Treatment may vary due to age or medical condition and Midazolam  Airway Management Planned: LMA  Additional Equipment: None  Intra-op Plan:   Post-operative Plan: Extubation in OR  Informed Consent: I have reviewed the patients History and Physical, chart, labs and discussed the procedure including the risks, benefits and alternatives for the proposed anesthesia with the patient or authorized representative who has indicated his/her understanding and acceptance.     Dental advisory given  Plan Discussed with: CRNA  Anesthesia Plan Comments:        Anesthesia Quick Evaluation

## 2023-05-09 NOTE — Transfer of Care (Signed)
Immediate Anesthesia Transfer of Care Note  Patient: [name]  Procedure(s) Performed: DILATATION AND EVACUATION (Vagina )  Patient Location: PACU  Anesthesia Type:General  Level of Consciousness: awake, alert , oriented, and patient cooperative  Airway & Oxygen Therapy: Patient Spontanous Breathing and Patient connected to nasal cannula oxygen  Post-op Assessment: Report given to RN and Post -op Vital signs reviewed and stable  Post vital signs: Reviewed and stable  Last Vitals:  Vitals Value Taken Time  BP 128/85 05/09/23 1256  Temp 37.1 C 05/09/23 1256  Pulse 102 05/09/23 1257  Resp 26 05/09/23 1257  SpO2 98 % 05/09/23 1257  Vitals shown include unfiled device data.  Last Pain:  Vitals:   05/09/23 1156  TempSrc:   PainSc: 0-No pain         Complications: No notable events documented.

## 2023-05-09 NOTE — H&P (Signed)
Chief Complaint:  Vaginal Bleeding and Abdominal Pain    Event Date/Time   First Provider Initiated Contact with Patient 05/06/23 2203         HPI: [name] is a 37 y.o. G3P1011 who presents to maternity admissions reporting heavy vaginal bleeding and uterine cramping.  Had a vaginal delivery 3.5 wks ago with subsequent treatment for endometritis a week later.  Followup US in office showed retained tissue. Dr Adrian Blackwater is scheduling a hysteroscopy and D&C sometime this week. . She reports vaginal bleeding, denies urinary symptoms, h/a, dizziness, n/v, or fever/chills.     After arrival here, she passed a white mass, approximately 3-4cm around, appears to be a double fibroid.   Vaginal Bleeding The patient's primary symptoms include pelvic pain and vaginal bleeding. The patient's pertinent negatives include no genital itching. The problem has been unchanged. The pain is moderate. She is not pregnant. Associated symptoms include abdominal pain. Pertinent negatives include no chills. The vaginal discharge was bloody. The vaginal bleeding is heavier than menses. She has been passing clots. She has been passing tissue.  Abdominal Pain This is a recurrent problem. The quality of the pain is cramping. Nothing aggravates the pain. The pain is relieved by Nothing.      RN note: [name] is a 38 y.o. at [redacted]w[redacted]d here in MAU reporting: PP vaginal delivery on 11/22 at [facility] - hard time delivering placenta. Was readmitted to [facility] on thanksgiving for infection in uterus and d/c home that following Sunday. Had follow up with Dr. Adrian Blackwater and U/S - need surgery to see what is left in uterus. Saturday had chills, fatigue, aching bones, increase bleeding and low grade fever. Thought things were subsiding, but today bleeding has increased along with extreme abdominal pain -  sharp/shooting/cramping.    Past Medical History:     Past Medical History:  Diagnosis Date   Endometriosis     Fibroid  04/02/2016    Hx Myomectomy in 2014  [ x ] Review Op Note by MD to evaluate for need for C/S  05/08/2018 Op note reviewed. Per note the uterine incision was transverse. The endometrium was not entered and the fibroid was in the lower uterine segment. Based on this documentation, a vag delivery can be attempted.    Carolyn L. Harraway-Smith, M.D., FACOG      Ovarian cyst     Preeclampsia, third trimester 11/27/2018   Uterine fibroid     Yeast vaginitis 10/09/2018          Past obstetric history:                  OB History  Gravida Para Term Preterm AB Living   3 1 1  0 1 1   SAB IAB Ectopic Multiple Live Births      1 0 0 0 1         # Outcome Date GA Lbr Len/2nd Weight Sex Type Anes PTL Lv  3 Gravida                    2 Term 11/29/18 [redacted]w[redacted]d / 03:13 3150 g F Vag-Spont EPI   LIV     Birth Comments: WDL  1 SAB                        Past Surgical History:      Past Surgical History:  Procedure Laterality Date   APPENDECTOMY   03/15/11  ROBOT ASSISTED MYOMECTOMY N/A 12/11/2012    Procedure: ROBOTIC ASSISTED MYOMECTOMY;  Surgeon: Serita Kyle, MD;  Location: [facility];  Service: Gynecology;  Laterality: N/A;          Family History:      Family History  Problem Relation Age of Onset   Cancer Maternal Aunt          pt unaware of what kind   Cancer Maternal Grandmother          pt unawaure of what kinf   Asthma Neg Hx     COPD Neg Hx     Diabetes Neg Hx     Heart disease Neg Hx     Hypertension Neg Hx            Social History: Social History  Social History        Tobacco Use   Smoking status: Never   Smokeless tobacco: Never  Vaping Use   Vaping status: Never Used  Substance Use Topics   Alcohol use: No   Drug use: No        Allergies:  Allergies  No Known Allergies     Meds:         Medications Prior to Admission  Medication Sig Dispense Refill Last Dose/Taking   ferrous sulfate 325 (65 FE) MG EC tablet Take 1 tablet (325 mg total) by  mouth every other day. 45 tablet 1 05/06/2023   acetaminophen (TYLENOL) 500 MG tablet Take by mouth.         amoxicillin-clavulanate (AUGMENTIN) 875-125 MG tablet Take 1 tablet by mouth 2 (two) times daily. 20 tablet 1     aspirin 81 MG chewable tablet Chew 2 tablets (162 mg total) by mouth daily. (Patient not taking: Reported on 04/25/2023) 180 tablet 3     ibuprofen (ADVIL) 600 MG tablet Take by mouth.         ondansetron (ZOFRAN-ODT) 4 MG disintegrating tablet Take 1 tablet (4 mg total) by mouth every 6 (six) hours as needed for nausea. (Patient not taking: Reported on 02/20/2023) 20 tablet 3     Prenatal Vit-Fe Fumarate-FA (PRENATAL VITAMINS PO) Take by mouth.                I have reviewed patient's Past Medical Hx, Surgical Hx, Family Hx, Social Hx, medications and allergies.   ROS:  Review of Systems  Constitutional:  Negative for chills.  Gastrointestinal:  Positive for abdominal pain.  Genitourinary:  Positive for pelvic pain and vaginal bleeding.    Other systems negative       Physical Exam  Patient Vitals for the past 24 hrs:   BP Temp Temp src Pulse Resp SpO2 Height Weight  05/06/23 2202 129/86 99.1 F (37.3 C) Oral 89 19 -- -- --  05/06/23 2115 137/88 99.7 F (37.6 C) Oral 96 19 100 % 5\' 3"  (1.6 m) 97.9 kg    Constitutional: Well-developed, well-nourished female in no acute distress.  Cardiovascular: normal rate Respiratory: normal effort, no distress.  GI: Abd soft, non-tender.  Nondistended.  No rebound, No guarding.   MS: Extremities nontender, no edema, normal ROM Neurologic: Alert and oriented x 4.   Grossly nonfocal. GU: Neg CVAT. Skin:  Warm and Dry Psych:  Affect appropriate.   PELVIC EXAM: Moderate amount vaginal bleeding  Internal exam deferred in lieu of ultrasound     Labs: Lab Results Last 24 Hours        Results for orders  placed or performed during the hospital encounter of 05/06/23 (from the past 24 hours)  Sample to Blood Bank     Status:  None    Collection Time: 05/06/23  9:53 PM  Result Value Ref Range    Blood Bank Specimen SAMPLE AVAILABLE FOR TESTING      Sample Expiration          05/09/2023,2359 Performed at [facility], [address] [place], [place] [address]    CBC     Status: Abnormal    Collection Time: 05/06/23  9:57 PM  Result Value Ref Range    WBC 11.8 (H) 4.0 - 10.5 K/uL    RBC 3.87 3.87 - 5.11 MIL/uL    Hemoglobin 10.9 (L) 12.0 - 15.0 g/dL    HCT 62.1 (L) 30.8 - 46.0 %    MCV 89.4 80.0 - 100.0 fL    MCH 28.2 26.0 - 34.0 pg    MCHC 31.5 30.0 - 36.0 g/dL    RDW 65.7 84.6 - 96.2 %    Platelets 413 (H) 150 - 400 K/uL    nRBC 0.0 0.0 - 0.2 %      Last Hemoglobin on 04/21/23 was 9.2   O/Positive/-- (05/09 1432)  Imaging:  US PELVIC COMPLETE WITH TRANSVAGINAL Result Date: 05/07/2023 CLINICAL DATA:  Vaginal delivery 04/12/2023 with persistent postnatal hemorrhage and pelvic pain. Possible retained products of conception noted on prior sonogram of 04/27/2023. Recurrent pelvic pain and vaginal bleeding EXAM: TRANSABDOMINAL AND TRANSVAGINAL ULTRASOUND OF PELVIS TECHNIQUE: Both transabdominal and transvaginal ultrasound examinations of the pelvis were performed. Transabdominal technique was performed for global imaging of the pelvis including uterus, ovaries, adnexal regions, and pelvic cul-de-sac. It was necessary to proceed with endovaginal exam following the transabdominal exam to visualize the endometrium and ovaries bilaterally. COMPARISON:  04/27/2023, 02/20/2023, 04/27/2021 FINDINGS: Uterus Measurements: 15.2 x 9.1 x 10.8 cm = volume: 788 mL. The uterus is anteverted. The cervix is incompletely visualized, however, the visualized portion is closed and is unremarkable. No intramural intrauterine masses are identified. Endometrium Thickness: 5.3 cm. Similar prior examination is heterogeneous echogenic soft tissue within the endometrial cavity measuring 6.0 x 5.2 x 5.9 cm in keeping with probable  retained products of conception. Right ovary Measurements: 4.6 x 3.7 x 3.2 cm = volume: 29 mL. Normal appearance/no adnexal mass. Left ovary Measurements: 3.2 x 2.6 x 3.6 cm = volume: 16 mL. Normal appearance/no adnexal mass. Other findings No abnormal free fluid. IMPRESSION: 1. Persistent heterogeneous echogenic soft tissue within the endometrial cavity measuring 6.0 x 5.2 x 5.9 cm in keeping with probable retained products of conception. Electronically Signed   By: Helyn Numbers M.D.   On: 05/07/2023 00:15        MAU Course/MDM: I have reviewed the triage vital signs and the nursing notes.   Pertinent labs & imaging results that were available during my care of the patient were reviewed by me and considered in my medical decision making (see chart for details).      I have reviewed her medical records including past results, notes and treatments.    I have ordered labs as follows:  CBC showed Hgb higher than last measurement  Imaging ordered: US showed retained products.  Results reviewed.   Consult Dr Para March who also reviewed US findings.  She recommends continuing plan for surgery and recommended messaging the office to that effect. .   Treatments in MAU included Ibuprofen for cramping.   Pt stable at  time of discharge.   Assessment: Postpartum Day #25 Retained products of conception with stable hemoglobin Fibroid expulsion (presumed fibroid) Vaginal bleeding Pelvic cramping   Plan: Discharge home Recommend schedule surgery per office this week Ibuprofen prn pain Message sent to office to schedule surgery (if not already done) Bleeding precautions   Encouraged to return here or to other Urgent Care/ED if she develops worsening of symptoms, increase in pain, fever, or other concerning symptoms.    Wynelle Bourgeois CNM, MSN Certified Nurse-Midwife 05/06/2023 10:03 PM  Patient presents for suction dilation and curettage with possible hysteroscopy for retained placenta. The risks  of surgery were discussed in detail with the patient including but not limited to: bleeding which may require transfusion or reoperation; infection which may require prolonged hospitalization or re-hospitalization and antibiotic therapy; injury to bowel, bladder, ureters and major vessels or other surrounding organs which may lead to other procedures; formation of adhesions; need for additional procedures including laparotomy or subsequent procedures secondary to intraoperative injury or abnormal pathology; thromboembolic phenomenon; incisional problems and other postoperative or anesthesia complications.  Patient was told that the likelihood that her condition and symptoms will be treated effectively with this surgical management was very high; the postoperative expectations were also discussed in detail. The patient also understands the alternative treatment options which were discussed in full. All questions were answered.    Adam Phenix, MD 05/09/2023 11:50 AM

## 2023-05-09 NOTE — Anesthesia Procedure Notes (Signed)
Procedure Name: LMA Insertion Date/Time: 05/09/2023 12:16 PM  Performed by: Ammie Dalton, CRNAPre-anesthesia Checklist: Patient identified, Emergency Drugs available, Suction available and Patient being monitored Patient Re-evaluated:Patient Re-evaluated prior to induction Oxygen Delivery Method: Circle System Utilized Preoxygenation: Pre-oxygenation with 100% oxygen Induction Type: IV induction Ventilation: Mask ventilation without difficulty LMA: LMA inserted LMA Size: 4.0 Number of attempts: 1 Airway Equipment and Method: Bite block Placement Confirmation: positive ETCO2 Dental Injury: Teeth and Oropharynx as per pre-operative assessment

## 2023-05-10 ENCOUNTER — Encounter (HOSPITAL_COMMUNITY): Payer: Self-pay | Admitting: Obstetrics & Gynecology

## 2023-05-14 LAB — SURGICAL PATHOLOGY

## 2023-05-17 ENCOUNTER — Encounter: Payer: Self-pay | Admitting: Family Medicine

## 2023-05-28 ENCOUNTER — Ambulatory Visit: Payer: BC Managed Care – PPO

## 2023-05-28 DIAGNOSIS — Z30430 Encounter for insertion of intrauterine contraceptive device: Secondary | ICD-10-CM | POA: Diagnosis not present

## 2023-05-28 DIAGNOSIS — Z3202 Encounter for pregnancy test, result negative: Secondary | ICD-10-CM | POA: Diagnosis not present

## 2023-05-28 LAB — POCT URINE PREGNANCY: Preg Test, Ur: NEGATIVE

## 2023-05-28 MED ORDER — LEVONORGESTREL 20.1 MCG/DAY IU IUD
1.0000 | INTRAUTERINE_SYSTEM | Freq: Once | INTRAUTERINE | Status: AC
  Administered 2023-05-28: 1 via INTRAUTERINE

## 2023-05-28 NOTE — Progress Notes (Signed)
    GYNECOLOGY OFFICE PROCEDURE NOTE  [name] is a 38 y.o. G3P1011 here for Liletta  IUD insertion during PP visit. No concerns. Reports she was sexually active 5 days ago.  UPT negative.   IUD Insertion Procedure Note IUD: Liletta   Exp: 01/2027  Lot: [id number]  Patient identified, informed consent performed, consent signed.   Discussed risks of irregular bleeding, cramping, infection, malpositioning or misplacement of the IUD outside the uterus which may require further procedure such as laparoscopy. Further informed that with history of fibroids, expulsion risks and misplacement, during insertion, more likely. Time out was performed.  Urine pregnancy test negative.  Speculum placed in the vagina and cervix visualized.  Cervix and vaginal walls cleaned x 2 with betadine  solution. Anterior aspect grasped with a single tooth tenaculum.  Uterus sounded to 6 cm.  Liletta  IUD placed per manufacturer's recommendations.  Strings trimmed to ~3 cm. Tenaculum was removed, good hemostasis noted.  Patient tolerated procedure well.   Patient was given post-procedure instructions.  She was advised to avoid sexual activity or use barrier method for one week to reduce risk of infection.   Patient also instructed to follow up in 4 weeks for IUD check and call and/or report any issues prior to next visit.  Harlene LITTIE Duncans, CNM 05/28/2023

## 2023-05-28 NOTE — Patient Instructions (Signed)

## 2023-05-28 NOTE — Progress Notes (Signed)
 Post Partum Visit Note  [name] is a 38 y.o. G21P1011 female who presents for a postpartum visit. She is 6 weeks postpartum following a normal spontaneous vaginal delivery.  I have fully reviewed the prenatal and intrapartum course. The delivery was at 38 gestational weeks.  Anesthesia: epidural. Postpartum course has been . Baby is doing well. Baby is feeding by breast. Bleeding staining only. Bowel function is normal. Bladder function is normal. Patient is sexually active. Contraception method is IUD. Postpartum depression screening: negative.   Patient reports she is doing well and gets support from husband.  She reports some bleeding and has been sexually active and without condom usage.  She states last sex was 6 days ago and was unprotected.  She states she is breastfeeding and it is going wonderful. She denies pain or feelings of depression.   The pregnancy intention screening data noted above was reviewed. Potential methods of contraception were discussed. The patient elected to proceed with No data recorded.   Edinburgh Postnatal Depression Scale - 05/28/23 1324       Edinburgh Postnatal Depression Scale:  In the Past 7 Days   I have been able to laugh and see the funny side of things. 0    I have looked forward with enjoyment to things. 0    I have blamed myself unnecessarily when things went wrong. 0    I have been anxious or worried for no good reason. 0    I have felt scared or panicky for no good reason. 0    Things have been getting on top of me. 0    I have been so unhappy that I have had difficulty sleeping. 0    I have felt sad or miserable. 0    I have been so unhappy that I have been crying. 0    The thought of harming myself has occurred to me. 0    Edinburgh Postnatal Depression Scale Total 0             Health Maintenance Due  Topic Date Due   INFLUENZA VACCINE  12/20/2022   COVID-19 Vaccine (1 - 2024-25 season) Never done    The following  portions of the patient's history were reviewed and updated as appropriate: allergies, current medications, past family history, past medical history, past social history, past surgical history, and problem list.  Review of Systems Pertinent items noted in HPI and remainder of comprehensive ROS otherwise negative.  Objective:  BP 129/88 (BP Location: Left Arm, Patient Position: Sitting, Cuff Size: Normal)   Pulse 88   Wt 216 lb (98 kg)   LMP 07/09/2022   Breastfeeding Yes   BMI 38.26 kg/m    General:  alert, cooperative, and no distress   Breasts:  Not Evaluated  Lungs: No apparent distress  Heart:  regular rate and rhythm  Abdomen: normal findings: soft, non-tender   Wound No  GU exam:  normal       Assessment:  7 postpartum exam.  Normal Involution Desires IUD Plan:   -Congratulations given. -Reviewed sexual activity after delivery and with breastfeeding. Encouraged usage of lubrication. -Okay to return to work when desired.  -IUD inserted. See separate note.  -RTO yearly for annual exam or as needed.  -Problem list reviewed and resolution of problems as appropriate.     Essential components of care per ACOG recommendations:  1.  Mood and well being: Patient with positive depression screening today. Reviewed local resources  for support.  - Patient tobacco use? No.   - hx of drug use? No.    2. Infant care and feeding:  -Patient currently breastmilk feeding? Yes. Reviewed importance of draining breast regularly to support lactation.  -Social determinants of health (SDOH) reviewed in EPIC. No concerns  3. Sexuality, contraception and birth spacing - Patient does not want a pregnancy in the next year.  Desired family size is unsure if she desires more children.  - Reviewed reproductive life planning. Reviewed contraceptive methods based on pt preferences and effectiveness.  Patient desired IUD or IUS today.   - Discussed birth spacing of 18 months  4. Sleep and  fatigue -Encouraged family/partner/community support of 4 hrs of uninterrupted sleep to help with mood and fatigue  5. Physical Recovery  - Discussed patients delivery and complications. She describes her labor as mmh.  Of note, patient delivered at [facility]. She states she had intention of coming to [facility], but lived 45 minutes away and didn't feel movement.  - Patient had a Vaginal, no problems at delivery. Patient had a  laceration, but no concerns. Perineal healing reviewed. Patient expressed understanding - Patient has urinary incontinence? No. - Patient is safe to resume physical and sexual activity  6.  Health Maintenance - HM due items addressed Yes - Last pap smear  Diagnosis  Date Value Ref Range Status  01/11/2022   Final   - Negative for intraepithelial lesion or malignancy (NILM)   Pap smear not done at today's visit.  -Breast Cancer screening indicated? No.   7. Chronic Disease/Pregnancy Condition follow up: None  - PCP follow up  [name], CNM Center for [employer], [facility]

## 2023-05-29 ENCOUNTER — Telehealth: Payer: BC Managed Care – PPO | Admitting: Obstetrics and Gynecology

## 2023-06-25 ENCOUNTER — Ambulatory Visit (INDEPENDENT_AMBULATORY_CARE_PROVIDER_SITE_OTHER): Payer: BC Managed Care – PPO | Admitting: Nurse Practitioner

## 2023-06-25 VITALS — BP 129/86 | HR 97 | Wt 213.0 lb

## 2023-06-25 DIAGNOSIS — Z30431 Encounter for routine checking of intrauterine contraceptive device: Secondary | ICD-10-CM

## 2023-06-25 NOTE — Progress Notes (Signed)
 History:  [name] is a 38 y.o. G3P1011 who presents to clinic today for IUD string check. She denies any GI/GU complaints and is doing well.   The following portions of the patient's history were reviewed and updated as appropriate: allergies, current medications, family history, past medical history, social history, past surgical history and problem list.  Review of Systems:  Review of Systems  All other systems reviewed and are negative.     Objective:  Physical Exam BP 129/86 (BP Location: Left Arm, Patient Position: Sitting, Cuff Size: Normal)   Pulse 97   Wt 213 lb (96.6 kg)   Breastfeeding Yes   BMI 37.73 kg/m  Physical Exam    Subjective:   Patient Name: [name], female   DOB: 1985-07-31, 38 y.o.  MRN: [id number]  Chaperone present in the room during entire exam  HPI Patient here for an IUD check.  She had the Liletta  IUD placed 1 month ago on 05/28/23.  She reports no complaints and is doing well with infant at the bedside.   Review of Systems  Constitutional: Negative for fever and chills.  Gastrointestinal: Negative for abdominal pain.  Genitourinary: Negative for vaginal discharge, vaginal pain, pelvic pain and dyspareunia.        Objective:   Physical Exam  Constitutional: She appears well-developed and well-nourished.  HENT:  Head: Normocephalic and atraumatic.  Abdominal: Soft. There is no tenderness. There is no guarding.  Genitourinary: There is no rash, tenderness or lesion on the right labia. There is no rash, tenderness or lesion on the left labia. No erythema or tenderness in the vagina. No foreign body around the vagina. No signs of injury around the vagina. No vaginal discharge found.    Skin: Skin is warm and dry.  Psychiatric: She has a normal mood and affect. Her behavior is normal. Judgment and thought content normal.       Assessment & Plan:  1. IUD check up (Primary) - Strings visualized   Follow up PRN or for  annual exam.  Olam Dalton, MSN, [facility], Center for [facility]  Dalton Olam LABOR, [place] 06/25/2023 4:36 PM

## 2024-01-31 ENCOUNTER — Ambulatory Visit: Admission: EM | Admit: 2024-01-31 | Discharge: 2024-01-31 | Disposition: A | Discharge status: Home / Self Care

## 2024-01-31 DIAGNOSIS — J302 Other seasonal allergic rhinitis: Secondary | ICD-10-CM

## 2024-01-31 DIAGNOSIS — R030 Elevated blood-pressure reading, without diagnosis of hypertension: Secondary | ICD-10-CM | POA: Diagnosis not present

## 2024-01-31 DIAGNOSIS — J329 Chronic sinusitis, unspecified: Secondary | ICD-10-CM | POA: Diagnosis not present

## 2024-01-31 MED ORDER — DESLORATADINE 5 MG PO TABS: 5.0000 mg | ORAL_TABLET | Freq: Every day | ORAL | 2 refills | Status: AC

## 2024-01-31 MED ORDER — TRIAMCINOLONE ACETONIDE 40 MG/ML IJ SUSP
40.0000 mg | Freq: Once | INTRAMUSCULAR | Status: AC
  Administered 2024-01-31: 40 mg via INTRAMUSCULAR

## 2024-01-31 MED ORDER — MOMETASONE FUROATE 50 MCG/ACT NA SUSP: 2.0000 | Freq: Every day | NASAL | 2 refills | Status: AC

## 2024-01-31 NOTE — ED Triage Notes (Signed)
 For about 4-5 days she has had nasal congestion, sinus pressure, headache, and fatigue. Her sx worsened yesterday. She states she has had allergies in the past.    Home Interventions: Cetirizine

## 2024-01-31 NOTE — ED Provider Notes (Signed)
 [facility]    CSN: [id number] Arrival date & time: 01/31/24  9062    HISTORY   Chief Complaint  Patient presents with   Sinus Pressure   Nasal Congestion   HPI [name] is a pleasant, 38 y.o. female who presents to urgent care today. Patient states that for the past 5 days, she has had progressively worsening nasal and congestion, sinus pressure, sinus headache and fatigue.  Patient denies known sick contacts.  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  Patient reports a history of allergies in the past, started taking Zyrtec a few days ago but this has not seemed to alleviate her symptoms.  Patient states she is going out of town in 2 days and will be flying on an airplane.  Patient has significantly elevated blood pressure on arrival today with otherwise normal vital signs.  Patient has a history of preeclampsia and prediabetes but has never been diagnosed with hypertension.  Most recent recorded blood pressure reading was in February 2025 and was 129/86.  Patient is not currently taking any blood pressure medications.  The history is provided by the patient.   Past Medical History:  Diagnosis Date   Endometriosis    Fibroid 04/02/2016   Hx Myomectomy in 2014  [ x ] Review Op Note by MD to evaluate for need for C/S  05/08/2018 Op note reviewed. Per note the uterine incision was transverse. The endometrium was not entered and the fibroid was in the lower uterine segment. Based on this documentation, a vag delivery can be attempted.    Carolyn L. 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   H/O myomectomy 12/12/2012   Past Surgical History:  Procedure Laterality Date   APPENDECTOMY  03/15/11   DILATION AND EVACUATION N/A 05/09/2023   Procedure: DILATATION AND EVACUATION;  Surgeon: Eveline Lynwood MATSU, MD;  Location: [facility];  Service: Gynecology;  Laterality: N/A;   ROBOT ASSISTED MYOMECTOMY N/A 12/11/2012   Procedure: ROBOTIC ASSISTED MYOMECTOMY;  Surgeon: Dickie DELENA Carder, MD;  Location: [facility] ORS;  Service: Gynecology;  Laterality: N/A;   OB History     Gravida  3   Para  1   Term  1   Preterm  0   AB  1   Living  1      SAB  1   IAB  0   Ectopic  0   Multiple  0   Live Births  2          Home Medications    Prior to Admission medications   Medication Sig Start Date End Date Taking? Authorizing Provider  desloratadine  (CLARINEX ) 5 MG tablet Take 1 tablet (5 mg total) by mouth daily. 01/31/24 04/30/24 Yes Joesph Shaver Scales, PA-C  mometasone  (NASONEX ) 50 MCG/ACT nasal spray Place 2 sprays into the nose daily. 01/31/24 04/30/24 Yes Joesph Shaver Scales, PA-C    Family History Family History  Problem Relation Age of Onset   Cancer Maternal Aunt  pt unaware of what kind   Cancer Maternal Grandmother        pt unawaure of what kinf   Asthma Neg Hx    COPD Neg Hx    Diabetes Neg Hx    Heart disease Neg Hx    Hypertension Neg Hx    Social History Social History   Tobacco Use   Smoking status: Never    Passive exposure: Never   Smokeless tobacco: Never  Vaping Use   Vaping status: Never Used  Substance Use Topics   Alcohol use: No   Drug use: No   Allergies   Patient has no known allergies.  Review of Systems Review of Systems Pertinent findings revealed after performing a 14 point review of systems has been noted in the history of present illness.  Physical Exam Vital Signs BP (!) 158/110 (BP Location: Right Arm)   Pulse 93   Temp 98 F (36.7 C) (Oral)    Resp 18   SpO2 96%   Breastfeeding Yes   No data found.  Physical Exam Vitals and nursing note reviewed.  Constitutional:      General: She is awake. She is not in acute distress.    Appearance: Normal appearance. She is well-developed and well-groomed. She is not ill-appearing.  HENT:     Head: Normocephalic and atraumatic.     Salivary Glands: Right salivary gland is not diffusely enlarged or tender. Left salivary gland is not diffusely enlarged or tender.     Right Ear: Hearing, tympanic membrane, ear canal and external ear normal.     Left Ear: Hearing, tympanic membrane, ear canal and external ear normal.     Nose: Mucosal edema, congestion and rhinorrhea present. Rhinorrhea is clear.     Right Turbinates: Not enlarged, swollen or pale.     Left Turbinates: Not enlarged, swollen or pale.     Right Sinus: No maxillary sinus tenderness or frontal sinus tenderness.     Left Sinus: No maxillary sinus tenderness or frontal sinus tenderness.     Mouth/Throat:     Lips: Pink. No lesions.     Mouth: Mucous membranes are moist. No oral lesions.     Tongue: No lesions. Tongue does not deviate from midline.     Palate: No mass and lesions.     Pharynx: Oropharynx is clear. Uvula midline. Postnasal drip present. No pharyngeal swelling, oropharyngeal exudate, posterior oropharyngeal erythema or uvula swelling.     Tonsils: No tonsillar exudate. 0 on the right. 0 on the left.  Eyes:     General: Lids are normal.        Right eye: No discharge.        Left eye: No discharge.     Conjunctiva/sclera: Conjunctivae normal.     Right eye: Right conjunctiva is not injected.     Left eye: Left conjunctiva is not injected.  Neck:     Trachea: Trachea and phonation normal.  Cardiovascular:     Rate and Rhythm: Normal rate and regular rhythm.  Pulmonary:     Effort: Pulmonary effort is normal.     Breath sounds: Normal breath sounds.  Chest:     Chest wall: No tenderness.  Musculoskeletal:         General: Normal range of motion.     Cervical back: Full passive range of motion without pain, normal range of motion and neck supple. Normal range of motion.  Lymphadenopathy:     Cervical: No cervical  adenopathy.  Skin:    General: Skin is warm and dry.     Findings: No erythema or rash.  Neurological:     General: No focal deficit present.     Mental Status: She is alert and oriented to person, place, and time. Mental status is at baseline.  Psychiatric:        Attention and Perception: Attention and perception normal.        Mood and Affect: Mood and affect normal.        Speech: Speech normal.        Behavior: Behavior normal. Behavior is cooperative.        Thought Content: Thought content normal.     Visual Acuity Right Eye Distance:   Left Eye Distance:   Bilateral Distance:    Right Eye Near:   Left Eye Near:    Bilateral Near:     UC Couse / Diagnostics / Procedures:     Radiology No results found.  Procedures Procedures (including critical care time) EKG  Pending results:  Labs Reviewed - No data to display  Medications Ordered in UC: Medications  triamcinolone  acetonide (KENALOG -40) injection 40 mg (40 mg Intramuscular Given 01/31/24 1006)    UC Diagnoses / Final Clinical Impressions(s)   I have reviewed the triage vital signs and the nursing notes.  Pertinent labs & imaging results that were available during my care of the patient were reviewed by me and considered in my medical decision making (see chart for details).    Final diagnoses:  Rhinosinusitis  Seasonal allergies  Elevated blood pressure reading in office without diagnosis of hypertension   Patient advised physical exam findings are concerning for mild seasonal allergies, possible mild upper respiratory infection.  No concern for influenza COVID at this time as patient is well-appearing.  Patient was provided with prescriptions for Clarinex  and mometasone  for treatment of seasonal  allergies.  Because patient is about to travel via airplane, patient was provided with an injection of Kenalog  to reduce upper respiratory inflammation and prevent possible eustachian tube dysfunction.  Conservative care recommended.  Return precautions advised.  Please see discharge instructions below for details of plan of care as provided to patient. ED Prescriptions     Medication Sig Dispense Auth. Provider   desloratadine  (CLARINEX ) 5 MG tablet Take 1 tablet (5 mg total) by mouth daily. 30 tablet Joesph Shaver Scales, PA-C   mometasone  (NASONEX ) 50 MCG/ACT nasal spray Place 2 sprays into the nose daily. 1 each Joesph Shaver Scales, PA-C      I have reviewed the PDMP during this encounter.  Pending results:  Labs Reviewed - No data to display    Discharge Instructions      Please read below to learn more about the medications, dosages and frequencies that I recommend to help alleviate your symptoms and to get you feeling better soon:   Solu-Medrol IM (methylprednisolone):  To quickly address your significant respiratory inflammation, you were provided with an injection of Solu-Medrol in the office today.  You should continue to feel the full benefit of the steroid for the next 4 to 6 hours.    Clarinex  (loratadine): This is an excellent second-generation antihistamine that helps to reduce respiratory inflammatory response to environmental allergens.  This medication is not known to cause daytime sleepiness so it can be taken in the daytime.  If you find that it does make you sleepy, please feel free to take it at bedtime.   Nasonex  (mometasone ):  This is a steroid nasal spray that used once daily, 1 spray in each nare.  This works best when used on a daily basis. This medication does not work well if it is only used when you think you need it.  After 3 to 5 days of use, you will notice significant reduction of the inflammation and mucus production that is currently being caused by  exposure to allergens, whether seasonal or environmental.  The most common side effect of this medication is nosebleeds.  If you experience a nosebleed, please discontinue use for 1 week, then feel free to resume.   If you find that your insurance will not pay for this medication, please consider a different nasal steroids such as Flonase (fluticasone), or Nasacort  (triamcinolone ).    Thank you for visiting urgent care today.  We appreciate the opportunity to participate in your care.     Disposition Upon Discharge:  Condition: stable for discharge home  Patient presented with an acute illness with associated systemic symptoms and significant discomfort requiring urgent management. In my opinion, this is a condition that a prudent lay person (someone who possesses an average knowledge of health and medicine) may potentially expect to result in complications if not addressed urgently such as respiratory distress, impairment of bodily function or dysfunction of bodily organs.   Routine symptom specific, illness specific and/or disease specific instructions were discussed with the patient and/or caregiver at length.   As such, the patient has been evaluated and assessed, work-up was performed and treatment was provided in alignment with urgent care protocols and evidence based medicine.  Patient/parent/caregiver has been advised that the patient may require follow up for further testing and treatment if the symptoms continue in spite of treatment, as clinically indicated and appropriate.  Patient/parent/caregiver has been advised to return to the [facility] or PCP if no better; to PCP or the Emergency Department if new signs and symptoms develop, or if the current signs or symptoms continue to change or worsen for further workup, evaluation and treatment as clinically indicated and appropriate  The patient will follow up with their current PCP if and as advised. If the patient does not currently have a PCP  we will assist them in obtaining one.   The patient may need specialty follow up if the symptoms continue, in spite of conservative treatment and management, for further workup, evaluation, consultation and treatment as clinically indicated and appropriate.  Patient/parent/caregiver verbalized understanding and agreement of plan as discussed.  All questions were addressed during visit.  Please see discharge instructions below for further details of plan.  This office note has been dictated using Teaching laboratory technician.  Unfortunately, this method of dictation can sometimes lead to typographical or grammatical errors.  I apologize for your inconvenience in advance if this occurs.  Please do not hesitate to reach out to me if clarification is needed.      Joesph Shaver Scales, PA-C 01/31/24 1306

## 2024-01-31 NOTE — Discharge Instructions (Signed)
 Please read below to learn more about the medications, dosages and frequencies that I recommend to help alleviate your symptoms and to get you feeling better soon:   Solu-Medrol IM (methylprednisolone):  To quickly address your significant respiratory inflammation, you were provided with an injection of Solu-Medrol in the office today.  You should continue to feel the full benefit of the steroid for the next 4 to 6 hours.    Clarinex  (loratadine): This is an excellent second-generation antihistamine that helps to reduce respiratory inflammatory response to environmental allergens.  This medication is not known to cause daytime sleepiness so it can be taken in the daytime.  If you find that it does make you sleepy, please feel free to take it at bedtime.   Nasonex  (mometasone ): This is a steroid nasal spray that used once daily, 1 spray in each nare.  This works best when used on a daily basis. This medication does not work well if it is only used when you think you need it.  After 3 to 5 days of use, you will notice significant reduction of the inflammation and mucus production that is currently being caused by exposure to allergens, whether seasonal or environmental.  The most common side effect of this medication is nosebleeds.  If you experience a nosebleed, please discontinue use for 1 week, then feel free to resume.   If you find that your insurance will not pay for this medication, please consider a different nasal steroids such as Flonase (fluticasone), or Nasacort  (triamcinolone ).    Thank you for visiting urgent care today.  We appreciate the opportunity to participate in your care.

## 2024-05-08 ENCOUNTER — Other Ambulatory Visit: Payer: Self-pay

## 2024-05-08 DIAGNOSIS — B379 Candidiasis, unspecified: Secondary | ICD-10-CM

## 2024-05-08 MED ORDER — FLUCONAZOLE 150 MG PO TABS: 150.0000 mg | ORAL_TABLET | Freq: Once | ORAL | 0 refills | Status: AC
